# Patient Record
Sex: Female | Born: 1945 | Race: White | Hispanic: No | Marital: Married | Smoking: Former smoker
Health system: Southern US, Community
[De-identification: ages and names within clinical notes are randomized; demographics above are authoritative.]

## PROBLEM LIST (undated history)

## (undated) DIAGNOSIS — E11319 Type 2 diabetes mellitus with unspecified diabetic retinopathy without macular edema: Secondary | ICD-10-CM

## (undated) DIAGNOSIS — J45909 Unspecified asthma, uncomplicated: Secondary | ICD-10-CM

## (undated) DIAGNOSIS — H3500 Unspecified background retinopathy: Secondary | ICD-10-CM

## (undated) DIAGNOSIS — G459 Transient cerebral ischemic attack, unspecified: Secondary | ICD-10-CM

## (undated) DIAGNOSIS — R251 Tremor, unspecified: Secondary | ICD-10-CM

## (undated) DIAGNOSIS — R0683 Snoring: Secondary | ICD-10-CM

## (undated) DIAGNOSIS — G4751 Confusional arousals: Secondary | ICD-10-CM

## (undated) DIAGNOSIS — F32A Depression, unspecified: Secondary | ICD-10-CM

## (undated) DIAGNOSIS — F329 Major depressive disorder, single episode, unspecified: Secondary | ICD-10-CM

## (undated) DIAGNOSIS — F419 Anxiety disorder, unspecified: Secondary | ICD-10-CM

## (undated) HISTORY — DX: Transient cerebral ischemic attack, unspecified: G45.9

## (undated) HISTORY — DX: Confusional arousals: G47.51

## (undated) HISTORY — DX: Type 2 diabetes mellitus with unspecified diabetic retinopathy without macular edema: E11.319

## (undated) HISTORY — DX: Unspecified asthma, uncomplicated: J45.909

## (undated) HISTORY — DX: Tremor, unspecified: R25.1

## (undated) HISTORY — PX: OTHER SURGICAL HISTORY: SHX169

## (undated) HISTORY — PX: CATARACT EXTRACTION, BILATERAL: SHX1313

## (undated) HISTORY — PX: COLONOSCOPY: SHX174

## (undated) HISTORY — DX: Snoring: R06.83

---

## 1998-03-01 ENCOUNTER — Ambulatory Visit (HOSPITAL_COMMUNITY): Admission: RE | Admit: 1998-03-01 | Discharge: 1998-03-01 | Payer: Self-pay | Admitting: Endocrinology

## 1998-03-24 ENCOUNTER — Other Ambulatory Visit: Admission: RE | Admit: 1998-03-24 | Discharge: 1998-03-24 | Payer: Self-pay | Admitting: *Deleted

## 1999-02-27 ENCOUNTER — Other Ambulatory Visit: Admission: RE | Admit: 1999-02-27 | Discharge: 1999-02-27 | Payer: Self-pay | Admitting: *Deleted

## 1999-05-04 ENCOUNTER — Emergency Department (HOSPITAL_COMMUNITY): Admission: RE | Admit: 1999-05-04 | Discharge: 1999-05-04 | Payer: Self-pay | Admitting: Endocrinology

## 1999-05-04 ENCOUNTER — Encounter: Payer: Self-pay | Admitting: Endocrinology

## 2000-01-23 ENCOUNTER — Encounter: Payer: Self-pay | Admitting: Endocrinology

## 2000-01-23 ENCOUNTER — Ambulatory Visit (HOSPITAL_COMMUNITY): Admission: RE | Admit: 2000-01-23 | Discharge: 2000-01-23 | Payer: Self-pay | Admitting: Endocrinology

## 2000-05-29 ENCOUNTER — Other Ambulatory Visit: Admission: RE | Admit: 2000-05-29 | Discharge: 2000-05-29 | Payer: Self-pay | Admitting: *Deleted

## 2001-03-24 ENCOUNTER — Ambulatory Visit (HOSPITAL_COMMUNITY): Admission: RE | Admit: 2001-03-24 | Discharge: 2001-03-24 | Payer: Self-pay | Admitting: Endocrinology

## 2001-03-24 ENCOUNTER — Encounter: Payer: Self-pay | Admitting: Endocrinology

## 2001-07-24 ENCOUNTER — Encounter (HOSPITAL_BASED_OUTPATIENT_CLINIC_OR_DEPARTMENT_OTHER): Admission: RE | Admit: 2001-07-24 | Discharge: 2001-10-22 | Payer: Self-pay | Admitting: Internal Medicine

## 2002-08-10 ENCOUNTER — Encounter (HOSPITAL_BASED_OUTPATIENT_CLINIC_OR_DEPARTMENT_OTHER): Admission: RE | Admit: 2002-08-10 | Discharge: 2002-10-27 | Payer: Self-pay | Admitting: Internal Medicine

## 2002-08-10 ENCOUNTER — Encounter: Admission: RE | Admit: 2002-08-10 | Discharge: 2002-08-10 | Payer: Self-pay | Admitting: Endocrinology

## 2002-08-10 ENCOUNTER — Encounter: Payer: Self-pay | Admitting: Endocrinology

## 2002-09-29 ENCOUNTER — Observation Stay (HOSPITAL_COMMUNITY): Admission: AD | Admit: 2002-09-29 | Discharge: 2002-09-30 | Payer: Self-pay | Admitting: Endocrinology

## 2002-10-28 ENCOUNTER — Encounter (HOSPITAL_BASED_OUTPATIENT_CLINIC_OR_DEPARTMENT_OTHER): Admission: RE | Admit: 2002-10-28 | Discharge: 2003-01-26 | Payer: Self-pay | Admitting: Internal Medicine

## 2003-07-18 ENCOUNTER — Emergency Department (HOSPITAL_COMMUNITY): Admission: EM | Admit: 2003-07-18 | Discharge: 2003-07-18 | Payer: Self-pay | Admitting: Emergency Medicine

## 2004-01-05 ENCOUNTER — Encounter (HOSPITAL_BASED_OUTPATIENT_CLINIC_OR_DEPARTMENT_OTHER): Admission: RE | Admit: 2004-01-05 | Discharge: 2004-01-17 | Payer: Self-pay | Admitting: Internal Medicine

## 2004-02-25 ENCOUNTER — Other Ambulatory Visit: Admission: RE | Admit: 2004-02-25 | Discharge: 2004-02-25 | Payer: Self-pay | Admitting: Obstetrics and Gynecology

## 2004-08-07 ENCOUNTER — Ambulatory Visit (HOSPITAL_COMMUNITY): Admission: RE | Admit: 2004-08-07 | Discharge: 2004-08-07 | Payer: Self-pay | Admitting: Gastroenterology

## 2004-08-12 ENCOUNTER — Emergency Department (HOSPITAL_COMMUNITY): Admission: EM | Admit: 2004-08-12 | Discharge: 2004-08-12 | Payer: Self-pay | Admitting: Emergency Medicine

## 2004-12-18 ENCOUNTER — Ambulatory Visit (HOSPITAL_COMMUNITY): Admission: RE | Admit: 2004-12-18 | Discharge: 2004-12-18 | Payer: Self-pay | Admitting: Endocrinology

## 2005-01-22 ENCOUNTER — Encounter (HOSPITAL_BASED_OUTPATIENT_CLINIC_OR_DEPARTMENT_OTHER): Admission: RE | Admit: 2005-01-22 | Discharge: 2005-02-02 | Payer: Self-pay | Admitting: Surgery

## 2005-01-23 ENCOUNTER — Ambulatory Visit (HOSPITAL_COMMUNITY): Admission: RE | Admit: 2005-01-23 | Discharge: 2005-01-23 | Payer: Self-pay | Admitting: Internal Medicine

## 2006-07-02 ENCOUNTER — Encounter: Admission: RE | Admit: 2006-07-02 | Discharge: 2006-07-02 | Payer: Self-pay | Admitting: Endocrinology

## 2010-03-04 ENCOUNTER — Encounter: Payer: Self-pay | Admitting: Endocrinology

## 2010-06-30 NOTE — H&P (Signed)
Leslie Mccann, Leslie Mccann                           ACCOUNT NO.:  [id number]   MEDICAL RECORD NO.:  [id number]                   PATIENT TYPE:  INP   LOCATION:  5705                                 FACILITY:  [facility]   PHYSICIAN:  Gwen Pounds, M.D.                 DATE OF BIRTH:  [birth year]   DATE OF ADMISSION:  09/29/2002  DATE OF DISCHARGE:                                HISTORY & PHYSICAL   CHIEF COMPLAINT:  Intractable nausea, vomiting, dehydration, dizziness, and  orthostasis.   HISTORY OF PRESENT ILLNESS:  This is a 65 year old type 1 diabetic female  and other medical issues who presents with 36 hours of dizziness with any  position change and intractable nausea with vomiting, three or four episodes  today.  She has not been able to keep anything down today.  She vomited 30  minutes after a half of sandwich today.  The vomiting was not bilious or  bloody.  Her blood sugars have remained between 100 and 300.  She does not  have fruity breath.  She had pork on Sunday night, no one else got ill off  of it.  She is worried because she was at the beach last week and was having  ear fullness since and dizziness since yesterday a.m.  Again, the dizziness  is especially with any motion changes.  She denies any diarrhea, chest pain,  or other symptoms.  She is brought to the exam room via wheelchair due to  weakness.   PAST MEDICAL HISTORY:  1. Type 1 diabetes.  2. Retinopathy.  3. Neuropathy.  4. Radiation treatments to the right neck to a birthmark.  5. Hypoglycemic seizures.  6. Osteopenia.  7. Adhesive capsulitis.  8. Abnormal LFTs in the past.  9. Depression.  10.      Hyperlipidemia.  11.      History of exploratory laparotomy.  12.      History of a GI bleed.  13.      Goiter.  14.      Anemia.   ALLERGIES:  SULFA.   SOCIAL HISTORY:  She is married.  Does not smoke, does not drink.   FAMILY HISTORY:  COPD, bladder cancer, thyroid cancer.   MEDICATIONS:  1. Lantus  16 h.s.  2. Aspirin 81 daily.  3. Evista 60 daily.  4. Calcium two daily.  5. Paxil CR 25 daily.  6. Tranxene.  7. Multivitamin daily.   REVIEW OF SYSTEMS:  Otherwise negative, except for weakness, dry mouth,  nausea, vomiting, no diarrhea, dizziness, or orthostasis.   PHYSICAL EXAMINATION:  VITAL SIGNS:  Temperature 97.3.  She is orthostatic  with blood pressure at 140/80 laying, with heart rate 80; while sitting,  112/66, with heart rate 76; while standing, blood pressure 110/66, with  heart rate 88.  Blood sugar is 156.  GENERAL:  Alert and oriented x3.  When laying down appears okay.  Dizzy and  holding onto things when sitting up.  HEENT:  Dry mouth.  PERRLA.  EOMI.  Throat and tympanic membranes are clear.  Nasal passages are clear.  NECK:  No JVD, no bruit, no lymphadenopathy or thyromegaly noted.  Scar to  the right neck.  CARDIAC:  Regular without murmurs.  PULMONARY:  Clear to auscultation bilaterally.  SKIN:  Without rash.  ABDOMEN:  Soft, nontender, nondistended, bowel sounds positive.  EXTREMITIES:  No cyanosis, clubbing, or edema.  NEUROLOGIC:  Intact.  No fruity breath noted.  No nystagmus noted.   ASSESSMENT AND PLAN:  This is a type 1 diabetic female with orthostatic  hypotension, nausea, vomiting, and dehydration.   PLAN:  1. Admit for 23 hour observation.  2. Check labs to rule out any underlying disorder and rule out pancreatitis.  3. Hydrate.  4. Antiemetics for settling her stomach.  5. Meclizine for dizziness.  6. Control diabetes.  7. Hopefully home in the morning.  8. Underlying issue may be vertigo versus gastroenteritis versus other.                                                Gwen Pounds, M.D.    JMR/MEDQ  D:  09/29/2002  T:  09/29/2002  Job:  [id number]   cc:   Jeannett Senior A. Evlyn Kanner, M.D.  [address]  [place]  [place] [address]  Fax: [phone]

## 2010-06-30 NOTE — Discharge Summary (Signed)
NAMECASSADY, Leslie Mccann                           ACCOUNT NO.:  [id number]   MEDICAL RECORD NO.:  [id number]                   PATIENT TYPE:  OBV   LOCATION:  5705                                 FACILITY:  [facility]   PHYSICIAN:  Tera Mater. Evlyn Kanner, M.D.              DATE OF BIRTH:  13-Jun-1945   DATE OF ADMISSION:  09/29/2002  DATE OF DISCHARGE:  09/30/2002                                 DISCHARGE SUMMARY   DISCHARGE DIAGNOSES:  1. Orthostatic hypotension due to dehydration, clinically improved.  2. Viral labyrinthitis, clinically much improved.  3. Type 1 diabetes, no evidence of ketoacidosis.  4. Neuropathy.  5. Toe ulcer without acute infection.  6. Retinopathy.  7. Osteopenia.  8. History of abnormal liver function tests now resolved.  9. Dyslipidemia.  10.      Goiter, clinically euthyroid.  11.      Anemia.  12.      History of severe depression.   HOSPITAL COURSE:  Ms. Trammel is a 65 year old white female with a long  history of type 1 diabetes, dating back to 71.  She had 36 hours of  nausea, vomiting, dizziness, and unstable gait.  She was found to be quite  orthostatic and volume depleted.  Her evaluation and treatment here has  included fluid resuscitation and treatment with meclozine and Phenergan.  She has not even needed the meclozine today.  She is feeling back close to  her baseline.  With minimal help, she got to the bathroom with no dizziness  and no fear of falling.  Her gait is much more steady.  Her electrolytes  were in relatively good control with no evidence of significant disarray.  Specifically, there was no evidence of any diabetic ketoacidosis  precipitating this.  This appears to have been a straight forward viral  labyrinthitis complicating a person who already has a history of  longstanding diabetes and its complications.  At the present time, her blood  pressure is still somewhat orthostatically dropping but not to a degree that  it would precipitate  or continue a hospitalization.  I believe, she has  reached maximal hospital benefit and can go home.  Blood pressure today was  114/47 with pulse 77, dropping to 105/55 with pulse of 83 sitting and 89/50  with pulse 89 standing.  Her examination is otherwise unremarkable.  There  are no neurologic deficits on today's examination.  The toe ulcer is small  without an acute infection.  She is tolerating her diet fairly well.  Blood  sugar started today at 111.  It was up a bit more at lunch.   LABORATORY DATA:  White count 8600, hemoglobin 13.0, MCV 98.2, platelets  229,000.  Sodium 133, potassium 3.5, chloride 99, CO2 22, BUN 20, creatinine  0.8, glucose 316.  Total bilirubin 1.0, alkaline phosphatase 97, SGOT 23,  SGPT 26, total protein 6.1, albumin 3.5, calcium  8.7, amylase 60, lipase 13.  No cardiology or radiology tests were done.   In summary, we have a 65 year old white female with known type 1 diabetes  presenting with viral labyrinthitis, orthostatic hypotension and  dehydration.  She is clinically much improved and is ready for discharge  home.  Blood sugars were up just a bit today due to her dose being adjusted  downward.   At home, she is to resume her Lantus 60 units at bedtime and aspirin 81 mg  daily, Evista 60 daily, calcium two pills a day, Paxil CR 25 daily,  Tranxene, multivitamin, sliding scale insulin.  The two medications are  meclozine 25 mg t.i.d. p.r.n. and Phenergan suppository 25 mg q.8h. p.r.n.                                                Tera Mater. Evlyn Kanner, M.D.    SAS/MEDQ  D:  09/30/2002  T:  10/01/2002  Job:  [id number]

## 2010-06-30 NOTE — Op Note (Signed)
Leslie Mccann, Leslie Mccann                 ACCOUNT NO.:  [id number]   MEDICAL RECORD NO.:  [id number]          PATIENT TYPE:  AMB   LOCATION:  ENDO                         [facility]ACILITY:  [facility]   PHYSICIAN:  James L. Malon Kindle., M.D.DATE OF BIRTH:  11-28-1945   DATE OF PROCEDURE:  08/07/2004  DATE OF DISCHARGE:                                 OPERATIVE REPORT   PROCEDURE:  Colonoscopy.   MEDICATIONS:  1.  Fentanyl 100 mcg.  2.  Versed 10 mg IV.   SCOPE:  Pediatric adjustable colonoscope.   INDICATION:  Colon cancer screening.   DESCRIPTION OF PROCEDURE:  The procedure explained to the patient and  consent obtained.  In the left lateral decubitus position, digital exam  performed, pediatric adjustable scope inserted and advanced, prep excellent,  cecum reached, ileocecal valve and appendiceal orifice seen.  Scope  withdrawn, the patient carefully examined.  No polyps throughout.  No  diverticulosis.  Rectum, __________ seen well on retroflexed view.  Small  internal hemorrhoids.  Scope withdrawn.  The patient tolerated the procedure  well.   ASSESSMENT:  Normal screening colonoscopy.  V76.51.   PLAN:  We will recommend yearly Hemoccults, resume her insulin today.  Possible repeat colonoscopy in 10 years.       JLE/MEDQ  D:  08/07/2004  T:  08/07/2004  Job:  [id number]   cc:   Dineen Kid. Rana Snare, M.D.  [address]  [place]  [place] [address]  Fax: [phone]   Tera Mater. Evlyn Kanner, M.D.  [address]  [place]  [place] [address]  Fax: [phone]

## 2011-05-09 ENCOUNTER — Emergency Department (HOSPITAL_COMMUNITY): Payer: Medicare Other

## 2011-05-09 ENCOUNTER — Encounter (HOSPITAL_COMMUNITY): Payer: Self-pay | Admitting: Emergency Medicine

## 2011-05-09 ENCOUNTER — Emergency Department (HOSPITAL_COMMUNITY)
Admission: EM | Admit: 2011-05-09 | Discharge: 2011-05-09 | Disposition: A | Discharge status: Home / Self Care | Payer: Medicare Other | Attending: Emergency Medicine | Admitting: Emergency Medicine

## 2011-05-09 DIAGNOSIS — R51 Headache: Secondary | ICD-10-CM | POA: Insufficient documentation

## 2011-05-09 DIAGNOSIS — R220 Localized swelling, mass and lump, head: Secondary | ICD-10-CM | POA: Insufficient documentation

## 2011-05-09 DIAGNOSIS — R221 Localized swelling, mass and lump, neck: Secondary | ICD-10-CM | POA: Insufficient documentation

## 2011-05-09 DIAGNOSIS — F29 Unspecified psychosis not due to a substance or known physiological condition: Secondary | ICD-10-CM | POA: Insufficient documentation

## 2011-05-09 DIAGNOSIS — Z7982 Long term (current) use of aspirin: Secondary | ICD-10-CM | POA: Insufficient documentation

## 2011-05-09 DIAGNOSIS — E119 Type 2 diabetes mellitus without complications: Secondary | ICD-10-CM | POA: Insufficient documentation

## 2011-05-09 DIAGNOSIS — S0990XA Unspecified injury of head, initial encounter: Secondary | ICD-10-CM | POA: Insufficient documentation

## 2011-05-09 DIAGNOSIS — Z79899 Other long term (current) drug therapy: Secondary | ICD-10-CM | POA: Insufficient documentation

## 2011-05-09 DIAGNOSIS — F341 Dysthymic disorder: Secondary | ICD-10-CM | POA: Insufficient documentation

## 2011-05-09 DIAGNOSIS — W2209XA Striking against other stationary object, initial encounter: Secondary | ICD-10-CM | POA: Insufficient documentation

## 2011-05-09 DIAGNOSIS — Z794 Long term (current) use of insulin: Secondary | ICD-10-CM | POA: Insufficient documentation

## 2011-05-09 HISTORY — DX: Unspecified background retinopathy: H35.00

## 2011-05-09 HISTORY — DX: Anxiety disorder, unspecified: F41.9

## 2011-05-09 HISTORY — DX: Depression, unspecified: F32.A

## 2011-05-09 HISTORY — DX: Major depressive disorder, single episode, unspecified: F32.9

## 2011-05-09 LAB — GLUCOSE, CAPILLARY: Glucose-Capillary: 174 mg/dL — ABNORMAL HIGH (ref 70–99)

## 2011-05-09 NOTE — Discharge Instructions (Signed)
Leslie Mccann, you had physical examination and CT x-ray of the brain to check on you after you bumped your head on a glass door this afternoon.  Fortunately, your examination and CT scan were good.  It is safe to go home.  You could take Tylenol or Advil if needed for pain.

## 2011-05-09 NOTE — ED Notes (Signed)
Pt states she was walking very fast and ran into a glass door  Pt states she hit the right side of her face and head  Family member states when she got to the car she was crying and states her jaw hurts  Pt was leaving [facility]  Pt states since the incident she has felt groggy and sleepy  Denies LOC

## 2011-05-09 NOTE — ED Notes (Signed)
Patient given discharge instructions, information, prescriptions, and diet order. Patient states that they adequately understand discharge information given and to return to ED if symptoms return or worsen.     

## 2011-05-09 NOTE — ED Notes (Signed)
Pt sts she is woozy and cranky. Sts she has not been to sleep either. No bumps or bruising noted upon inspection of patient. Patient sts there is pain upon palpation of the right side of her head, above her ear. Sts her jaw is hurting, jaw is aligned and there is no pain when pt opens and closes mouth.

## 2011-05-10 NOTE — ED Provider Notes (Signed)
History     CSN: [id number]  Arrival date & time 05/09/11  7829   First MD Initiated Contact with Patient 05/09/11 2123      Chief Complaint  Patient presents with  . Head Injury    (Consider location/radiation/quality/duration/timing/severity/associated sxs/prior treatment) Patient is a 66 y.o. female presenting with head injury. The history is provided by the patient and the spouse. No language interpreter was used.  Head Injury  The incident occurred 3 to 5 hours ago. She came to the ER via walk-in. The injury mechanism was a direct blow (Pt accidentally walked into a glass door striking the right orbital and right temporal region.  She has  felt groggy and "out of it" since the injury several hours ago.). There was no loss of consciousness. There was no blood loss. The quality of the pain is described as throbbing. The pain is at a severity of 3/10. The pain is mild. The pain has been constant since the injury. Associated symptoms include disorientation. Pertinent negatives include no vomiting. She has tried nothing for the symptoms.    Past Medical History  Diagnosis Date  . Diabetes mellitus   . Depression   . Anxiety   . Retinopathy     Past Surgical History  Procedure Date  . Eye surgeries    . Cataract extraction, bilateral   . Colonoscopy     Family History  Problem Relation Age of Onset  . Cancer Mother   . COPD Father   . Diabetes Other   . Hypertension Other     History  Substance Use Topics  . Smoking status: Never Smoker   . Smokeless tobacco: Not on file  . Alcohol Use: Yes     occassional     OB History    Grav Para Term Preterm Abortions TAB SAB Ect Mult Living                  Review of Systems  Constitutional: Negative.  Negative for fever and chills.  HENT: Positive for facial swelling. Negative for nosebleeds.   Eyes: Negative for visual disturbance.  Respiratory: Negative.   Cardiovascular: Negative.   Gastrointestinal: Negative.   Negative for vomiting.  Genitourinary: Negative.   Musculoskeletal: Negative.   Neurological: Positive for headaches.  Psychiatric/Behavioral: Negative.     Allergies  Codeine; Sulfa antibiotics; and Adhesive  Home Medications   Current Outpatient Rx  Name Route Sig Dispense Refill  . ARIPIPRAZOLE 2 MG PO TABS Oral Take 2 mg by mouth daily.     . ASPIRIN 81 MG PO TABS Oral Take 81 mg by mouth daily.    Marland Kitchen FLUOXETINE HCL 40 MG PO CAPS Oral Take 40 mg by mouth daily.    . INSULIN ASPART 100 UNIT/ML [place] SOLN Subcutaneous Inject 4-8 Units into the skin See admin instructions. 4 to 8 units per sliding scale three times daily with meals.    . INSULIN GLARGINE 100 UNIT/ML [place] SOLN Subcutaneous Inject 12 Units into the skin at bedtime.    Marland Kitchen LEVOTHYROXINE SODIUM 100 MCG PO TABS Oral Take 100 mcg by mouth daily.    Marland Kitchen LORAZEPAM 1 MG PO TABS Oral Take 1 mg by mouth 2 (two) times daily after a meal.    . FISH OIL 1000 MG PO CAPS Oral Take 1 capsule by mouth daily.    Marland Kitchen RALOXIFENE HCL 60 MG PO TABS Oral Take 60 mg by mouth daily.      BP 125/57  Pulse 84  Temp(Src) 98.3 F (36.8 C) (Oral)  Resp 16  SpO2 97%  Physical Exam  Constitutional: She is oriented to person, place, and time. She appears well-developed and well-nourished. No distress (There is not visible or palpable deformity of the skull or facial bones.  There is no ecchymosis or swelling. ).  HENT:  Head: Normocephalic and atraumatic.  Eyes: Conjunctivae and EOM are normal. Pupils are equal, round, and reactive to light.  Neck: Normal range of motion. Neck supple.  Cardiovascular: Normal rate, regular rhythm and normal heart sounds.   Pulmonary/Chest: Effort normal and breath sounds normal.  Abdominal: Soft. Bowel sounds are normal.  Musculoskeletal: Normal range of motion.  Neurological: She is alert and oriented to person, place, and time. She has normal reflexes.       No sensory or motor deficits.  Skin: Skin is warm and dry.   Psychiatric: She has a normal mood and affect. Her behavior is normal.    ED Course  Procedures (including critical care time)  Labs Reviewed  GLUCOSE, CAPILLARY - Abnormal; Notable for the following:    Glucose-Capillary 174 (*)    All other components within normal limits  LAB REPORT - SCANNED   Ct Head Wo Contrast  05/09/2011  *RADIOLOGY REPORT*  Clinical Data: 66 year old female with headache and dizziness following head injury.  CT HEAD WITHOUT CONTRAST  Technique:  Contiguous axial images were obtained from the base of the skull through the vertex without contrast.  Comparison: None  Findings: No acute intracranial abnormalities are identified, including mass lesion or mass effect, hydrocephalus, extra-axial fluid collection, midline shift, hemorrhage, or acute infarction.  The visualized bony calvarium is unremarkable.  IMPRESSION: No evidence of acute intracranial abnormality.  Original Report Authenticated By: Rosendo Gros, M.D.     1. Head injury without concussion or intracranial hemorrhage    Exam was normal and head CT was negative.  Reassured and released.      Carleene Cooper III, MD 05/10/11 1159

## 2012-08-02 ENCOUNTER — Encounter (HOSPITAL_COMMUNITY): Payer: Self-pay | Admitting: *Deleted

## 2012-08-02 ENCOUNTER — Emergency Department (HOSPITAL_COMMUNITY): Payer: Medicare Other

## 2012-08-02 ENCOUNTER — Emergency Department (HOSPITAL_COMMUNITY)
Admission: EM | Admit: 2012-08-02 | Discharge: 2012-08-02 | Disposition: A | Discharge status: Home / Self Care | Payer: Medicare Other | Attending: Emergency Medicine | Admitting: Emergency Medicine

## 2012-08-02 DIAGNOSIS — Z79899 Other long term (current) drug therapy: Secondary | ICD-10-CM | POA: Insufficient documentation

## 2012-08-02 DIAGNOSIS — S0993XA Unspecified injury of face, initial encounter: Secondary | ICD-10-CM | POA: Insufficient documentation

## 2012-08-02 DIAGNOSIS — S0990XA Unspecified injury of head, initial encounter: Secondary | ICD-10-CM | POA: Insufficient documentation

## 2012-08-02 DIAGNOSIS — Z7982 Long term (current) use of aspirin: Secondary | ICD-10-CM | POA: Insufficient documentation

## 2012-08-02 DIAGNOSIS — E119 Type 2 diabetes mellitus without complications: Secondary | ICD-10-CM | POA: Insufficient documentation

## 2012-08-02 DIAGNOSIS — Y929 Unspecified place or not applicable: Secondary | ICD-10-CM | POA: Insufficient documentation

## 2012-08-02 DIAGNOSIS — F3289 Other specified depressive episodes: Secondary | ICD-10-CM | POA: Insufficient documentation

## 2012-08-02 DIAGNOSIS — W19XXXA Unspecified fall, initial encounter: Secondary | ICD-10-CM

## 2012-08-02 DIAGNOSIS — Z794 Long term (current) use of insulin: Secondary | ICD-10-CM | POA: Insufficient documentation

## 2012-08-02 DIAGNOSIS — F329 Major depressive disorder, single episode, unspecified: Secondary | ICD-10-CM | POA: Insufficient documentation

## 2012-08-02 DIAGNOSIS — M542 Cervicalgia: Secondary | ICD-10-CM | POA: Insufficient documentation

## 2012-08-02 DIAGNOSIS — Y9389 Activity, other specified: Secondary | ICD-10-CM | POA: Insufficient documentation

## 2012-08-02 DIAGNOSIS — R5381 Other malaise: Secondary | ICD-10-CM | POA: Insufficient documentation

## 2012-08-02 DIAGNOSIS — W1809XA Striking against other object with subsequent fall, initial encounter: Secondary | ICD-10-CM | POA: Insufficient documentation

## 2012-08-02 DIAGNOSIS — F411 Generalized anxiety disorder: Secondary | ICD-10-CM | POA: Insufficient documentation

## 2012-08-02 DIAGNOSIS — Z8669 Personal history of other diseases of the nervous system and sense organs: Secondary | ICD-10-CM | POA: Insufficient documentation

## 2012-08-02 DIAGNOSIS — R42 Dizziness and giddiness: Secondary | ICD-10-CM | POA: Insufficient documentation

## 2012-08-02 LAB — CBC WITH DIFFERENTIAL/PLATELET
Basophils Absolute: 0 10*3/uL (ref 0.0–0.1)
Eosinophils Relative: 2 % (ref 0–5)
HCT: 36.5 % (ref 36.0–46.0)
Hemoglobin: 12.5 g/dL (ref 12.0–15.0)
Lymphocytes Relative: 23 % (ref 12–46)
Lymphs Abs: 1.1 10*3/uL (ref 0.7–4.0)
MCV: 92.9 fL (ref 78.0–100.0)
Monocytes Absolute: 0.5 10*3/uL (ref 0.1–1.0)
Neutro Abs: 3 10*3/uL (ref 1.7–7.7)
RBC: 3.93 MIL/uL (ref 3.87–5.11)
RDW: 12.8 % (ref 11.5–15.5)
WBC: 4.8 10*3/uL (ref 4.0–10.5)

## 2012-08-02 LAB — GLUCOSE, CAPILLARY: Glucose-Capillary: 160 mg/dL — ABNORMAL HIGH (ref 70–99)

## 2012-08-02 LAB — URINALYSIS, ROUTINE W REFLEX MICROSCOPIC
Bilirubin Urine: NEGATIVE
Glucose, UA: 100 mg/dL — AB
Ketones, ur: 40 mg/dL — AB
Leukocytes, UA: NEGATIVE
Nitrite: NEGATIVE
Specific Gravity, Urine: 1.021 (ref 1.005–1.030)
pH: 6.5 (ref 5.0–8.0)

## 2012-08-02 LAB — BASIC METABOLIC PANEL
CO2: 27 mEq/L (ref 19–32)
Chloride: 96 mEq/L (ref 96–112)
GFR calc Af Amer: >90 mL/min (ref >90)
Potassium: 4.5 mEq/L (ref 3.5–5.1)
Sodium: 131 mEq/L — ABNORMAL LOW (ref 135–145)

## 2012-08-02 MED ORDER — SODIUM CHLORIDE 0.9 % IV SOLN: Freq: Once | INTRAVENOUS | Status: DC

## 2012-08-02 NOTE — ED Notes (Signed)
Pt reports she has been lightheaded and dizzy when she stands up. States this has been going on for the past 3 weeks

## 2012-08-02 NOTE — ED Notes (Signed)
Pt reports last night she was in her bed room and tried to sit on bed (in dark) pt fell on back and hit back of head. Husband witnessed fall. Denies LOC. Denies pain or n/v. Pt is a+ox4

## 2012-08-02 NOTE — ED Provider Notes (Signed)
History     CSN: [id number]  Arrival date & time 08/02/12  1421   First MD Initiated Contact with Patient 08/02/12 1456      Chief Complaint  Patient presents with  . Fall    (Consider location/radiation/quality/duration/timing/severity/associated sxs/prior treatment) HPI Comments: Patient is a 67 yo F PMHx significant for DM, Depression, Anxiety presenting to the ED for three weeks of worsening generalized fatigue and lightheadedness w/ a fall that occurred this morning around 3AM. Patient states she tried to get back into bed last evening when she missed the bed, fell back, and hit her head on the floor w/o LOC. The patient's husband witnessed the fall and endorses no LOC. Patient continues to have mild non-radiating posterior head and neck pain today. Patient denies any chest pain or palpitations prior to fall. Husband states he has noticed patient has been more unsteady on her feet particularly in the AM over the last three weeks. Patient was evaluated by her endocrinologist yesterday regarding three weeks of lightheadedness w/ no definitive diagnosis at this time. Patient does endorse that she has been trying to diet to lose weight, but also endorses that she has adjusted her insulin dosing. Denies CP, SOB, nausea, vomiting, syncope, neurofocal deficits.   Patient is a 67 y.o. female presenting with fall.  Fall Associated symptoms include fatigue, headaches and neck pain. Pertinent negatives include no abdominal pain, chest pain, coughing, nausea or vomiting.    Past Medical History  Diagnosis Date  . Diabetes mellitus   . Depression   . Anxiety   . Retinopathy     Past Surgical History  Procedure Laterality Date  . Eye surgeries     . Cataract extraction, bilateral    . Colonoscopy      Family History  Problem Relation Age of Onset  . Cancer Mother   . COPD Father   . Diabetes Other   . Hypertension Other     History  Substance Use Topics  . Smoking status: Never  Smoker   . Smokeless tobacco: Not on file  . Alcohol Use: Yes     Comment: occassional     OB History   Grav Para Term Preterm Abortions TAB SAB Ect Mult Living                  Review of Systems  Constitutional: Positive for fatigue.       Generalized weakness  HENT: Positive for neck pain.   Eyes: Negative for visual disturbance.  Respiratory: Negative for cough and shortness of breath.   Cardiovascular: Negative for chest pain and palpitations.  Gastrointestinal: Negative for nausea, vomiting and abdominal pain.  Neurological: Positive for light-headedness and headaches. Negative for syncope and speech difficulty.    Allergies  Codeine; Sulfa antibiotics; and Adhesive  Home Medications   Current Outpatient Rx  Name  Route  Sig  Dispense  Refill  . ARIPiprazole (ABILIFY) 2 MG tablet   Oral   Take 2 mg by mouth daily.          Marland Kitchen aspirin 81 MG tablet   Oral   Take 81 mg by mouth daily.         . B Complex-C (B-COMPLEX WITH VITAMIN C) tablet   Oral   Take 1 tablet by mouth daily.         Marland Kitchen buPROPion (WELLBUTRIN XL) 300 MG 24 hr tablet   Oral   Take 300 mg by mouth daily.         Marland Kitchen  FLUoxetine (PROZAC) 40 MG capsule   Oral   Take 40 mg by mouth daily.         . insulin glargine (LANTUS) 100 UNIT/ML injection   Subcutaneous   Inject 12 Units into the skin at bedtime.         . insulin lispro (HUMALOG) 100 UNIT/ML injection   Subcutaneous   Inject 4-8 Units into the skin 3 (three) times daily before meals.         Marland Kitchen levothyroxine (SYNTHROID, LEVOTHROID) 100 MCG tablet   Oral   Take 100 mcg by mouth daily.         Marland Kitchen LORazepam (ATIVAN) 1 MG tablet   Oral   Take 0.5-1 mg by mouth every 8 (eight) hours as needed. Takes .5mg  in the morning, .5mg  in the afternoon and 1mg  at bedtime         . raloxifene (EVISTA) 60 MG tablet   Oral   Take 60 mg by mouth daily.         . Vitamin D, Ergocalciferol, (DRISDOL) 50000 UNITS CAPS   Oral   Take  50,000 Units by mouth every 7 (seven) days. Every tuesday           BP 151/72  Pulse 77  Temp(Src) 98.5 F (36.9 C) (Oral)  Resp 20  SpO2 98%  Physical Exam  Constitutional: She is oriented to person, place, and time. She appears well-developed and well-nourished. No distress.  HENT:  Head: Normocephalic and atraumatic.  Mouth/Throat: Oropharynx is clear and moist.  Eyes: Conjunctivae and EOM are normal. Pupils are equal, round, and reactive to light.  Neck: Normal range of motion. Neck supple.  Cardiovascular: Normal rate, regular rhythm, normal heart sounds and intact distal pulses.   Pulmonary/Chest: Effort normal and breath sounds normal. No respiratory distress.  Abdominal: Soft. Bowel sounds are normal.  Neurological: She is alert and oriented to person, place, and time. She has normal strength. No cranial nerve deficit or sensory deficit. She displays a negative Romberg sign.  No Pronator drift. Patient very unsteady while ambulating  Skin: Skin is warm and dry. No rash noted. She is not diaphoretic.  Psychiatric: She has a normal mood and affect.    ED Course  Procedures (including critical care time)   Date: 08/02/2012  Rate: 78  Rhythm: normal sinus rhythm  QRS Axis: normal  Intervals: borderline QT prolongation  ST/T Wave abnormalities: normal  Conduction Disutrbances:none  Narrative Interpretation:   Old EKG Reviewed: none available    Labs Reviewed  BASIC METABOLIC PANEL - Abnormal; Notable for the following:    Sodium 131 (*)    Glucose, Bld 188 (*)    All other components within normal limits  URINALYSIS, ROUTINE W REFLEX MICROSCOPIC - Abnormal; Notable for the following:    Glucose, UA 100 (*)    Ketones, ur 40 (*)    All other components within normal limits  GLUCOSE, CAPILLARY - Abnormal; Notable for the following:    Glucose-Capillary 160 (*)    All other components within normal limits  CBC WITH DIFFERENTIAL   Ct Head Wo  Contrast  08/02/2012   *RADIOLOGY REPORT*  Clinical Data:  Larey Seat yesterday and hit back of head.  Head pain. Neck pain.  CT HEAD WITHOUT CONTRAST CT CERVICAL SPINE WITHOUT CONTRAST  Technique:  Multidetector CT imaging of the head and cervical spine was performed following the standard protocol without intravenous contrast.  Multiplanar CT image reconstructions of the cervical spine  were also generated.  Comparison:  05/09/2011.  CT HEAD  Findings: Mild atrophy.  Mild chronic microvascular ischemic change.  No acute stroke or hemorrhage.  No mass lesion or hydrocephalus.  No extra-axial fluid.  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  Small right occipital scalp hematoma.  No sinus or mastoid fluid. Compared with priors, a similar intracranial appearance is noted.  IMPRESSION: Mild atrophy and chronic microvascular ischemic change.  No acute intracranial findings.  No skull fracture.  CT CERVICAL SPINE  Findings: There is severe disc space narrowing at C4-5, C5-C6, and moderate disc space narrowing at C6-7.  There is facet mediated slip of 2-3 mm at C3-C4.  There is no cervical spine fracture or traumatic subluxation.  No intraspinal hematoma or mass.    No prevertebral soft tissue swelling.  Carotid calcification.  No lung apex lesion.  IMPRESSION: Degenerative changes as described.  No acute cervical spine injury is evident.   Original Report Authenticated By: Davonna Belling, M.D.   Ct Cervical Spine Wo Contrast  08/02/2012   *RADIOLOGY REPORT*  Clinical Data:  Larey Seat yesterday and hit back of head.  Head pain. Neck pain.  CT HEAD WITHOUT CONTRAST CT CERVICAL SPINE WITHOUT CONTRAST  Technique:  Multidetector CT imaging of the head and cervical spine was performed following the standard protocol without intravenous contrast.  Multiplanar CT image reconstructions of the cervical spine were also generated.  Comparison:  05/09/2011.  CT HEAD  Findings: Mild atrophy.  Mild chronic microvascular ischemic change.  No  acute stroke or hemorrhage.  No mass lesion or hydrocephalus.  No extra-axial fluid.  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  Small right occipital scalp hematoma.  No sinus or mastoid fluid. Compared with priors, a similar intracranial appearance is noted.  IMPRESSION: Mild atrophy and chronic microvascular ischemic change.  No acute intracranial findings.  No skull fracture.  CT CERVICAL SPINE  Findings: There is severe disc space narrowing at C4-5, C5-C6, and moderate disc space narrowing at C6-7.  There is facet mediated slip of 2-3 mm at C3-C4.  There is no cervical spine fracture or traumatic subluxation.  No intraspinal hematoma or mass.    No prevertebral soft tissue swelling.  Carotid calcification.  No lung apex lesion.  IMPRESSION: Degenerative changes as described.  No acute cervical spine injury is evident.   Original Report Authenticated By: Davonna Belling, M.D.   Tolerated PO fluids.   1. Fall, initial encounter   2. Lightheadedness       MDM  Pt presenting with fall without syncopal episode, CP. No neurovascular deficits on examination. Labs, imaging, EKG reviewed. Pt had mild QTc prolongation (492) without previous EKG to determine if new onset or not. Unlikely cause of patient's symptoms as no history of syncopal event. QTc prolongation most likely cause of female gender and multiple psychiatric medications w/ known side effect. Patient's three weeks of lightheadedness and fatigue most likely related to recent medication changes, dieting, and history of depression. Patient was advised to follow up with PCP regarding today's visit and EKG findings. Patient is agreeable to plan. Patient d/w with Dr. Juleen China, agrees with plan. Patient is stable at time of discharge           Jeannetta Ellis, PA-C 08/02/12 2112

## 2012-08-09 NOTE — ED Provider Notes (Signed)
Medical screening examination/treatment/procedure(s) were performed by non-physician practitioner and as supervising physician I was immediately available for consultation/collaboration.  Kaila Devries, MD 08/09/12 1656 

## 2012-09-20 ENCOUNTER — Encounter (HOSPITAL_COMMUNITY): Payer: Self-pay | Admitting: Emergency Medicine

## 2012-09-20 ENCOUNTER — Emergency Department (HOSPITAL_COMMUNITY)
Admission: EM | Admit: 2012-09-20 | Discharge: 2012-09-21 | Disposition: A | Discharge status: Home / Self Care | Payer: Medicare Other | Attending: Emergency Medicine | Admitting: Emergency Medicine

## 2012-09-20 DIAGNOSIS — Z794 Long term (current) use of insulin: Secondary | ICD-10-CM | POA: Insufficient documentation

## 2012-09-20 DIAGNOSIS — W19XXXA Unspecified fall, initial encounter: Secondary | ICD-10-CM | POA: Insufficient documentation

## 2012-09-20 DIAGNOSIS — F411 Generalized anxiety disorder: Secondary | ICD-10-CM | POA: Insufficient documentation

## 2012-09-20 DIAGNOSIS — Y9389 Activity, other specified: Secondary | ICD-10-CM | POA: Insufficient documentation

## 2012-09-20 DIAGNOSIS — R413 Other amnesia: Secondary | ICD-10-CM | POA: Insufficient documentation

## 2012-09-20 DIAGNOSIS — S0990XA Unspecified injury of head, initial encounter: Secondary | ICD-10-CM | POA: Insufficient documentation

## 2012-09-20 DIAGNOSIS — Z8669 Personal history of other diseases of the nervous system and sense organs: Secondary | ICD-10-CM | POA: Insufficient documentation

## 2012-09-20 DIAGNOSIS — Z7982 Long term (current) use of aspirin: Secondary | ICD-10-CM | POA: Insufficient documentation

## 2012-09-20 DIAGNOSIS — F3289 Other specified depressive episodes: Secondary | ICD-10-CM | POA: Insufficient documentation

## 2012-09-20 DIAGNOSIS — Y929 Unspecified place or not applicable: Secondary | ICD-10-CM | POA: Insufficient documentation

## 2012-09-20 DIAGNOSIS — R296 Repeated falls: Secondary | ICD-10-CM

## 2012-09-20 DIAGNOSIS — F329 Major depressive disorder, single episode, unspecified: Secondary | ICD-10-CM | POA: Insufficient documentation

## 2012-09-20 DIAGNOSIS — Z79899 Other long term (current) drug therapy: Secondary | ICD-10-CM | POA: Insufficient documentation

## 2012-09-20 DIAGNOSIS — E119 Type 2 diabetes mellitus without complications: Secondary | ICD-10-CM | POA: Insufficient documentation

## 2012-09-20 DIAGNOSIS — R4182 Altered mental status, unspecified: Secondary | ICD-10-CM | POA: Insufficient documentation

## 2012-09-20 DIAGNOSIS — F29 Unspecified psychosis not due to a substance or known physiological condition: Secondary | ICD-10-CM | POA: Insufficient documentation

## 2012-09-20 NOTE — ED Notes (Signed)
Pt states she feels her balance has been off, per family she was confused today attempting to take incorrect medication, unsure of time of day. Pt states she has experienced some memory loss. Family feels this may be medication related. S/s x 2 days. Pt is A & O at this time.

## 2012-09-21 ENCOUNTER — Emergency Department (HOSPITAL_COMMUNITY): Payer: Medicare Other

## 2012-09-21 LAB — POCT I-STAT, CHEM 8
Calcium, Ion: 1.25 mmol/L (ref 1.13–1.30)
Chloride: 105 mEq/L (ref 96–112)
HCT: 37 % (ref 36.0–46.0)
Potassium: 3.9 mEq/L (ref 3.5–5.1)
Sodium: 141 mEq/L (ref 135–145)

## 2012-09-21 LAB — URINALYSIS, ROUTINE W REFLEX MICROSCOPIC
Bilirubin Urine: NEGATIVE
Hgb urine dipstick: NEGATIVE
Specific Gravity, Urine: 1.03 (ref 1.005–1.030)
pH: 6 (ref 5.0–8.0)

## 2012-09-21 LAB — CBC WITH DIFFERENTIAL/PLATELET
Basophils Relative: 0 % (ref 0–1)
Eosinophils Absolute: 0.2 10*3/uL (ref 0.0–0.7)
Lymphs Abs: 1.3 10*3/uL (ref 0.7–4.0)
MCH: 33 pg (ref 26.0–34.0)
MCHC: 34.3 g/dL (ref 30.0–36.0)
Neutro Abs: 3 10*3/uL (ref 1.7–7.7)
Neutrophils Relative %: 60 % (ref 43–77)
Platelets: 255 10*3/uL (ref 150–400)
RBC: 3.61 MIL/uL — ABNORMAL LOW (ref 3.87–5.11)

## 2012-09-21 LAB — RAPID URINE DRUG SCREEN, HOSP PERFORMED
Benzodiazepines: NOT DETECTED
Cocaine: NOT DETECTED
Opiates: NOT DETECTED

## 2012-09-21 LAB — TRICYCLICS SCREEN, URINE: TCA Scrn: NOT DETECTED

## 2012-09-21 LAB — ACETAMINOPHEN LEVEL: Acetaminophen (Tylenol), Serum: <15 ug/mL (ref 10–30)

## 2012-09-21 LAB — URINE MICROSCOPIC-ADD ON

## 2012-09-21 MED ORDER — SODIUM CHLORIDE 0.9 % IV SOLN
Freq: Once | INTRAVENOUS | Status: AC
  Administered 2012-09-21: 02:00:00 via INTRAVENOUS

## 2012-09-21 NOTE — ED Provider Notes (Signed)
CSN: [id number]     Arrival date & time 09/20/12  2338 History     First MD Initiated Contact with Patient 09/21/12 0007     Chief Complaint  Patient presents with  . Altered Mental Status   (Consider location/radiation/quality/duration/timing/severity/associated sxs/prior Treatment) HPI Comments: Per family patient has had change in mental acuity over the past 4-5 days   She also has had a number of changes to her Psychiatric mediations.  States her blood sugar has been steady, denies any recent illness, headache, N/V/D, dysuria, none prescribed use.    Had another fall last weekend falling to her side was seen by PCP and had chest xray but was not told results thinks she may have hit her head but is unsure.   Patient is a 67 y.o. female presenting with altered mental status. The history is provided by the patient and the spouse.  Altered Mental Status Presenting symptoms: confusion and memory loss   Severity:  Moderate Most recent episode:  More than 2 days ago Episode history:  Multiple Timing:  Intermittent Progression:  Worsening Chronicity:  New Context: head injury and recent change in medication   Context: not dementia and not drug use   Recent head injury:  Over 24 hours ago Associated symptoms: no abdominal pain, no agitation, no decreased appetite, no difficulty breathing, no eye deviation, no fever, no hallucinations, no headaches, no light-headedness, no nausea, no palpitations, no rash, no vomiting and no weakness     Past Medical History  Diagnosis Date  . Diabetes mellitus   . Depression   . Anxiety   . Retinopathy    Past Surgical History  Procedure Laterality Date  . Eye surgeries     . Cataract extraction, bilateral    . Colonoscopy     Family History  Problem Relation Age of Onset  . Cancer Mother   . COPD Father   . Diabetes Other   . Hypertension Other    History  Substance Use Topics  . Smoking status: Never Smoker   . Smokeless tobacco: Not  on file  . Alcohol Use: Yes     Comment: occassional    OB History   Grav Para Term Preterm Abortions TAB SAB Ect Mult Living                 Review of Systems  Constitutional: Positive for activity change. Negative for fever, chills, appetite change and decreased appetite.  HENT: Negative for congestion, neck pain and neck stiffness.   Eyes: Negative for photophobia and visual disturbance.  Respiratory: Negative for cough and shortness of breath.   Cardiovascular: Negative for chest pain and palpitations.  Gastrointestinal: Negative for nausea, vomiting, abdominal pain, diarrhea and constipation.  Genitourinary: Negative for dysuria.  Musculoskeletal: Negative for myalgias and back pain.  Skin: Negative for rash.  Neurological: Negative for dizziness, weakness, light-headedness, numbness and headaches.  Psychiatric/Behavioral: Positive for memory loss, confusion and altered mental status. Negative for hallucinations and agitation.    Allergies  Codeine; Sulfa antibiotics; and Adhesive  Home Medications   Current Outpatient Rx  Name  Route  Sig  Dispense  Refill  . ARIPiprazole (ABILIFY) 2 MG tablet   Oral   Take 2 mg by mouth every evening.          Marland Kitchen aspirin 81 MG tablet   Oral   Take 81 mg by mouth every morning.          . B Complex-C (B-COMPLEX WITH  VITAMIN C) tablet   Oral   Take 1 tablet by mouth every morning.          . benztropine (COGENTIN) 0.5 MG tablet   Oral   Take 0.5 mg by mouth at bedtime.         Marland Kitchen buPROPion (WELLBUTRIN) 75 MG tablet   Oral   Take 75 mg by mouth every morning.         Marland Kitchen ibuprofen (ADVIL,MOTRIN) 200 MG tablet   Oral   Take 400 mg by mouth every 6 (six) hours as needed for pain.         Marland Kitchen insulin glargine (LANTUS) 100 UNIT/ML injection   Subcutaneous   Inject 12 Units into the skin at bedtime.         . insulin lispro (HUMALOG) 100 UNIT/ML injection   Subcutaneous   Inject 4-8 Units into the skin 3 (three)  times daily before meals.         Marland Kitchen levothyroxine (SYNTHROID, LEVOTHROID) 100 MCG tablet   Oral   Take 100 mcg by mouth every morning.          Marland Kitchen LORazepam (ATIVAN) 1 MG tablet   Oral   Take 0.5 mg by mouth 2 (two) times daily.          . raloxifene (EVISTA) 60 MG tablet   Oral   Take 60 mg by mouth every morning.          . Vitamin D, Ergocalciferol, (DRISDOL) 50000 UNITS CAPS   Oral   Take 50,000 Units by mouth every 7 (seven) days. Every Tuesday         . Vortioxetine HBr (BRINTELLIX) 10 MG TABS   Oral   Take 1 tablet by mouth every morning.          BP 94/56  Pulse 96  Temp(Src) 98.5 F (36.9 C) (Oral)  Resp 18  Ht 5\' 6"  (1.676 m)  Wt 134 lb (60.782 kg)  BMI 21.64 kg/m2  SpO2 95% Physical Exam  Nursing note and vitals reviewed. Constitutional: She is oriented to person, place, and time. She appears well-developed and well-nourished.  HENT:  Head: Normocephalic.  Right Ear: External ear normal.  Left Ear: External ear normal.  Mouth/Throat: Oropharynx is clear and moist.  Eyes: Pupils are equal, round, and reactive to light.  Neck: Normal range of motion. Neck supple.  Cardiovascular: Normal rate and regular rhythm.   Pulmonary/Chest: Effort normal and breath sounds normal.  Abdominal: Soft. Bowel sounds are normal. She exhibits no distension. There is no tenderness.  Musculoskeletal: She exhibits no edema and no tenderness.  Lymphadenopathy:    She has no cervical adenopathy.  Neurological: She is alert and oriented to person, place, and time. She has normal strength. She displays a negative Romberg sign. GCS eye subscore is 4. GCS verbal subscore is 5. GCS motor subscore is 6. She displays no Babinski's sign on the right side. She displays no Babinski's sign on the left side.  Skin: Skin is warm and dry. No rash noted. No erythema. No pallor.  Psychiatric: She has a normal mood and affect. Her behavior is normal.    ED Course   Procedures  (including critical care time)  Labs Reviewed  CBC WITH DIFFERENTIAL - Abnormal; Notable for the following:    RBC 3.61 (*)    Hemoglobin 11.9 (*)    HCT 34.7 (*)    All other components within normal limits  URINALYSIS, ROUTINE W  REFLEX MICROSCOPIC - Abnormal; Notable for the following:    Glucose, UA >1000 (*)    All other components within normal limits  SALICYLATE LEVEL - Abnormal; Notable for the following:    Salicylate Lvl <2.0 (*)    All other components within normal limits  URINE RAPID DRUG SCREEN (HOSP PERFORMED)  ACETAMINOPHEN LEVEL  TRICYCLICS SCREEN, URINE  URINE MICROSCOPIC-ADD ON  POCT I-STAT, CHEM 8   Dg Ribs Unilateral W/chest Left  09/21/2012   *RADIOLOGY REPORT*  Clinical Data: Fall from chair.  Left anterior, lateral rib pain, below the breast.  LEFT RIBS AND CHEST - 3+ VIEW  Comparison:  None.  Findings: No definitive fracture.  No effusion or pneumothorax. Normal heart size.  Well-aerated lungs. Calcified peripheral left mid lung nodule, compatible with previous granulomatous infection. Aortic arch atherosclerosis.  IMPRESSION:  1. Negative for acute fracture.  2.  No evidence of acute cardiopulmonary disease.   Original Report Authenticated By: Tiburcio Pea   Ct Head Wo Contrast  09/21/2012   *RADIOLOGY REPORT*  Clinical Data: Confusion; difficulty with balance.  Possible memory loss.  Status post fall.  CT HEAD WITHOUT CONTRAST  Technique:  Contiguous axial images were obtained from the base of the skull through the vertex without contrast.  Comparison: CT of the head performed 08/02/2012  Findings: There is no evidence of acute infarction, mass lesion, or intra- or extra-axial hemorrhage on CT.  Prominence of the sulci suggests mild cortical volume loss.  Mild periventricular white matter change likely reflects small vessel ischemic microangiopathy.  The brainstem and fourth ventricle are within normal limits.  The basal ganglia are unremarkable in appearance.  The  cerebral hemispheres demonstrate grossly normal gray-white differentiation. No mass effect or midline shift is seen.  There is no evidence of fracture; visualized osseous structures are unremarkable in appearance.  The visualized portions of the orbits are within normal limits.  The paranasal sinuses and mastoid air cells are well-aerated.  No significant soft tissue abnormalities are seen.  IMPRESSION:  1.  No acute intracranial pathology seen on CT. 2.  Mild cortical volume loss and scattered small vessel ischemic microangiopathy.   Original Report Authenticated By: Tonia Ghent, M.D.   1. Frequent falls   2. Altered mental status     MDM   Patient's labs, CT scan, urine tox screens all have been reviewed all within normal limits.  This was discussed with the patient and her husband.  They will followup with her psychiatrist for continued adjustment of her medications and I have referred him to neurology for evaluation of her frequent falls.  Patient and her husband are grateful for this referral  Arman Filter, NP 09/21/12 0304  Arman Filter, NP 09/21/12 1478  Arman Filter, NP 09/21/12 [phone]

## 2012-09-21 NOTE — ED Provider Notes (Signed)
Medical screening examination/treatment/procedure(s) were performed by non-physician practitioner and as supervising physician I was immediately available for consultation/collaboration.  Arihanna Estabrook, MD 09/21/12 0843 

## 2012-09-26 ENCOUNTER — Encounter: Payer: Self-pay | Admitting: Neurology

## 2012-09-26 ENCOUNTER — Ambulatory Visit (INDEPENDENT_AMBULATORY_CARE_PROVIDER_SITE_OTHER): Payer: Medicare Other | Admitting: Neurology

## 2012-09-26 VITALS — BP 124/69 | HR 86 | Temp 97.1°F | Ht 65.5 in | Wt 136.0 lb

## 2012-09-26 DIAGNOSIS — R413 Other amnesia: Secondary | ICD-10-CM

## 2012-09-26 DIAGNOSIS — F329 Major depressive disorder, single episode, unspecified: Secondary | ICD-10-CM

## 2012-09-26 DIAGNOSIS — R2689 Other abnormalities of gait and mobility: Secondary | ICD-10-CM | POA: Insufficient documentation

## 2012-09-26 DIAGNOSIS — Z9181 History of falling: Secondary | ICD-10-CM

## 2012-09-26 DIAGNOSIS — R269 Unspecified abnormalities of gait and mobility: Secondary | ICD-10-CM

## 2012-09-26 DIAGNOSIS — R296 Repeated falls: Secondary | ICD-10-CM

## 2012-09-26 NOTE — Progress Notes (Signed)
Subjective:    Patient ID: Leslie Mccann is a 67 y.o. female.  HPI  Huston Foley, MD, PhD [facility] [address], [address] [address] [place], [place] [address]  Dear Dr. Dierdre Highman,  I saw Leslie Mccann, upon your kind request in my neurologic clinic today for initial consultation of her gait disorder and recurrent falls. The patient is accompanied by her husband today. As you know, Leslie Mccann is a 67 year old right-handed woman with an underlying medical history of depression, anxiety, diabetes and retinopathy, who was recently at the emergency room after a fall. She was noted to have altered mental status. She was seen on 09/21/2012 and had workup including a chest x-ray which was negative for any fracture or cardiopulmonary disease. She had a head CT without contrast which showed no acute intracranial pathology and mild cortical volume loss and scattered small vessel ischemic microangiopathy. She had a UDS which was negative. She has recently had some changes in her psychiatric medications and may have not taken some of her medication correctly.  She also had fallen on 08/02/2012 and went to the emergency room after hitting her head falling backwards. She had a head CT and cervical spine CT and I reviewed the reports:  Mild atrophy and chronic microvascular ischemic change. No acute intracranial findings. No skull fracture.  CT CERVICAL SPINE: There is severe disc space narrowing at C4-5, C5-C6, and moderate disc space narrowing at C6-7. There is facet mediated slip of 2-3 mm at C3-C4. There is no cervical spine fracture or traumatic subluxation. No intraspinal hematoma or mass. No prevertebral soft tissue swelling. Carotid calcification. No lung apex lesion.  Of note, she is on Abilify, baby aspirin, vitamin B complex, Cymbalta, ibuprofen, Lantus insulin, Humalog insulin, Synthroid, Ativan, Evista, vitamin D.  She has had memory loss for the past  She has had a hand tremor  of about a year. She was taken off of Prozac and was changed her to Brintellix about a month ago, but this was stopped 3 weeks ago. She was taken off Cogentin a few days ago and quit Wellbutrin a couple of days ago. She has been drinking a glas of wine each night, but denies heavy drinking in the past and quit smoking in 2000. She has had some high CBS values. A few days ago she has had a blood sugar of 600 per husband.    Her Past Medical History Is Significant For: Past Medical History  Diagnosis Date  . Diabetes mellitus   . Depression   . Anxiety   . Retinopathy     Her Past Surgical History Is Significant For: Past Surgical History  Procedure Laterality Date  . Eye surgeries     . Cataract extraction, bilateral    . Colonoscopy      Her Family History Is Significant For: Family History  Problem Relation Age of Onset  . Cancer Mother   . COPD Father   . Diabetes Other   . Hypertension Other     Her Social History Is Significant For: History   Social History  . Marital Status: Married    Spouse Name: N/A    Number of Children: N/A  . Years of Education: N/A   Social History Main Topics  . Smoking status: Never Smoker   . Smokeless tobacco: None  . Alcohol Use: Yes     Comment: occassional   . Drug Use: No  . Sexual Activity:    Other  Topics Concern  . None   Social History Narrative  . None    Her Allergies Are:  Allergies  Allergen Reactions  . Codeine Nausea And Vomiting  . Sulfa Antibiotics Nausea And Vomiting  . Adhesive [Tape] Rash  :   Her Current Medications Are:  Outpatient Encounter Prescriptions as of 09/26/2012  Medication Sig Dispense Refill  . ARIPiprazole (ABILIFY) 2 MG tablet Take 4 mg by mouth every evening.       Marland Kitchen aspirin 81 MG tablet Take 81 mg by mouth every morning.       . B Complex-C (B-COMPLEX WITH VITAMIN C) tablet Take 1 tablet by mouth every morning.       . DULoxetine (CYMBALTA) 30 MG capsule Take 1 capsule by mouth  daily.      Marland Kitchen ibuprofen (ADVIL,MOTRIN) 200 MG tablet Take 400 mg by mouth every 6 (six) hours as needed for pain.      Marland Kitchen insulin glargine (LANTUS) 100 UNIT/ML injection Inject 12 Units into the skin at bedtime.      . insulin lispro (HUMALOG) 100 UNIT/ML injection Inject 4-8 Units into the skin 3 (three) times daily before meals.      Marland Kitchen levothyroxine (SYNTHROID, LEVOTHROID) 100 MCG tablet Take 100 mcg by mouth every morning.       Marland Kitchen LORazepam (ATIVAN) 1 MG tablet Take 0.5 mg by mouth 2 (two) times daily.       . raloxifene (EVISTA) 60 MG tablet Take 60 mg by mouth every morning.       . Vitamin D, Ergocalciferol, (DRISDOL) 50000 UNITS CAPS Take 50,000 Units by mouth every 7 (seven) days. Every Tuesday      . benztropine (COGENTIN) 0.5 MG tablet Take 0.5 mg by mouth at bedtime.      Marland Kitchen buPROPion (WELLBUTRIN) 75 MG tablet Take 75 mg by mouth every morning.      . Vortioxetine HBr (BRINTELLIX) 10 MG TABS Take 1 tablet by mouth every morning.       No facility-administered encounter medications on file as of 09/26/2012.   Review of Systems  Constitutional: Positive for activity change, appetite change, fatigue and unexpected weight change.  HENT: Positive for trouble swallowing.   Respiratory: Positive for shortness of breath.   Cardiovascular: Positive for chest pain.  Gastrointestinal: Positive for constipation.  Musculoskeletal: Positive for arthralgias.  Neurological: Positive for dizziness and tremors.       Memory loss  Hematological: Bruises/bleeds easily.  Psychiatric/Behavioral: Positive for confusion and dysphoric mood. The patient is nervous/anxious.        Racing thoughts    Objective:  Neurologic Exam  Physical Exam Physical Examination:   Filed Vitals:   09/26/12 0855  BP: 124/69  Pulse: 86  Temp: 97.1 F (36.2 C)    General Examination: The patient is a very pleasant 67 y.o. female in no acute distress. She appears well-developed and well-nourished and well groomed.  She appears restless, akathisia-like.  HEENT: Normocephalic, atraumatic, pupils are equal, round and reactive to light and accommodation. Funduscopic exam is normal with sharp disc margins noted. Extraocular tracking is good without limitation to gaze excursion or nystagmus noted. Normal smooth pursuit is noted. Hearing is grossly intact. Tympanic membranes are clear bilaterally. Face is symmetric with normal facial animation and normal facial sensation. Speech is clear with no dysarthria noted. There is no hypophonia. There is no lip, neck/head, jaw or voice tremor. Neck is supple with full range of passive and active motion. There  are no carotid bruits on auscultation. Oropharynx exam reveals: moderate mouth dryness, adequate dental hygiene and no significant airway crowding. Mallampati is class II. Tongue protrudes centrally and palate elevates symmetrically.  Chest: Clear to auscultation without wheezing, rhonchi or crackles noted.  Heart: S1+S2+0, regular and normal without murmurs, rubs or gallops noted.   Abdomen: Soft, non-tender and non-distended with normal bowel sounds appreciated on auscultation.  Extremities: There is no pitting edema in the distal lower extremities bilaterally. Pedal pulses are intact.  Skin: Warm and dry without trophic changes noted. There are no varicose veins.  Musculoskeletal: exam reveals no obvious joint deformities, tenderness or joint swelling or erythema.   Neurologically:  Mental status: The patient is awake, alert and oriented in all 4 spheres. Her memory, attention, language and knowledge are mildly impaired, her MMSE is 26/30, her Clock drawing is 4/4, her AFT is 17. There is no aphasia, agnosia, apraxia or anomia. Speech is clear with normal prosody and enunciation. Thought process is linear. Affect is constricted. She displays mild  Cranial nerves are as described above under HEENT exam. In addition, shoulder shrug is normal with equal shoulder height  noted. Motor exam: Normal bulk, strength and tone is noted. There is no drift, tremor or rebound. Romberg is negative. Reflexes are 2+ throughout. Toes are downgoing bilaterally. Fine motor skills are intact with normal finger taps, normal hand movements, normal rapid alternating patting, normal foot taps and normal foot agility.  Cerebellar testing shows no dysmetria or intention tremor on finger to nose testing. Heel to shin is unremarkable bilaterally. There is no truncal or gait ataxia.  Sensory exam is intact to light touch, pinprick, vibration, temperature sense and proprioception in the upper and lower extremities.  Gait, station and balance are unremarkable. No veering to one side is noted. No leaning to one side is noted. Posture is age-appropriate and stance is narrow based. No problems turning are noted. She turns en in 3 steps. Tandem walk is unremarkable. Intact toe and heel stance is noted.               Assessment and Plan:   In summary, Leslie Mccann is a very pleasant 67 y.o.-year old female with a history of depression and anxiety, hypothyroidism, and diabetes, who has had a gait disorder, with recurrent falls. I think there are several things at play here: she has evidence of peripheral neuropathy, and confounding factors are multiple psychotropic medications which can impaired balance. While she does not have any parkinsonism one has to be mindful of a gait disorder and parkinsonian symptoms. Unfortunately, her mood disorder is suboptimally controlled but on the positive side she has had some improvements per husband since the most recent medication changes have been undertaken. She has mild memory loss mostly in the realm of mild cognitive impairment. Her CT scan showed mild atrophy. I do not see any focal neurologic deficit with the exception of evidence of neuropathy, most likely diabetic in nature. She has had some fluctuations in her blood glucose levels per husband. She states her  last hemoglobin A1c was around 8. She had some blood work with her primary care physician and we will request that. At this juncture I will not add any more blood test until I see test results from her primary care physician so we don't repeat anything. Down the road I may ask her to have formal memory testing done under a neuropsychologist. At this juncture I think it is more important that  she is stabilized on a medication regimen for her depression first. She and her husband were in agreement. She has been advised to start using a cane at all times and change positions slowly. She is also advised to drink more water and abstain from drinking alcohol altogether. She has microvascular changes on her head CT and has a history of smoking which probably contribute to microvascular disease. Thankfully she has quit smoking. I asked her to followup with me routinely in 4 months from now, sooner if the need arises. And we will pick up our discussion about additional blood work and formal neurocognitive testing at the time.   Thank you very much for allowing me to participate in the care of this nice patient. If I can be of any further assistance to you please do not hesitate to call me at [phone].  Sincerely,   Huston Foley, MD, PhD

## 2012-09-26 NOTE — Patient Instructions (Addendum)
Remember to drink plenty of fluid, eat healthy meals and do not skip any meals. Try to eat protein with a every meal and eat a healthy snack such as fruit or nuts in between meals. Try to keep a regular sleep-wake schedule and try to exercise daily, particularly in the form of walking, 20-30 minutes a day, if you can.   Engage in social activities in your community and with your family and try to keep up with current events by reading the newspaper or watching the news.   As far as your medications are concerned, I would like to suggest no changes.     We will request blood test results from Dr. Evlyn Kanner and I may add further blood work next time.   As far as diagnostic testing: we will consider formal memory testing down the road.   Please start using a cane.   I would like to see you back in 4 months, sooner if we need to. Please call us with any interim questions, concerns, problems, updates or refill requests.  Brett Canales is my clinical assistant and will answer any of your questions and relay your messages to me and also relay most of my messages to you.  Our phone number is [phone]. We also have an after hours call service for urgent matters and there is a physician on-call for urgent questions. For any emergencies you know to call 911 or go to the nearest emergency room.

## 2013-02-16 ENCOUNTER — Ambulatory Visit: Payer: Medicare Other | Admitting: Neurology

## 2013-02-24 ENCOUNTER — Ambulatory Visit: Payer: Medicare Other | Admitting: Neurology

## 2013-09-29 ENCOUNTER — Other Ambulatory Visit: Payer: Self-pay | Admitting: Endocrinology

## 2013-09-29 DIAGNOSIS — G459 Transient cerebral ischemic attack, unspecified: Secondary | ICD-10-CM

## 2013-09-30 ENCOUNTER — Other Ambulatory Visit (HOSPITAL_COMMUNITY): Payer: Self-pay | Admitting: Endocrinology

## 2013-09-30 ENCOUNTER — Ambulatory Visit (HOSPITAL_COMMUNITY)
Admission: RE | Admit: 2013-09-30 | Discharge: 2013-09-30 | Disposition: A | Discharge status: Home / Self Care | Payer: Medicare Other | Source: Ambulatory Visit | Attending: Vascular Surgery | Admitting: Vascular Surgery

## 2013-09-30 DIAGNOSIS — I6529 Occlusion and stenosis of unspecified carotid artery: Secondary | ICD-10-CM | POA: Diagnosis not present

## 2013-09-30 DIAGNOSIS — G459 Transient cerebral ischemic attack, unspecified: Secondary | ICD-10-CM

## 2013-10-05 ENCOUNTER — Ambulatory Visit
Admission: RE | Admit: 2013-10-05 | Discharge: 2013-10-05 | Disposition: A | Discharge status: Home / Self Care | Payer: Medicare Other | Source: Ambulatory Visit | Attending: Endocrinology | Admitting: Endocrinology

## 2013-10-05 DIAGNOSIS — G459 Transient cerebral ischemic attack, unspecified: Secondary | ICD-10-CM

## 2013-10-05 MED ORDER — GADOBENATE DIMEGLUMINE 529 MG/ML IV SOLN
12.0000 mL | Freq: Once | INTRAVENOUS | Status: AC | PRN
  Administered 2013-10-05: 12 mL via INTRAVENOUS

## 2013-10-16 ENCOUNTER — Encounter: Payer: Self-pay | Admitting: Neurology

## 2013-10-16 ENCOUNTER — Ambulatory Visit (INDEPENDENT_AMBULATORY_CARE_PROVIDER_SITE_OTHER): Payer: Medicare Other | Admitting: Neurology

## 2013-10-16 VITALS — BP 97/64 | HR 98 | Resp 16 | Ht 66.0 in | Wt 132.0 lb

## 2013-10-16 DIAGNOSIS — E08319 Diabetes mellitus due to underlying condition with unspecified diabetic retinopathy without macular edema: Secondary | ICD-10-CM

## 2013-10-16 DIAGNOSIS — E1339 Other specified diabetes mellitus with other diabetic ophthalmic complication: Secondary | ICD-10-CM

## 2013-10-16 DIAGNOSIS — E11311 Type 2 diabetes mellitus with unspecified diabetic retinopathy with macular edema: Secondary | ICD-10-CM

## 2013-10-16 DIAGNOSIS — R0683 Snoring: Secondary | ICD-10-CM

## 2013-10-16 DIAGNOSIS — R0609 Other forms of dyspnea: Secondary | ICD-10-CM

## 2013-10-16 DIAGNOSIS — G4751 Confusional arousals: Secondary | ICD-10-CM | POA: Insufficient documentation

## 2013-10-16 DIAGNOSIS — G459 Transient cerebral ischemic attack, unspecified: Secondary | ICD-10-CM

## 2013-10-16 DIAGNOSIS — R404 Transient alteration of awareness: Secondary | ICD-10-CM

## 2013-10-16 DIAGNOSIS — G454 Transient global amnesia: Secondary | ICD-10-CM

## 2013-10-16 DIAGNOSIS — E08311 Diabetes mellitus due to underlying condition with unspecified diabetic retinopathy with macular edema: Secondary | ICD-10-CM

## 2013-10-16 DIAGNOSIS — R0989 Other specified symptoms and signs involving the circulatory and respiratory systems: Secondary | ICD-10-CM

## 2013-10-16 DIAGNOSIS — E11319 Type 2 diabetes mellitus with unspecified diabetic retinopathy without macular edema: Secondary | ICD-10-CM

## 2013-10-16 HISTORY — DX: Snoring: R06.83

## 2013-10-16 HISTORY — DX: Confusional arousals: G47.51

## 2013-10-16 HISTORY — DX: Transient cerebral ischemic attack, unspecified: G45.9

## 2013-10-16 HISTORY — DX: Type 2 diabetes mellitus with unspecified diabetic retinopathy without macular edema: E11.319

## 2013-10-16 NOTE — Progress Notes (Deleted)
Subjective:    Patient ID: Leslie Mccann is a 68 y.o. female.  HPI  Star Age, MD, PhD [facility] [address], [address] [place], [place] [address]  Dear Dr. Marnette Burgess,  I saw Leslie Mccann, upon your kind request in my neurologic clinic today for initial consultation of her gait disorder and recurrent falls. The patient is accompanied by her husband today. As you know, Leslie Mccann is a 68 year old right-handed woman with an underlying medical history of depression, anxiety, diabetes and retinopathy, who was recently at the emergency room after a fall. She was noted to have altered mental status. She was seen on 09/21/2012 and had workup including a chest x-ray which was negative for any fracture or cardiopulmonary disease. She had a head CT without contrast which showed no acute intracranial pathology and mild cortical volume loss and scattered small vessel ischemic microangiopathy. She had a UDS which was negative. She has recently had some changes in her psychiatric medications and may have not taken some of her medication correctly.  She also had fallen on 08/02/2012 and went to the emergency room after hitting her head falling backwards. She had a head CT and cervical spine CT and I reviewed the reports:  Mild atrophy and chronic microvascular ischemic change. No acute intracranial findings. No skull fracture.  CT CERVICAL SPINE: There is severe disc space narrowing at C4-5, C5-C6, and moderate disc space narrowing at C6-7. There is facet mediated slip of 2-3 mm at C3-C4. There is no cervical spine fracture or traumatic subluxation. No intraspinal hematoma or mass. No prevertebral soft tissue swelling. Carotid calcification. No lung apex lesion.  Of note, she is on Abilify, baby aspirin, vitamin B complex, Cymbalta, ibuprofen, Lantus insulin, Humalog insulin, Synthroid, Ativan, Evista, vitamin D.  She has had memory loss for the past  She has had a hand tremor  of about a year. She was taken off of Prozac and was changed her to Brintellix about a month ago, but this was stopped 3 weeks ago. She was taken off Cogentin a few days ago and quit Wellbutrin a couple of days ago. She has been drinking a glas of wine each night, but denies heavy drinking in the past and quit smoking in 2000. She has had some high CBS values. A few days ago she has had a blood sugar of 600 per husband.    Her Past Medical History Is Significant For: Past Medical History  Diagnosis Date  . Diabetes mellitus   . Depression   . Anxiety   . Retinopathy     Her Past Surgical History Is Significant For: Past Surgical History  Procedure Laterality Date  . Eye surgeries     . Cataract extraction, bilateral    . Colonoscopy      Her Family History Is Significant For: Family History  Problem Relation Age of Onset  . Cancer Mother   . COPD Father   . Diabetes Other   . Hypertension Other   . Stroke Maternal Grandmother   . Stroke Maternal Grandfather   . Cancer Maternal Grandfather     Her Social History Is Significant For: History   Social History  . Marital Status: Married    Spouse Name: Leslie Mccann    Number of Children: 1  . Years of Education: [employer]   Social History Main Topics  . Smoking status: Former Research scientist (life sciences)  . Smokeless tobacco: Never Used  . Alcohol Use: Yes  Comment: occassional   . Drug Use: No  . Sexual Activity: None   Other Topics Concern  . None   Social History Narrative   Patient is married Leslie Mccann) and lives at home with her husband.   Patient has one adult child.   Patient has a college education.   Patient is right-handed.   Patient drinks 1-2 cups of coffee daily.    Her Allergies Are:  Allergies  Allergen Reactions  . Codeine Nausea And Vomiting  . Sulfa Antibiotics Nausea And Vomiting  . Adhesive [Tape] Rash  :   Her Current Medications Are:  Outpatient Encounter Prescriptions as of 10/16/2013  Medication  Sig  . ARIPiprazole (ABILIFY) 2 MG tablet Take by mouth every evening. 1 1/2 tABLET  . aspirin 81 MG tablet Take 81 mg by mouth every morning.   . B Complex-C (B-COMPLEX WITH VITAMIN C) tablet Take 1 tablet by mouth every morning.   . DULoxetine (CYMBALTA) 30 MG capsule Take 1 capsule by mouth daily.  . insulin glargine (LANTUS) 100 UNIT/ML injection Inject 12 Units into the skin at bedtime.  . insulin lispro (HUMALOG) 100 UNIT/ML injection Inject 4-8 Units into the skin 3 (three) times daily before meals.  Marland Kitchen levothyroxine (SYNTHROID, LEVOTHROID) 100 MCG tablet Take 100 mcg by mouth every morning.   Marland Kitchen LORazepam (ATIVAN) 1 MG tablet Take 1 mg by mouth 2 (two) times daily.   . raloxifene (EVISTA) 60 MG tablet Take 60 mg by mouth every evening.   . traMADol (ULTRAM) 50 MG tablet As needed  . Vortioxetine HBr (BRINTELLIX) 10 MG TABS Take 1 tablet by mouth every morning.  . [DISCONTINUED] benztropine (COGENTIN) 0.5 MG tablet Take 0.5 mg by mouth at bedtime.  . [DISCONTINUED] buPROPion (WELLBUTRIN) 75 MG tablet Take 75 mg by mouth every morning.  . [DISCONTINUED] ibuprofen (ADVIL,MOTRIN) 200 MG tablet Take 400 mg by mouth every 6 (six) hours as needed for pain.  . [DISCONTINUED] Vitamin D, Ergocalciferol, (DRISDOL) 50000 UNITS CAPS Take 50,000 Units by mouth every 7 (seven) days. Every Tuesday   Review of Systems  Constitutional: Positive for activity change, appetite change, fatigue and unexpected weight change.  HENT: Positive for trouble swallowing.   Respiratory: Positive for shortness of breath.   Cardiovascular: Positive for chest pain.  Gastrointestinal: Positive for constipation.  Musculoskeletal: Positive for arthralgias.  Neurological: Positive for dizziness and tremors.       Memory loss  Hematological: Bruises/bleeds easily.  Psychiatric/Behavioral: Positive for confusion and dysphoric mood. The patient is nervous/anxious.        Racing thoughts    Objective:  Neurologic  Exam  Physical Exam Physical Examination:   Filed Vitals:   10/16/13 1440  BP: 97/64  Pulse: 98  Resp: 16    General Examination: The patient is a very pleasant 68 y.o. female in no acute distress. She appears well-developed and well-nourished and well groomed. She appears restless, akathisia-like.  HEENT: Normocephalic, atraumatic, pupils are equal, round and reactive to light and accommodation. Funduscopic exam is normal with sharp disc margins noted. Extraocular tracking is good without limitation to gaze excursion or nystagmus noted. Normal smooth pursuit is noted. Hearing is grossly intact. Tympanic membranes are clear bilaterally. Face is symmetric with normal facial animation and normal facial sensation. Speech is clear with no dysarthria noted. There is no hypophonia. There is no lip, neck/head, jaw or voice tremor. Neck is supple with full range of passive and active motion. There are no carotid bruits  on auscultation. Oropharynx exam reveals: moderate mouth dryness, adequate dental hygiene and no significant airway crowding. Mallampati is class II. Tongue protrudes centrally and palate elevates symmetrically.  Chest: Clear to auscultation without wheezing, rhonchi or crackles noted.  Heart: S1+S2+0, regular and normal without murmurs, rubs or gallops noted.   Abdomen: Soft, non-tender and non-distended with normal bowel sounds appreciated on auscultation.  Extremities: There is no pitting edema in the distal lower extremities bilaterally. Pedal pulses are intact.  Skin: Warm and dry without trophic changes noted. There are no varicose veins.  Musculoskeletal: exam reveals no obvious joint deformities, tenderness or joint swelling or erythema.   Neurologically:  Mental status: The patient is awake, alert and oriented in all 4 spheres. Her memory, attention, language and knowledge are mildly impaired, her MMSE is 26/30, her Clock drawing is 4/4, her AFT is 17. There is no  aphasia, agnosia, apraxia or anomia. Speech is clear with normal prosody and enunciation. Thought process is linear. Affect is constricted. She displays mild  Cranial nerves are as described above under HEENT exam. In addition, shoulder shrug is normal with equal shoulder height noted. Motor exam: Normal bulk, strength and tone is noted. There is no drift, tremor or rebound. Romberg is negative. Reflexes are 2+ throughout. Toes are downgoing bilaterally. Fine motor skills are intact with normal finger taps, normal hand movements, normal rapid alternating patting, normal foot taps and normal foot agility.  Cerebellar testing shows no dysmetria or intention tremor on finger to nose testing. Heel to shin is unremarkable bilaterally. There is no truncal or gait ataxia.  Sensory exam is intact to light touch, pinprick, vibration, temperature sense and proprioception in the upper and lower extremities.  Gait, station and balance are unremarkable. No veering to one side is noted. No leaning to one side is noted. Posture is age-appropriate and stance is narrow based. No problems turning are noted. She turns en in 3 steps. Tandem walk is unremarkable. Intact toe and heel stance is noted.               Assessment and Plan:   In summary, Leslie Mccann is a very pleasant 68 y.o.-year old female with a history of depression and anxiety, hypothyroidism, and diabetes, who has had a gait disorder, with recurrent falls. I think there are several things at play here: she has evidence of peripheral neuropathy, and confounding factors are multiple psychotropic medications which can impaired balance. While she does not have any parkinsonism one has to be mindful of a gait disorder and parkinsonian symptoms. Unfortunately, her mood disorder is suboptimally controlled but on the positive side she has had some improvements per husband since the most recent medication changes have been undertaken. She has mild memory loss mostly in  the realm of mild cognitive impairment. Her CT scan showed mild atrophy. I do not see any focal neurologic deficit with the exception of evidence of neuropathy, most likely diabetic in nature. She has had some fluctuations in her blood glucose levels per husband. She states her last hemoglobin A1c was around 8. She had some blood work with her primary care physician and we will request that. At this juncture I will not add any more blood test until I see test results from her primary care physician so we don't repeat anything. Down the road I may ask her to have formal memory testing done under a neuropsychologist. At this juncture I think it is more important that she is stabilized on  a medication regimen for her depression first. She and her husband were in agreement. She has been advised to start using a cane at all times and change positions slowly. She is also advised to drink more water and abstain from drinking alcohol altogether. She has microvascular changes on her head CT and has a history of smoking which probably contribute to microvascular disease. Thankfully she has quit smoking. I asked her to followup with me routinely in 4 months from now, sooner if the need arises. And we will pick up our discussion about additional blood work and formal neurocognitive testing at the time.   Thank you very much for allowing me to participate in the care of this nice patient. If I can be of any further assistance to you please do not hesitate to call me at [phone].  Sincerely,   Star Age, MD, PhD

## 2013-10-16 NOTE — Patient Instructions (Signed)
Polysomnography (Sleep Studies) Polysomnography (PSG) is a series of tests used for detecting (diagnosing) obstructive sleep apnea and other sleep disorders. The tests measure how some parts of your body are working while you are sleeping. The tests are extensive and expensive. They are done in a sleep lab or hospital, and vary from center to center. Your caregiver may perform other more simple sleep studies and questionnaires before doing more complete and involved testing. Testing may not be covered by insurance. Some of these tests are:  An EEG (Electroencephalogram). This tests your brain waves and stages of sleep.  An EOG (Electrooculogram). This measures the movements of your eyes. It detects periods of REM (rapid eye movement) sleep, which is your dream sleep.  An EKG (Electrocardiogram). This measures your heart rhythm.  EMG (Electromyography). This is a measurement of how the muscles are working in your upper airway and your legs while sleeping.  An oximetry measurement. It measures how much oxygen (air) you are getting while sleeping.  Breathing efforts may be measured. The same test can be interpreted (understood) differently by different caregivers and centers that study sleep.  Studies may be given an apnea/hypopnea index (AHI). This is a number which is found by counting the times of no breathing or under breathing during the night, and relating those numbers to the amount of time spent in bed. When the AHI is greater than 15, the patient is likely to complain of daytime sleepiness. When the AHI is greater than 30, the patient is at increased risk for heart problems and must be followed more closely. Following the AHI also allows you to know how treatment is working. Simple oximetry (tracking the amount of oxygen that is taken in) can be used for screening patients who:  Do not have symptoms (problems) of OSA.  Have a normal Epworth Sleepiness Scale Score.  Have a low pre-test  probability of having OSA.  Have none of the upper airway problems likely to cause apnea.  Oximetry is also used to determine if treatment is effective in patients who showed significant desaturations (not getting enough oxygen) on their home sleep study. One extra measure of safety is to perform additional studies for the person who only snores. This is because no one can predict with absolute certainty who will have OSA. Those who show significant desaturations (not getting enough oxygen) are recommended to have a more detailed sleep study. Document Released: 08/05/2002 Document Revised: 04/23/2011 Document Reviewed: 04/06/2013 ExitCare Patient Information 2015 [employer]. This information is not intended to replace advice given to you by your health care provider. Make sure you discuss any questions you have with your health care provider.  

## 2013-10-16 NOTE — Progress Notes (Signed)
SLEEP MEDICINE CLINIC   Provider:  Larey Seat, M D  Referring Provider: Sheela Stack, MD Primary Care Physician:  Sheela Stack, MD  Chief Complaint  Patient presents with  . New Evaluation    Room 11  . Sleep consult    HPI:  Leslie Mccann is a 68 y.o. female  , who is  seen here as a referral from her endocrinologist Leslie Mccann for a sleep evaluation after a TIA/ CVA episode.    Leslie Mccann reports that she suffered a stroke like event about 10 days ago. The patient reports that she was napping on her couch while the golf channel was running on TV in the background. She will cut and wanted to take her blood sugar level and could not remember how to utilize the test strip and device. This scared her because she has been a diabetic for decades and has never had this kind of apraxia before. She is taking 2 kinds of injectable insulin and she has retinopathy , vasculopathy,  neuropathy and chronic disease anemia related to her diabetes type 1 with onset in the year 36. She takes pride in taking care of herself.  The patient reports falling sometimes asleep at any time of day, and when waking up feels disorientated and confused. She suffered several hypoglycemic episodes, at times needed EMS services. Her husband is usually able to detect "when something goes wrong ".  The patient has a past medical sleep history of chronic insomnia, and was considered a night owl. 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 She has better sleep habits , but still need a long time  to initiate sleep and watches TV. She has not tried melatonin.   Her sleep habits are as follows: goes to bed at 9.30 Pm but watched Tennis for an hour before going to sleep. She wakes up frequently for nocturia , 4 times nightly, dreading having to go to bed again.  No diaphoresis, or palpitation. Low blood sugar seems to promote bizarrre, vivid dreams- sometimes she feels threatend by the  content. 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 Total overnight sleep time is 7-8 hours. According to her husband she snores on her back and sometimes stops to breath, he is concerned about shallow breathing. This may have provoked the TIA 10 days ago?   No caffeine intake , 2-3 glasses of wine per week. Non smoker.  Quit 15 years ago.     Review of Systems: Out of a complete 14 system review, the patient complains of only the following symptoms, and all other reviewed systems are negative.   Epworth score 4 , Fatigue severity score 43  , depression score 6 Retinopathic vision, cataract, no sleep choking. No sleep walking, but talking, confusion with hypoglycemia .    History   Social History  . Marital Status: Married    Spouse Name: Leslie Mccann    Number of Children: 1  . Years of Education: College   Occupational History  . Not on file.   Social History Main Topics  . Smoking status: Former Research scientist (life sciences)  . Smokeless tobacco: Never Used  . Alcohol Use: Yes     Comment: occassional   . Drug Use: No  . Sexual Activity: Not on file   Other Topics Concern  . Not on file   Social History Narrative   Patient  is married Leslie Mccann) and lives at home with her husband.   Patient has one adult child.   Patient has a college education.   Patient is right-handed.   Patient drinks 1-2 cups of coffee daily.    Family History  Problem Relation Age of Onset  . Cancer Mother   . COPD Father   . Diabetes Other   . Hypertension Other   . Stroke Maternal Grandmother   . Stroke Maternal Grandfather   . Cancer Maternal Grandfather     Past Medical History  Diagnosis Date  . Diabetes mellitus   . Depression   . Anxiety   . Retinopathy     Past Surgical History  Procedure Laterality Date  . Eye surgeries     . Cataract extraction, bilateral    . Colonoscopy      Current Outpatient  Prescriptions  Medication Sig Dispense Refill  . ARIPiprazole (ABILIFY) 2 MG tablet Take by mouth every evening. 1 1/2 tABLET      . aspirin 81 MG tablet Take 81 mg by mouth every morning.       . B Complex-C (B-COMPLEX WITH VITAMIN C) tablet Take 1 tablet by mouth every morning.       . DULoxetine (CYMBALTA) 30 MG capsule Take 1 capsule by mouth daily.      . insulin glargine (LANTUS) 100 UNIT/ML injection Inject 12 Units into the skin at bedtime.      . insulin lispro (HUMALOG) 100 UNIT/ML injection Inject 4-8 Units into the skin 3 (three) times daily before meals.      Marland Kitchen levothyroxine (SYNTHROID, LEVOTHROID) 100 MCG tablet Take 100 mcg by mouth every morning.       Marland Kitchen LORazepam (ATIVAN) 1 MG tablet Take 1 mg by mouth 2 (two) times daily.       . raloxifene (EVISTA) 60 MG tablet Take 60 mg by mouth every evening.       . traMADol (ULTRAM) 50 MG tablet As needed      . Vortioxetine HBr (BRINTELLIX) 10 MG TABS Take 1 tablet by mouth every morning.       No current facility-administered medications for this visit.    Allergies as of 10/16/2013 - Review Complete 10/16/2013  Allergen Reaction Noted  . Codeine Nausea And Vomiting 05/09/2011  . Sulfa antibiotics Nausea And Vomiting 05/09/2011  . Adhesive [tape] Rash 05/09/2011    Vitals: BP 97/64  Pulse 98  Resp 16  Ht 5\' 6"  (1.676 m)  Wt 132 lb (59.875 kg)  BMI 21.32 kg/m2 Last Weight:  Wt Readings from Last 1 Encounters:  10/16/13 132 lb (59.875 kg)       Last Height:   Ht Readings from Last 1 Encounters:  10/16/13 5\' 6"  (1.676 m)    Physical exam:  General: The patient is awake, alert and appears not in acute distress. The patient is well groomed. Head: Normocephalic, atraumatic. Neck is supple. Mallampati 2,  neck circumference:13.75. Nasal airflow unrestricted , TMJ is evident. Retrognathia is not seen.  Cardiovascular:  Regular rate and rhythm , without  murmurs or carotid bruit, and without distended neck  veins. Respiratory: Lungs are clear to auscultation. Skin:  Without evidence of edema, or rash, patient presents . Trunk:normal posture.  Neurologic exam : The patient is awake and alert, oriented to place and time.   Memory subjective described as intact. There is a normal attention span & concentration ability.  Speech is fluent without dysarthria, dysphonia or aphasia.  She speaks measured and slow.  Mood and affect are appropriate.  Cranial nerves: Pupils are equal and poorly reactive to light.  Funduscopic exam with cotton  wool changes, laser scar , with  evidence of pallor / edema.  Extraocular movements  in vertical and horizontal planes intact and without nystagmus.  Tic under the right eyelid," flutter " Hearing to finger rub intact.   Facial sensation intact to fine touch.  Facial motor strength is symmetric and tongue and uvula move midline.  Motor exam:  Normal tone ,muscle bulk and symmetric ,strength in all extremities. No rigor or cog wheeling.  Sensory:  Fine touch, pinprick and vibration were tested in all extremities. Proprioception is tested in the upper extremities - normal.  Coordination: Rapid alternating movements in the fingers/hands is normal, she has no resting tremor.  Finger-to-nose maneuver normal without evidence of ataxia, dysmetria but  tremor.  Gait and station: Patient walks without assistive device . She braces herself to rise form th seated position. Strength within normal limits. Stance: she has truncal tremor, coarse tremor.   Tandem gait was impaired, romberg is positive for swaying  Deep tendon reflexes: in the  upper and lower extremities are symmetric and intact! . Babinski maneuver response is  downgoing.   Assessment:  After physical and neurologic examination, review of laboratory studies, imaging, neurophysiology testing and pre-existing records, assessment is   Patient with brittle diabetes, during our exam time I witnessed a facial tic  and foot trembling, her  Wide based stance and gait ataxia due to sensory neuropathy- Diabetic induced complications. \ 1) sleep is impaired by nocturia and hypoglycemia, with some confusional arousals attributed to both. 2) fatigue from diabetes alone is possible.   4) TIA was a spell of apraxia - MRI was negative per patient's report. Cervical doppler negative.    The patient was advised of the nature of the diagnosed sleep disorder , the treatment options and risks for general a health and wellness arising from not treating the condition. Visit duration was 45 minutes.   Plan:  Treatment plan and additional workup : SPLIT night study,  Patient will need to be looked after for hypo-glycemia, if diaphoretic or confused, please give juice . She will measure her own glucose.  Please place on a parasomnia montage.  Wants to bring her husband as attended  EEG, if possible double tine 45-60 minutes duration.  CC dr Forde Dandy, RV with me after tests.    Asencion Partridge Jet Armbrust MD  10/16/2013

## 2013-11-11 ENCOUNTER — Ambulatory Visit (INDEPENDENT_AMBULATORY_CARE_PROVIDER_SITE_OTHER): Payer: Medicare Other

## 2013-11-11 ENCOUNTER — Encounter (INDEPENDENT_AMBULATORY_CARE_PROVIDER_SITE_OTHER): Payer: Self-pay

## 2013-11-11 DIAGNOSIS — G454 Transient global amnesia: Secondary | ICD-10-CM

## 2013-11-11 DIAGNOSIS — E08311 Diabetes mellitus due to underlying condition with unspecified diabetic retinopathy with macular edema: Secondary | ICD-10-CM

## 2013-11-11 DIAGNOSIS — R404 Transient alteration of awareness: Secondary | ICD-10-CM

## 2013-11-13 NOTE — Procedures (Signed)
[facility]  EEG (ELECTROENCEPHALOGRAM) REPORT   STUDY DATE: 9 -- 30-15 PATIENT NAME: Leslie Mccann  MRN: [id number]  Recording time was 48 minutes   ORDERING CLINICIAN: Larey Seat, MD   TECHNOLOGIST: Laretta Alstrom  TECHNIQUE: Electroencephalogram was recorded utilizing standard 10-20 system of lead placement and reformatted into average and bipolar montages.      ACTIVATION:  hyperventilation    CLINICAL INFORMATION: This patient presents with a long-standing history of diabetic disease, followed by Dr. Ulyses Jarred. She has markedly edema retinopathy vasculopathy and chronic disease anemia. The patient's report that he falls sometimes asleep in the middle of the day and then come stool feeling disoriented and confused. Is a question of she could suffer from Vicryl sleep attacks or if these are diabetic hypoglycemic spells or seizures.   TIA. She had word finding difficulties and was disoriented. Marland Kitchen 10-31-13 the patient had a possible TIA.     FINDINGS:  The EEG  Background rhythm of 8 Hz  was seen in the posterior hemisphere is symmetric amplitude and frequency.. The   EKG heart rates varied from  78-81 beats per minute in NSR.   Photic stimulation  was not possible , due to technical difficulties with the machinery.  epileptiform discharges, periodic changes were not noted after hyperventilation. Would was not episodes the patient had become drowsy and appears asleep for much of the recording. No epileptiform activity is noted in all sleep stages either , which are presented as symmetric sleep spindles.  Conclusion ;normal EEG. Patient recorded in the awake/ drowsy/ sleep state.   IMPRESSION:  EEG is normal.   Blong Busk, MD        Larey Seat , MD

## 2013-11-16 ENCOUNTER — Telehealth: Payer: Self-pay | Admitting: *Deleted

## 2013-11-16 NOTE — Telephone Encounter (Signed)
Normal EEG for patient with possible micro-sleep attacks in daytime. CD. Patient aware of results.

## 2013-11-18 ENCOUNTER — Telehealth: Payer: Self-pay | Admitting: *Deleted

## 2013-11-18 NOTE — Telephone Encounter (Signed)
Patient returning Leslie Mccann's call, informed patient of normal EEG, patient verbalized understanding.

## 2013-11-25 ENCOUNTER — Ambulatory Visit (INDEPENDENT_AMBULATORY_CARE_PROVIDER_SITE_OTHER): Payer: Medicare Other

## 2013-11-25 DIAGNOSIS — G4751 Confusional arousals: Secondary | ICD-10-CM

## 2013-11-25 DIAGNOSIS — R404 Transient alteration of awareness: Secondary | ICD-10-CM

## 2013-11-25 DIAGNOSIS — E08311 Diabetes mellitus due to underlying condition with unspecified diabetic retinopathy with macular edema: Secondary | ICD-10-CM

## 2013-11-25 DIAGNOSIS — G4761 Periodic limb movement disorder: Secondary | ICD-10-CM

## 2013-11-25 DIAGNOSIS — R0683 Snoring: Secondary | ICD-10-CM

## 2013-11-25 DIAGNOSIS — G4734 Idiopathic sleep related nonobstructive alveolar hypoventilation: Secondary | ICD-10-CM

## 2013-12-08 ENCOUNTER — Telehealth: Payer: Self-pay | Admitting: *Deleted

## 2013-12-08 ENCOUNTER — Encounter: Payer: Self-pay | Admitting: *Deleted

## 2013-12-08 NOTE — Telephone Encounter (Signed)
Called and spoke with patient regarding her sleep study results.  Patient was informed that there was no significant sleep apnea found but due to the degree of hypoxemia Dr. Brett Fairy wanted her to consult with Dr. Baltazar Apo for pulmonology follow up.  Patient was mailed a copy of the results with a letter providing Dr. Collene Gobble contact information.  Dr. Reynold Bowen was also faxed a copy of the report.

## 2013-12-10 NOTE — Progress Notes (Signed)
Quick Note:  Results of EEG normal given to pt on 11-18-13 (as per note). ______

## 2013-12-11 ENCOUNTER — Telehealth: Payer: Self-pay | Admitting: Emergency Medicine

## 2013-12-11 NOTE — Telephone Encounter (Signed)
LMTCB for Kim

## 2013-12-11 NOTE — Telephone Encounter (Signed)
Spoke with Maudie Mercury  She states that Dr. Tera Helper wants RB to see the pt sooner for eval of hypoxia  Pt currently scheduled with RB for 01/12/14  I offered appt with another provider sooner and she states that it specifically needs to be with RB  Please advise if you are okay with overbooking your schedule to see this pt  Thanks!

## 2013-12-11 NOTE — Telephone Encounter (Signed)
Called made pt aware RB has no sooner appts. Nothing further needed

## 2013-12-14 NOTE — Telephone Encounter (Signed)
Yes this is OK with me 

## 2013-12-15 NOTE — Telephone Encounter (Signed)
Kim calling again a/b getting this pt in for an eariler appt, she can be reached @ [phone].Hillery Hunter

## 2013-12-15 NOTE — Telephone Encounter (Signed)
Called kim and appt scheduled to see RB 11/5 at 4:15. Aware pt needs to arrive at 4. Nothing further needed

## 2013-12-15 NOTE — Telephone Encounter (Signed)
Per Dr. Lamonte Sakai, ok to doublebook.Marland KitchenMarland KitchenMarland Kitchen

## 2013-12-17 ENCOUNTER — Ambulatory Visit (INDEPENDENT_AMBULATORY_CARE_PROVIDER_SITE_OTHER): Payer: Medicare Other | Admitting: Emergency Medicine

## 2013-12-17 ENCOUNTER — Encounter: Payer: Self-pay | Admitting: Emergency Medicine

## 2013-12-17 ENCOUNTER — Encounter (INDEPENDENT_AMBULATORY_CARE_PROVIDER_SITE_OTHER): Payer: Self-pay

## 2013-12-17 ENCOUNTER — Ambulatory Visit (INDEPENDENT_AMBULATORY_CARE_PROVIDER_SITE_OTHER): Payer: Medicare Other | Admitting: *Deleted

## 2013-12-17 VITALS — BP 126/56 | HR 83 | Temp 98.6°F | Ht 66.0 in | Wt 134.4 lb

## 2013-12-17 DIAGNOSIS — R0902 Hypoxemia: Secondary | ICD-10-CM

## 2013-12-17 DIAGNOSIS — G4734 Idiopathic sleep related nonobstructive alveolar hypoventilation: Secondary | ICD-10-CM

## 2013-12-17 DIAGNOSIS — Z23 Encounter for immunization: Secondary | ICD-10-CM

## 2013-12-17 NOTE — Assessment & Plan Note (Signed)
Etiology unclear although we certainly need to screen for an rule out airways and parenchymal lung disease. Note that she is on several medications that might suppress her respiratory drive including Abilify, Xanax, Ativan, Cymbalta. She does not desaturate with exertion on a walking oximetry today.  - I will start oxygen at 2 L/m while sleeping - We will perform an ONO after approximately 1 week to ensure that 2 L/m is adequate - Full PFT - chest x-ray at her next visit - I have asked her to review her medications with her psychiatrist to see if any of these can be adjusted to help with her respiratory drive - Follow up in 1 month

## 2013-12-17 NOTE — Progress Notes (Signed)
Subjective:    Patient ID: Leslie Mccann, female    DOB: 11-01-1945, 68 y.o.   MRN: [id number]  HPI 68 yo woman, former smoker (10 pk-yrs), childhood asthma, diabetes. She was referred to Dr Brett Fairy after an episode of confusion and apraxia concerning for a TIA. She recovered to baseline after this transient episode. Her principle complaint is severe fatigue.  She underwent PSG on 10/14 that showed some evidence for periodic limb movements, no apneas, but extended period of hypoxemia with SpO2 mid-80's%. She is on several meds that can affect ventilatory drive (and fatigue) >  Abilify, xanax, ativan, cymbalta.    Review of Systems  Constitutional: Negative for fever and unexpected weight change.  HENT: Negative for congestion, dental problem, ear pain, nosebleeds, postnasal drip, rhinorrhea, sinus pressure, sneezing, sore throat and trouble swallowing.   Eyes: Negative for redness and itching.  Respiratory: Negative for cough, chest tightness, shortness of breath and wheezing.   Cardiovascular: Negative for palpitations and leg swelling.  Gastrointestinal: Negative for nausea and vomiting.  Genitourinary: Negative for dysuria.  Musculoskeletal: Negative for joint swelling.  Skin: Negative for rash.  Neurological: Negative for headaches.  Hematological: Does not bruise/bleed easily.  Psychiatric/Behavioral: Negative for dysphoric mood. The patient is not nervous/anxious.    Past Medical History  Diagnosis Date  . Diabetes mellitus   . Depression   . Anxiety   . Retinopathy   . TIA (transient ischemic attack) 10/16/2013  . Diabetic retinopathy 10/16/2013  . Snorings 10/16/2013  . 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 Asthma     childhood  . Tremors of nervous system      Family History  Problem Relation Age of Onset  . Cancer Mother   . COPD Father   . Diabetes Father   . Hypertension Maternal Grandmother   . Stroke Maternal Grandmother   . Stroke Maternal Grandfather   . Cancer  Maternal Grandfather   . Hypothyroidism Daughter      History   Social History  . Marital Status: Married    Spouse Name: [name]    Number of Children: 1  . Years of Education: College   Occupational History  . Not on file.   Social History Main Topics  . Smoking status: Former Smoker -- 1.00 packs/day for 10 years    Types: Cigarettes    Quit date: 02/12/1998  . Smokeless tobacco: Never Used  . Alcohol Use: 0.0 oz/week    0 Not specified per week     Comment: occassional   . Drug Use: No  . Sexual Activity: Not on file   Other Topics Concern  . Not on file   Social History Narrative   Patient is married [name]) and lives at home with her husband.   Patient has one adult child.   Patient has a college education.   Patient is right-handed.   Patient drinks 1-2 cups of coffee daily.     Allergies  Allergen Reactions  . Codeine Nausea And Vomiting  . Sulfa Antibiotics Nausea And Vomiting  . Adhesive [Tape] Rash     Outpatient Prescriptions Prior to Visit  Medication Sig Dispense Refill  . ARIPiprazole (ABILIFY) 2 MG tablet Take by mouth every evening. 1 1/2 tABLET    . aspirin 81 MG tablet Take 81 mg by mouth every morning.     . insulin glargine (LANTUS) 100 UNIT/ML injection Inject 12 Units into the skin at bedtime.    . insulin lispro (HUMALOG)  100 UNIT/ML injection Inject 4-8 Units into the skin 3 (three) times daily before meals.    Marland Kitchen levothyroxine (SYNTHROID, LEVOTHROID) 100 MCG tablet Take 100 mcg by mouth every morning.     Marland Kitchen LORazepam (ATIVAN) 1 MG tablet Take 1 mg by mouth 2 (two) times daily.     . raloxifene (EVISTA) 60 MG tablet Take 60 mg by mouth every evening.     . traMADol (ULTRAM) 50 MG tablet As needed    . Vortioxetine HBr (BRINTELLIX) 10 MG TABS Take 1 tablet by mouth every morning.    . B Complex-C (B-COMPLEX WITH VITAMIN C) tablet Take 1 tablet by mouth every morning.     . DULoxetine (CYMBALTA) 30 MG capsule Take 1 capsule  by mouth daily.     No facility-administered medications prior to visit.         Objective:   Physical Exam Filed Vitals:   12/17/13 1624  BP: 126/56  Pulse: 83  Temp: 98.6 F (37 C)  TempSrc: Oral  Height: 5\' 6"  (1.676 m)  Weight: 134 lb 6.4 oz (60.963 kg)  SpO2: 97%   Gen: Pleasant, thin, in no distress,  normal affect  ENT: No lesions,  mouth clear,  oropharynx clear, no postnasal drip  Neck: No JVD, no TMG, no carotid bruits  Lungs: No use of accessory muscles, clear without rales or rhonchi  Cardiovascular: RRR, heart sounds normal, no murmur or gallops, no peripheral edema  Musculoskeletal: No deformities, no cyanosis or clubbing  Neuro: alert, non focal, mild tremor  Skin: Warm, no lesions or rashes      Assessment & Plan:  Nocturnal hypoxemia Etiology unclear although we certainly need to screen for an rule out airways and parenchymal lung disease. Note that she is on several medications that might suppress her respiratory drive including Abilify, Xanax, Ativan, Cymbalta. She does not desaturate with exertion on a walking oximetry today.  - I will start oxygen at 2 L/m while sleeping - We will perform an ONO after approximately 1 week to ensure that 2 L/m is adequate - Full PFT - chest x-ray at her next visit - I have asked her to review her medications with her psychiatrist to see if any of these can be adjusted to help with her respiratory drive - Follow up in 1 month

## 2013-12-17 NOTE — Patient Instructions (Signed)
We will start oxygen at 2L/minutes while sleeping We will check your oxygen levels at night on the oxygen in about 1 week.  We will perform full pulmonary function testing at your next office visit You need to review your medications with your psychiatrist to assess the potential side effects and contributions to your decreased respiratory drive at night.  Continue to follow with Dr Brett Fairy Follow with Dr Lamonte Sakai next available with full PFT's

## 2013-12-24 ENCOUNTER — Telehealth: Payer: Self-pay | Admitting: Emergency Medicine

## 2013-12-24 DIAGNOSIS — G4734 Idiopathic sleep related nonobstructive alveolar hypoventilation: Secondary | ICD-10-CM

## 2013-12-24 NOTE — Telephone Encounter (Signed)
Called spoke with pt spouse. He reports pt is a night breather and does not really breathe through her nose. They are requesting a mask for pt O2. I advised him for O2 mask-it requires a higher liter flow of at least 6 liters.  I advised pt could try using a chin strap to help keep her mouth closed during sleep but he did not seem to want pt to try this. He wants to know what RB recommends. Please advise thanks

## 2013-12-28 NOTE — Telephone Encounter (Signed)
Order given to ahc Sally E Ottinger ° °

## 2013-12-28 NOTE — Telephone Encounter (Signed)
Called and spoke to pt. Informed pt of recs per RB. Pt stated she will try the chin strap and then call back if it doesn't work. Order placed. Pt verbalized understanding and denied any further questions or concerns at this time.   PCC's- pt has ONO tonight and would like the chinstrap before doing test. Please advise if DME can bring chinstrap before test.

## 2013-12-28 NOTE — Telephone Encounter (Signed)
I think the chin strap would be preferred - it is unusual for [place] to use a mask for this purpose. If she does not think she can use chin strap then OK with me to order O2 via a simple mask qhs. The flow will likely need to be 5-6L/min via her concentrator

## 2013-12-29 ENCOUNTER — Telehealth: Payer: Self-pay | Admitting: Emergency Medicine

## 2013-12-29 NOTE — Telephone Encounter (Signed)
Called and spoke to pt. Pt stated they did not deliver the chin strap the night she had her ONO. Pt stated they delivered the chin strap the next day. Pt also stated she spoke with her psychologist about how her O2 was falling during the night and her psychologist decreased the pt's ativan from 1mg  BID to 0.5mg  BID. Med list updated. Pt requesting this information be sent to Dr. Lamonte Sakai to make aware.   Will send to [place] as FYI. Nothing further needed.

## 2013-12-29 NOTE — Telephone Encounter (Signed)
Lmtcb for pt.   See phone note from 11/12 for more info.

## 2013-12-29 NOTE — Telephone Encounter (Signed)
Pt returning call.Leslie Mccann ° °

## 2013-12-30 NOTE — Telephone Encounter (Signed)
Thanks

## 2014-01-12 ENCOUNTER — Ambulatory Visit (INDEPENDENT_AMBULATORY_CARE_PROVIDER_SITE_OTHER): Payer: Medicare Other | Admitting: Emergency Medicine

## 2014-01-12 ENCOUNTER — Institutional Professional Consult (permissible substitution): Payer: Medicare Other | Admitting: Emergency Medicine

## 2014-01-12 DIAGNOSIS — R0902 Hypoxemia: Secondary | ICD-10-CM

## 2014-01-12 LAB — PULMONARY FUNCTION TEST
DL/VA % PRED: 94 %
DL/VA: 4.63 ml/min/mmHg/L
DLCO UNC % PRED: 88 %
DLCO unc: 22.7 ml/min/mmHg
FEF 25-75 PRE: 2.42 L/s
FEF 25-75 Post: 3.06 L/sec
FEF2575-%CHANGE-POST: 26 %
FEF2575-%PRED-PRE: 120 %
FEF2575-%Pred-Post: 152 %
FEV1-%Change-Post: 4 %
FEV1-%PRED-PRE: 112 %
FEV1-%Pred-Post: 117 %
FEV1-PRE: 2.69 L
FEV1-Post: 2.82 L
FEV1FVC-%Change-Post: 8 %
FEV1FVC-%PRED-PRE: 103 %
FEV6-%CHANGE-POST: -2 %
FEV6-%PRED-POST: 109 %
FEV6-%Pred-Pre: 111 %
FEV6-POST: 3.3 L
FEV6-PRE: 3.38 L
FEV6FVC-%CHANGE-POST: 0 %
FEV6FVC-%PRED-PRE: 103 %
FEV6FVC-%Pred-Post: 104 %
FVC-%Change-Post: -3 %
FVC-%PRED-POST: 104 %
FVC-%PRED-PRE: 108 %
FVC-POST: 3.3 L
FVC-Pre: 3.42 L
POST FEV1/FVC RATIO: 85 %
POST FEV6/FVC RATIO: 100 %
Pre FEV1/FVC ratio: 79 %
Pre FEV6/FVC Ratio: 100 %
RV % pred: 139 %
RV: 3.1 L
TLC % PRED: 123 %
TLC: 6.44 L

## 2014-01-12 NOTE — Progress Notes (Signed)
PFT done today. 

## 2014-01-13 ENCOUNTER — Encounter: Payer: Self-pay | Admitting: Emergency Medicine

## 2014-01-13 ENCOUNTER — Ambulatory Visit (INDEPENDENT_AMBULATORY_CARE_PROVIDER_SITE_OTHER): Payer: Medicare Other | Admitting: Emergency Medicine

## 2014-01-13 VITALS — BP 122/68 | HR 103 | Temp 97.6°F | Ht 66.0 in | Wt 131.2 lb

## 2014-01-13 DIAGNOSIS — G4734 Idiopathic sleep related nonobstructive alveolar hypoventilation: Secondary | ICD-10-CM

## 2014-01-13 NOTE — Progress Notes (Signed)
   Subjective:    Patient ID: Leslie Mccann, female    DOB: 09/13/1945, 68 y.o.   MRN: [id number]  HPI 68 yo woman, former smoker (10 pk-yrs), childhood asthma, diabetes. She was referred to Dr Brett Fairy after an episode of confusion and apraxia concerning for a TIA. She recovered to baseline after this transient episode. Her principle complaint is severe fatigue.  She underwent PSG on 10/14 that showed some evidence for periodic limb movements, no apneas, but extended period of hypoxemia with SpO2 mid-80's%. She is on several meds that can affect ventilatory drive (and fatigue) >  Abilify, xanax, ativan, cymbalta.   ROV 01/13/14 -- follow up visit for dyspnea and exertional hypoxemia.  PFT done 12/1 > no AFL, normal volumes and DLCO. She tried to come down on ativan - had to restart due to increased anxiety / depression. She is having trouble tolerating the [place] O2 due to discomfort with the device. No daytime sx.    Review of Systems  Constitutional: Negative for fever and unexpected weight change.  HENT: Negative for congestion, dental problem, ear pain, nosebleeds, postnasal drip, rhinorrhea, sinus pressure, sneezing, sore throat and trouble swallowing.   Eyes: Negative for redness and itching.  Respiratory: Negative for cough, chest tightness, shortness of breath and wheezing.   Cardiovascular: Negative for palpitations and leg swelling.  Gastrointestinal: Negative for nausea and vomiting.  Genitourinary: Negative for dysuria.  Musculoskeletal: Negative for joint swelling.  Skin: Negative for rash.  Neurological: Negative for headaches.  Hematological: Does not bruise/bleed easily.  Psychiatric/Behavioral: Negative for dysphoric mood. The patient is not nervous/anxious.        Objective:   Physical Exam Filed Vitals:   01/13/14 1356  BP: 122/68  Pulse: 103  Temp: 97.6 F (36.4 C)  TempSrc: Oral  Height: 5\' 6"  (1.676 m)  Weight: 131 lb 3.2 oz (59.512 kg)  SpO2: 98%   Gen:  Pleasant, thin, in no distress,  normal affect  ENT: No lesions,  mouth clear,  oropharynx clear, no postnasal drip  Neck: No JVD, no TMG, no carotid bruits  Lungs: No use of accessory muscles, clear without rales or rhonchi  Cardiovascular: RRR, heart sounds normal, no murmur or gallops, no peripheral edema  Musculoskeletal: No deformities, no cyanosis or clubbing  Neuro: alert, non focal, mild tremor  Skin: Warm, no lesions or rashes      Assessment & Plan:  Nocturnal hypoxemia These are normal. No evidence for intrinsic lung disease. I suspect that her nocturnal hypoxemia is indeed related to her medications that suppress her respiratory drive. PSG has already confirmed that there is no overt obstructive sleep apnea. For now I would continue 2 L/m night. I will order a repeat overnight oximetry on room air to see if she still needs oxygen at all.

## 2014-01-13 NOTE — Addendum Note (Signed)
Addended by: Mathis Dad on: 01/13/2014 02:38 PM   Modules accepted: Orders

## 2014-01-13 NOTE — Assessment & Plan Note (Signed)
These are normal. No evidence for intrinsic lung disease. I suspect that her nocturnal hypoxemia is indeed related to her medications that suppress her respiratory drive. PSG has already confirmed that there is no overt obstructive sleep apnea. For now I would continue 2 L/m night. I will order a repeat overnight oximetry on room air to see if she still needs oxygen at all.

## 2014-01-13 NOTE — Patient Instructions (Signed)
Please continue to wear your oxygen at 2L/min at night We will repeat your overnight oximetry on room air in a month to see if your oxygen level still drops.  Follow with Dr Lamonte Sakai in 6 months or sooner if you have any problems

## 2014-01-19 ENCOUNTER — Telehealth: Payer: Self-pay | Admitting: Emergency Medicine

## 2014-01-19 MED ORDER — FINGERTIP PULSE OXIMETER MISC: Status: AC

## 2014-01-19 NOTE — Telephone Encounter (Signed)
Spoke with the pt  She is asking about rx for pulse ox  I have sent rx for this to the pharmacy per her request  She states that she has not been contacted by [facility] for ONO on RA yet  Order was placed to [facility] for this on 01/13/14  Please advise on status, thanks!

## 2014-01-20 NOTE — Telephone Encounter (Signed)
Message sent to [email or web address]  to ck on this Leslie Mccann

## 2014-01-22 NOTE — Telephone Encounter (Signed)
lmtcb for Melissa.  

## 2014-01-22 NOTE — Telephone Encounter (Signed)
Leslie Mccann calling back they have an ono 11/16 but pt does not qualify need to talk to the nurse they will fax form to [phone]      [phone]

## 2014-01-22 NOTE — Telephone Encounter (Signed)
I spoke with Melissa and she states that the pt had an ONO on 12-29-13 and did not qualify for oxygen. Lenna Sciara is aware that in [place] note he mentions that hypoxemia may be due to the pt meds but Melissa states that Medicare will not pay for oxygen for this reason. THey have reviewed all records and the pt sleep study and there is nothing that will have medicare pay. The only option is for the pt to pay out of pocket for the oxygen if you still want her to have it. Please advise. [place] Bing, CMA

## 2014-01-26 NOTE — Telephone Encounter (Signed)
I need a copy of the 12/29/13 ONO on RA. It may be that she no longer requires.

## 2014-01-26 NOTE — Telephone Encounter (Signed)
Spoke with [facility] ONO RA to be faxed to triage fax per RB request.  Will hold in triage to await fax

## 2014-01-27 NOTE — Telephone Encounter (Signed)
This is in pcc box do we meed to do anything for this pt?

## 2014-01-28 NOTE — Telephone Encounter (Signed)
PCCs do not need to do anything at this time for pt We are waiting on the 11.17.15 ONO on RA as requested by RB but unsure if this has already been received [facility] and spoke with Otila Kluver to request ONO be faxed again to be sure.  However, per [place] that was done on 11.17.15 was actually done on 2lpm.  It appears that pt desaturated during her 10.14.15 sleep study.  Otila Kluver will fax the ONO.  The sleep study has been printed for RB to view.  Both are in his lookat.  Dr B please advise, thank you.

## 2014-01-28 NOTE — Telephone Encounter (Signed)
N OT UNLESS THERE ARE ANY ORDERS Kathleen Lime Ottinger

## 2014-01-28 NOTE — Telephone Encounter (Signed)
lmomtcb x1 for pt 

## 2014-01-28 NOTE — Telephone Encounter (Signed)
Please let pt know that she did not desaturate. She does not qualify for oxygen at night based on these results.Marland Kitchen

## 2014-01-29 ENCOUNTER — Telehealth: Payer: Self-pay | Admitting: Emergency Medicine

## 2014-01-29 MED ORDER — PULSE OXIMETER MISC: 1.0000 | Status: AC

## 2014-01-29 NOTE — Telephone Encounter (Signed)
Spoke with Leslie Mccann, let her know that pulse ox is being sent to [facility] on [address] per pt.  See previous telephone note.  Nothing further needed.

## 2014-01-29 NOTE — Telephone Encounter (Signed)
Yes

## 2014-01-29 NOTE — Telephone Encounter (Signed)
Spouse aware RX for this has been sent to [facility] and [facility] will call today to set up the ONO RA. Nothing further needed

## 2014-01-29 NOTE — Telephone Encounter (Signed)
Melissa called back and confirmed ONO on RA has not been done yet. She will be called today to get this the device delivered to her on Monday. The results RB received are from November and not what he ordered from 01/13/14 OV. Also spouse is requesting to have an RX for pulse OX [facility] to [facility]. Please advise RB thanks

## 2014-01-29 NOTE — Telephone Encounter (Signed)
I may have mistakenly looked at the original ONO. We will wait for the RA results and then sort the orders out

## 2014-01-29 NOTE — Telephone Encounter (Signed)
Called spoke with pt spouse. He reports they have not had the repeat ONO on RA done yet that RB had ordered from last OV 01/13/14. He did not understand how she did not qualify.  I called Melissa to see if pt has even had the ONO on RA done yet since the 1st ONO was done on 2 liters 02. She will call us back.

## 2014-01-29 NOTE — Telephone Encounter (Signed)
Rb in the meantime are you ok with sending the rx for pulse ox to [facility]?

## 2014-05-03 ENCOUNTER — Encounter: Payer: Self-pay | Admitting: Emergency Medicine

## 2014-09-21 ENCOUNTER — Ambulatory Visit (INDEPENDENT_AMBULATORY_CARE_PROVIDER_SITE_OTHER): Payer: Medicare Other | Admitting: Emergency Medicine

## 2014-09-21 ENCOUNTER — Encounter: Payer: Self-pay | Admitting: Emergency Medicine

## 2014-09-21 VITALS — BP 108/62 | HR 92 | Ht 66.0 in | Wt 118.0 lb

## 2014-09-21 DIAGNOSIS — G4734 Idiopathic sleep related nonobstructive alveolar hypoventilation: Secondary | ICD-10-CM | POA: Diagnosis not present

## 2014-09-21 NOTE — Progress Notes (Signed)
   Subjective:    Patient ID: SHANESHA Mccann, female    DOB: 09-02-1945, 69 y.o.   MRN: [id number]  HPI 69 yo woman, former smoker (10 pk-yrs), childhood asthma, diabetes. She was referred to Dr Brett Fairy after an episode of confusion and apraxia concerning for a TIA. She recovered to baseline after this transient episode. Her principle complaint is severe fatigue.  She underwent PSG on 10/14 that showed some evidence for periodic limb movements, no apneas, but extended period of hypoxemia with SpO2 mid-80's%. She is on several meds that can affect ventilatory drive (and fatigue) >  Abilify, xanax, ativan, cymbalta.   ROV 01/13/14 -- follow up visit for dyspnea and exertional hypoxemia.  PFT done 12/1 > no AFL, normal volumes and DLCO. She tried to come down on ativan - had to restart due to increased anxiety / depression. She is having trouble tolerating the [place] O2 due to discomfort with the device. No daytime sx.   ROV 09/21/14 -- follow up visit for nocturnal hypoxemia that we have confirmed on overnight oximetry.  She remains on the same medication regimen. She is tolerating the O2, but tells me that she has not been wearing it for the last month because her dog was ill, was scared of the machine. She plans to restart it.    Review of Systems  Constitutional: Negative for fever and unexpected weight change.  HENT: Negative for congestion, dental problem, ear pain, nosebleeds, postnasal drip, rhinorrhea, sinus pressure, sneezing, sore throat and trouble swallowing.   Eyes: Negative for redness and itching.  Respiratory: Negative for cough, chest tightness, shortness of breath and wheezing.   Cardiovascular: Negative for palpitations and leg swelling.  Gastrointestinal: Negative for nausea and vomiting.  Genitourinary: Negative for dysuria.  Musculoskeletal: Negative for joint swelling.  Skin: Negative for rash.  Neurological: Negative for headaches.  Hematological: Does not bruise/bleed easily.    Psychiatric/Behavioral: Negative for dysphoric mood. The patient is not nervous/anxious.        Objective:   Physical Exam Filed Vitals:   09/21/14 1524  BP: 108/62  Pulse: 92  Height: 5\' 6"  (1.676 m)  Weight: 53.524 kg (118 lb)  SpO2: 96%   Gen: Pleasant, thin, in no distress,  normal affect  ENT: No lesions,  mouth clear,  oropharynx clear, no postnasal drip  Neck: No JVD, no TMG, no carotid bruits  Lungs: No use of accessory muscles, clear without rales or rhonchi  Cardiovascular: RRR, heart sounds normal, no murmur or gallops, no peripheral edema  Musculoskeletal: No deformities, no cyanosis or clubbing  Neuro: alert, non focal, mild tremor  Skin: Warm, no lesions or rashes      Assessment & Plan:  Nocturnal hypoxemia Documented nocturnal hypoxemia, likely due to sedating meds and hypoventilation. She has not been using recently but plans to restart. This will be our plan. Continue 1L/min unless her medical regimen changes at which time we can reassess her needs.

## 2014-09-21 NOTE — Patient Instructions (Signed)
Please restart your oxygen at night, 1L/min.  Follow with Dr Lamonte Sakai in 12 months or sooner if you have any problems

## 2015-04-27 ENCOUNTER — Encounter (HOSPITAL_COMMUNITY): Payer: Self-pay | Admitting: Family Medicine

## 2015-04-27 ENCOUNTER — Observation Stay (HOSPITAL_COMMUNITY)
Admission: EM | Admit: 2015-04-27 | Discharge: 2015-04-29 | Disposition: A | Discharge status: Home / Self Care | Payer: Medicare Other | Attending: Family Medicine | Admitting: Family Medicine

## 2015-04-27 ENCOUNTER — Emergency Department (HOSPITAL_COMMUNITY): Payer: Medicare Other

## 2015-04-27 DIAGNOSIS — Z87891 Personal history of nicotine dependence: Secondary | ICD-10-CM | POA: Insufficient documentation

## 2015-04-27 DIAGNOSIS — E038 Other specified hypothyroidism: Secondary | ICD-10-CM

## 2015-04-27 DIAGNOSIS — R9401 Abnormal electroencephalogram [EEG]: Secondary | ICD-10-CM | POA: Diagnosis not present

## 2015-04-27 DIAGNOSIS — E119 Type 2 diabetes mellitus without complications: Secondary | ICD-10-CM

## 2015-04-27 DIAGNOSIS — Z885 Allergy status to narcotic agent status: Secondary | ICD-10-CM | POA: Insufficient documentation

## 2015-04-27 DIAGNOSIS — F419 Anxiety disorder, unspecified: Secondary | ICD-10-CM | POA: Diagnosis not present

## 2015-04-27 DIAGNOSIS — R0902 Hypoxemia: Secondary | ICD-10-CM | POA: Diagnosis not present

## 2015-04-27 DIAGNOSIS — E039 Hypothyroidism, unspecified: Secondary | ICD-10-CM | POA: Diagnosis present

## 2015-04-27 DIAGNOSIS — R2689 Other abnormalities of gait and mobility: Secondary | ICD-10-CM

## 2015-04-27 DIAGNOSIS — G3189 Other specified degenerative diseases of nervous system: Secondary | ICD-10-CM | POA: Diagnosis not present

## 2015-04-27 DIAGNOSIS — M50321 Other cervical disc degeneration at C4-C5 level: Secondary | ICD-10-CM | POA: Insufficient documentation

## 2015-04-27 DIAGNOSIS — E878 Other disorders of electrolyte and fluid balance, not elsewhere classified: Secondary | ICD-10-CM | POA: Diagnosis not present

## 2015-04-27 DIAGNOSIS — Z794 Long term (current) use of insulin: Secondary | ICD-10-CM | POA: Insufficient documentation

## 2015-04-27 DIAGNOSIS — E871 Hypo-osmolality and hyponatremia: Secondary | ICD-10-CM | POA: Diagnosis present

## 2015-04-27 DIAGNOSIS — R4789 Other speech disturbances: Secondary | ICD-10-CM | POA: Insufficient documentation

## 2015-04-27 DIAGNOSIS — R479 Unspecified speech disturbances: Secondary | ICD-10-CM | POA: Diagnosis present

## 2015-04-27 DIAGNOSIS — E11319 Type 2 diabetes mellitus with unspecified diabetic retinopathy without macular edema: Secondary | ICD-10-CM | POA: Diagnosis not present

## 2015-04-27 DIAGNOSIS — R569 Unspecified convulsions: Secondary | ICD-10-CM | POA: Insufficient documentation

## 2015-04-27 DIAGNOSIS — F329 Major depressive disorder, single episode, unspecified: Secondary | ICD-10-CM | POA: Insufficient documentation

## 2015-04-27 DIAGNOSIS — R4701 Aphasia: Secondary | ICD-10-CM | POA: Insufficient documentation

## 2015-04-27 DIAGNOSIS — Z888 Allergy status to other drugs, medicaments and biological substances status: Secondary | ICD-10-CM | POA: Diagnosis not present

## 2015-04-27 DIAGNOSIS — Z8673 Personal history of transient ischemic attack (TIA), and cerebral infarction without residual deficits: Secondary | ICD-10-CM | POA: Diagnosis not present

## 2015-04-27 DIAGNOSIS — Z882 Allergy status to sulfonamides status: Secondary | ICD-10-CM | POA: Diagnosis not present

## 2015-04-27 DIAGNOSIS — G9340 Encephalopathy, unspecified: Secondary | ICD-10-CM | POA: Diagnosis present

## 2015-04-27 DIAGNOSIS — G4734 Idiopathic sleep related nonobstructive alveolar hypoventilation: Secondary | ICD-10-CM | POA: Diagnosis present

## 2015-04-27 DIAGNOSIS — I080 Rheumatic disorders of both mitral and aortic valves: Secondary | ICD-10-CM | POA: Insufficient documentation

## 2015-04-27 DIAGNOSIS — I739 Peripheral vascular disease, unspecified: Secondary | ICD-10-CM | POA: Insufficient documentation

## 2015-04-27 DIAGNOSIS — R251 Tremor, unspecified: Secondary | ICD-10-CM | POA: Insufficient documentation

## 2015-04-27 DIAGNOSIS — IMO0001 Reserved for inherently not codable concepts without codable children: Secondary | ICD-10-CM

## 2015-04-27 DIAGNOSIS — Q211 Atrial septal defect: Secondary | ICD-10-CM | POA: Diagnosis not present

## 2015-04-27 LAB — CBG MONITORING, ED
Glucose-Capillary: 331 mg/dL — ABNORMAL HIGH (ref 65–99)
Glucose-Capillary: 116 mg/dL — ABNORMAL HIGH (ref 65–99)
Glucose-Capillary: 210 mg/dL — ABNORMAL HIGH (ref 65–99)

## 2015-04-27 LAB — I-STAT TROPONIN, ED: Troponin i, poc: 0 ng/mL (ref 0.00–0.08)

## 2015-04-27 LAB — CBC
HCT: 38.4 % (ref 36.0–46.0)
HEMOGLOBIN: 12.7 g/dL (ref 12.0–15.0)
MCH: 33.3 pg (ref 26.0–34.0)
MCHC: 33.1 g/dL (ref 30.0–36.0)
MCV: 100.8 fL — ABNORMAL HIGH (ref 78.0–100.0)
Platelets: 269 10*3/uL (ref 150–400)
RBC: 3.81 MIL/uL — AB (ref 3.87–5.11)
RDW: 13.8 % (ref 11.5–15.5)
WBC: 5.8 10*3/uL (ref 4.0–10.5)

## 2015-04-27 LAB — COMPREHENSIVE METABOLIC PANEL
ALBUMIN: 3.8 g/dL (ref 3.5–5.0)
ALK PHOS: 79 U/L (ref 38–126)
ALT: 28 U/L (ref 14–54)
AST: 28 U/L (ref 15–41)
Anion gap: 9 (ref 5–15)
BILIRUBIN TOTAL: 0.5 mg/dL (ref 0.3–1.2)
BUN: 15 mg/dL (ref 6–20)
CALCIUM: 8.8 mg/dL — AB (ref 8.9–10.3)
CO2: 29 mmol/L (ref 22–32)
Chloride: 96 mmol/L — ABNORMAL LOW (ref 101–111)
Creatinine, Ser: 0.71 mg/dL (ref 0.44–1.00)
GFR calc Af Amer: >60 mL/min (ref >60)
GLUCOSE: 378 mg/dL — AB (ref 65–99)
Potassium: 4.2 mmol/L (ref 3.5–5.1)
Sodium: 134 mmol/L — ABNORMAL LOW (ref 135–145)
TOTAL PROTEIN: 6.7 g/dL (ref 6.5–8.1)

## 2015-04-27 LAB — URINALYSIS, ROUTINE W REFLEX MICROSCOPIC
BILIRUBIN URINE: NEGATIVE
HGB URINE DIPSTICK: NEGATIVE
Ketones, ur: NEGATIVE mg/dL
Leukocytes, UA: NEGATIVE
Nitrite: NEGATIVE
PH: 6.5 (ref 5.0–8.0)
Protein, ur: NEGATIVE mg/dL
SPECIFIC GRAVITY, URINE: 1.014 (ref 1.005–1.030)

## 2015-04-27 LAB — I-STAT CHEM 8, ED
BUN: 15 mg/dL (ref 6–20)
CALCIUM ION: 1.13 mmol/L (ref 1.13–1.30)
CHLORIDE: 95 mmol/L — AB (ref 101–111)
Creatinine, Ser: 0.6 mg/dL (ref 0.44–1.00)
GLUCOSE: 367 mg/dL — AB (ref 65–99)
HCT: 43 % (ref 36.0–46.0)
Hemoglobin: 14.6 g/dL (ref 12.0–15.0)
Potassium: 4.2 mmol/L (ref 3.5–5.1)
Sodium: 134 mmol/L — ABNORMAL LOW (ref 135–145)
TCO2: 28 mmol/L (ref 0–100)

## 2015-04-27 LAB — URINE MICROSCOPIC-ADD ON

## 2015-04-27 LAB — APTT: APTT: 26 s (ref 24–37)

## 2015-04-27 LAB — DIFFERENTIAL
Basophils Absolute: 0 10*3/uL (ref 0.0–0.1)
Basophils Relative: 0 %
Eosinophils Absolute: 0.1 10*3/uL (ref 0.0–0.7)
Eosinophils Relative: 2 %
LYMPHS ABS: 1.4 10*3/uL (ref 0.7–4.0)
LYMPHS PCT: 24 %
MONOS PCT: 8 %
Monocytes Absolute: 0.4 10*3/uL (ref 0.1–1.0)
NEUTROS PCT: 66 %
Neutro Abs: 3.8 10*3/uL (ref 1.7–7.7)

## 2015-04-27 LAB — RAPID URINE DRUG SCREEN, HOSP PERFORMED
Amphetamines: NOT DETECTED
BARBITURATES: NOT DETECTED
Benzodiazepines: NOT DETECTED
Cocaine: NOT DETECTED
Opiates: NOT DETECTED
TETRAHYDROCANNABINOL: NOT DETECTED

## 2015-04-27 LAB — PROTIME-INR
INR: 0.93 (ref 0.00–1.49)
Prothrombin Time: 12.6 seconds (ref 11.6–15.2)

## 2015-04-27 LAB — ETHANOL

## 2015-04-27 MED ORDER — ENOXAPARIN SODIUM 40 MG/0.4ML ~~LOC~~ SOLN
40.0000 mg | SUBCUTANEOUS | Status: DC
  Administered 2015-04-27 – 2015-04-28 (×2): 40 mg via SUBCUTANEOUS
  Filled 2015-04-27 (×4): qty 0.4

## 2015-04-27 MED ORDER — DULOXETINE HCL 20 MG PO CPEP
40.0000 mg | ORAL_CAPSULE | Freq: Every day | ORAL | Status: DC
  Administered 2015-04-28 – 2015-04-29 (×2): 40 mg via ORAL
  Filled 2015-04-27 (×5): qty 2

## 2015-04-27 MED ORDER — SODIUM CHLORIDE 0.9 % IV SOLN
INTRAVENOUS | Status: AC
  Administered 2015-04-28: 75 mL/h via INTRAVENOUS

## 2015-04-27 MED ORDER — INSULIN ASPART 100 UNIT/ML ~~LOC~~ SOLN
6.0000 [IU] | Freq: Once | SUBCUTANEOUS | Status: AC
  Administered 2015-04-27: 6 [IU] via SUBCUTANEOUS
  Filled 2015-04-27: qty 1

## 2015-04-27 MED ORDER — LEVOTHYROXINE SODIUM 100 MCG PO TABS
100.0000 ug | ORAL_TABLET | Freq: Every day | ORAL | Status: DC
  Administered 2015-04-28 – 2015-04-29 (×2): 100 ug via ORAL
  Filled 2015-04-27 (×4): qty 1

## 2015-04-27 MED ORDER — INSULIN ASPART 100 UNIT/ML ~~LOC~~ SOLN
0.0000 [IU] | Freq: Three times a day (TID) | SUBCUTANEOUS | Status: DC
  Administered 2015-04-28: 0 [IU] via SUBCUTANEOUS
  Administered 2015-04-28: 5 [IU] via SUBCUTANEOUS
  Administered 2015-04-28: 8 [IU] via SUBCUTANEOUS
  Administered 2015-04-29 (×2): 3 [IU] via SUBCUTANEOUS

## 2015-04-27 MED ORDER — RALOXIFENE HCL 60 MG PO TABS
60.0000 mg | ORAL_TABLET | Freq: Every evening | ORAL | Status: DC
  Administered 2015-04-28 (×2): 60 mg via ORAL
  Filled 2015-04-27 (×4): qty 1

## 2015-04-27 MED ORDER — INSULIN GLARGINE 100 UNIT/ML ~~LOC~~ SOLN
8.0000 [IU] | Freq: Every day | SUBCUTANEOUS | Status: DC
  Administered 2015-04-28 (×2): 8 [IU] via SUBCUTANEOUS
  Filled 2015-04-27 (×3): qty 0.08

## 2015-04-27 MED ORDER — TRAMADOL HCL 50 MG PO TABS: 50.0000 mg | ORAL_TABLET | Freq: Four times a day (QID) | ORAL | Status: DC | PRN

## 2015-04-27 MED ORDER — VITAMIN D (ERGOCALCIFEROL) 1.25 MG (50000 UNIT) PO CAPS
50000.0000 [IU] | ORAL_CAPSULE | ORAL | Status: DC
  Administered 2015-04-27: 50000 [IU] via ORAL
  Filled 2015-04-27: qty 1

## 2015-04-27 MED ORDER — ARIPIPRAZOLE 2 MG PO TABS
2.0000 mg | ORAL_TABLET | Freq: Every evening | ORAL | Status: DC
  Administered 2015-04-28 (×2): 2 mg via ORAL
  Filled 2015-04-27 (×5): qty 1

## 2015-04-27 MED ORDER — LORAZEPAM 1 MG PO TABS
1.0000 mg | ORAL_TABLET | Freq: Two times a day (BID) | ORAL | Status: DC
  Administered 2015-04-28 – 2015-04-29 (×4): 1 mg via ORAL
  Filled 2015-04-27 (×4): qty 1

## 2015-04-27 MED ORDER — ASPIRIN 325 MG PO TABS
325.0000 mg | ORAL_TABLET | Freq: Every day | ORAL | Status: DC
  Administered 2015-04-28 – 2015-04-29 (×2): 325 mg via ORAL
  Filled 2015-04-27 (×2): qty 1

## 2015-04-27 MED ORDER — SENNOSIDES-DOCUSATE SODIUM 8.6-50 MG PO TABS: 1.0000 | ORAL_TABLET | Freq: Every evening | ORAL | Status: DC | PRN

## 2015-04-27 NOTE — ED Notes (Addendum)
EDP at bedside. NEGATIVE FOR STROKE AT THIS TIME

## 2015-04-27 NOTE — ED Notes (Signed)
Pt is unable to give this RN the Presidents name though she states she knows who he is and "hates" him.  She is also unable to tell me where she is though she states that she knows the word for it, just cannot find it.

## 2015-04-27 NOTE — ED Provider Notes (Signed)
CSN: [id number]     Arrival date & time 04/27/15  1638 History   First MD Initiated Contact with Patient 04/27/15 1717     Chief Complaint  Patient presents with  . Memory Loss  . Altered Mental Status     (Consider location/radiation/quality/duration/timing/severity/associated sxs/prior Treatment) HPI  70 year old female presents with trouble speaking and memory issues. Last known well was sometime between 10 and 11 AM. Patient has had difficulty remembering her birthdate while at the doctor's office and when she is speaking it seems like sometimes the correct word just doesn't come out. There is no slurred speech. There is no facial droop. No weakness or numbness. No blurred vision. Has not been having headaches or chest pain. Is has been essentially constant since it started. One time had similar symptoms about 2 years ago, does not know what the diagnosis was.  Past Medical History  Diagnosis Date  . Diabetes mellitus   . Depression   . Anxiety   . Retinopathy   . TIA (transient ischemic attack) 10/16/2013  . Diabetic retinopathy ([place]) 10/16/2013  . Snorings 10/16/2013  . 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 Asthma     childhood  . Tremors of nervous system    Past Surgical History  Procedure Laterality Date  . Eye surgeries     . Cataract extraction, bilateral    . Colonoscopy     Family History  Problem Relation Age of Onset  . Cancer Mother   . COPD Father   . Diabetes Father   . Hypertension Maternal Grandmother   . Stroke Maternal Grandmother   . Stroke Maternal Grandfather   . Cancer Maternal Grandfather   . Hypothyroidism Daughter    Social History  Substance Use Topics  . Smoking status: Former Smoker -- 1.00 packs/day for 10 years    Types: Cigarettes    Quit date: 02/12/1998  . Smokeless tobacco: Never Used  . Alcohol Use: 0.0 oz/week    0 Standard drinks or equivalent per week     Comment: occassional    OB History    No data available     Review of  Systems  Respiratory: Negative for shortness of breath.   Cardiovascular: Negative for chest pain.  Gastrointestinal: Negative for vomiting and abdominal pain.  Genitourinary: Negative for dysuria and hematuria.  Neurological: Positive for speech difficulty. Negative for dizziness, weakness, light-headedness, numbness and headaches.  All other systems reviewed and are negative.     Allergies  Codeine; Sulfa antibiotics; and Adhesive  Home Medications   Prior to Admission medications   Medication Sig Start Date End Date Taking? Authorizing Provider  ARIPiprazole (ABILIFY) 2 MG tablet Take 2 mg by mouth every evening.    Yes Historical Provider, MD  DULoxetine (CYMBALTA) 20 MG capsule Take 40 mg by mouth daily.    Yes Historical Provider, MD  insulin glargine (LANTUS) 100 UNIT/ML injection Inject 12 Units into the skin at bedtime.   Yes Historical Provider, MD  insulin lispro (HUMALOG) 100 UNIT/ML injection Inject 4-8 Units into the skin 3 (three) times daily before meals.   Yes Historical Provider, MD  levothyroxine (SYNTHROID, LEVOTHROID) 100 MCG tablet Take 100 mcg by mouth every morning.    Yes Historical Provider, MD  LORazepam (ATIVAN) 1 MG tablet Take 1 mg by mouth 2 (two) times daily.    Yes Historical Provider, MD  raloxifene (EVISTA) 60 MG tablet Take 60 mg by mouth every evening.    Yes Historical Provider, MD  Misc. Devices (FINGERTIP PULSE OXIMETER) MISC Use as directed 01/19/14   Collene Gobble, MD  Misc. Devices (PULSE OXIMETER) MISC 1 Device by Does not apply route as directed. 01/29/14   Collene Gobble, MD  ONE TOUCH ULTRA TEST test strip  11/25/13   Historical Provider, MD  traMADol (ULTRAM) 50 MG tablet Take 50 mg by mouth every 6 (six) hours as needed for moderate pain or severe pain. As needed 07/21/13   Historical Provider, MD  Vitamin D, Ergocalciferol, (DRISDOL) 50000 UNITS CAPS capsule Take 50,000 Units by mouth every 7 (seven) days.  12/16/13   Historical Provider, MD    BP 170/88 mmHg  Pulse 93  Temp(Src) 97.9 F (36.6 C) (Oral)  Resp 19  Wt 128 lb (58.06 kg)  SpO2 100% Physical Exam  Constitutional: She is oriented to person, place, and time. She appears well-developed and well-nourished.  HENT:  Head: Normocephalic and atraumatic.  Right Ear: External ear normal.  Left Ear: External ear normal.  Nose: Nose normal.  Eyes: EOM are normal. Pupils are equal, round, and reactive to light. Right eye exhibits no discharge. Left eye exhibits no discharge.  Neck: Neck supple.  Cardiovascular: Normal rate, regular rhythm and normal heart sounds.   Pulmonary/Chest: Effort normal and breath sounds normal.  Abdominal: Soft. She exhibits no distension. There is no tenderness.  Neurological: She is alert and oriented to person, place, and time.  CN 2-12 grossly intact. 5/5 strength in all 4 extremities. Normal sensation. Seems to have some trouble speaking, speaks normally but then catches intermittently and can't seem to get her word(s) out  Skin: Skin is warm and dry.  Nursing note and vitals reviewed.   ED Course  Procedures (including critical care time) Labs Review Labs Reviewed  CBC - Abnormal; Notable for the following:    RBC 3.81 (*)    MCV 100.8 (*)    All other components within normal limits  COMPREHENSIVE METABOLIC PANEL - Abnormal; Notable for the following:    Sodium 134 (*)    Chloride 96 (*)    Glucose, Bld 378 (*)    Calcium 8.8 (*)    All other components within normal limits  CBG MONITORING, ED - Abnormal; Notable for the following:    Glucose-Capillary 331 (*)    All other components within normal limits  I-STAT CHEM 8, ED - Abnormal; Notable for the following:    Sodium 134 (*)    Chloride 95 (*)    Glucose, Bld 367 (*)    All other components within normal limits  PROTIME-INR  APTT  DIFFERENTIAL  URINE RAPID DRUG SCREEN, HOSP PERFORMED  URINALYSIS, ROUTINE W REFLEX MICROSCOPIC (NOT AT [facility])  I-STAT TROPOININ, ED     Imaging Review Ct Head Wo Contrast  04/27/2015  CLINICAL DATA:  Confusion and difficulty speaking EXAM: CT HEAD WITHOUT CONTRAST TECHNIQUE: Contiguous axial images were obtained from the base of the skull through the vertex without intravenous contrast. COMPARISON:  10/05/2013 FINDINGS: Bony calvarium is intact. Mild atrophic changes are seen. No findings to suggest acute hemorrhage, acute infarction or space-occupying mass lesion are seen. A a small lacunar infarct is noted in the anterior aspect of the thalamus on the right. IMPRESSION: Chronic atrophic and ischemic changes without acute abnormality. Electronically Signed   By: Inez Catalina M.D.   On: 04/27/2015 17:16   I have personally reviewed and evaluated these images and lab results as part of my medical decision-making.   EKG  Interpretation   Date/Time:  Wednesday April 27 2015 17:31:43 EDT Ventricular Rate:  91 PR Interval:  144 QRS Duration: 80 QT Interval:  381 QTC Calculation: 469 R Axis:   67 Text Interpretation:  Sinus rhythm Nonspecific T abnormalities, lateral  leads Confirmed by Shizue Kaseman  MD, Ketrick Matney ([id number]) on 04/27/2015 5:54:04 PM      MDM   Final diagnoses:  Difficulty speaking    Patient is not a candidate for a code stroke due to unclear time of LSN, most proximal time that we are clear on is around 10 AM which puts patient out of TPA window. D/w Dr. Silverio Decamp of neuro who agrees. No focal deficits besides speech difficulties and reported memory problems. Discussed with hospitalist who will admit for CVA workup. To be transferred to [facility] for admission.    Sherwood Gambler, MD 04/28/15 0002

## 2015-04-27 NOTE — H&P (Signed)
[facility] History and Physical  Leslie Mccann [id number] DOB: November 22, 1945 DOA: 04/27/2015  Referring physician: ED physician PCP: Sheela Stack, MD  Specialists: Dr. Lamonte Sakai (pulmonology)   Chief Complaint:  Speech disturbance   HPI: Leslie Mccann is a 70 y.o. female with PMH of insulin-dependent diabetes mellitus, anxiety, depression, and hypothyroidism who presents to the ED from her PCPs office with speech difficulty. Patient had reportedly been in her usual state of health when she woke this morning, but her husband does note that her chronic hand tremor seemed to be worse at breakfast. She went about her usual activities throughout the morning and saw her PCP in the afternoon for a regularly scheduled diabetes follow-up appointment. While filling out forms and the PCP office waiting room, she was unable to recall her birth date or the last name of her daughter. She also described difficulty with word finding. She denies any preceding fevers, chills, chest pain, palpitations, or headaches. She denies any focal weakness or numbness. She denies vertigo or loss of coordination per se, but notes a vague sensation of instability while reclined in the hospital bed. She reports similar symptoms approximately 2 years ago for which she underwent outpatient neurology evaluation. Symptoms spontaneously resolved at that time and no etiology was ever determined. Given the acute cognitive difficulty, she was directed from the PCP office to the ED for further evaluation.  In ED, patient was found to be afebrile, saturating well on room air, and with vital signs stable. Head CT was negative for acute intracranial abnormality but does demonstrate chronic atrophy and ischemic change. EKG features a sinus rhythm with nonspecific T-wave abnormality in the lateral leads. CBC is essentially normal, troponin is undetectable, and CMP is notable for serum glucose of 378 with a mild hyponatremia and  hypochloremia. Patient was given 6 units of NovoLog in the ED and on-call neurologist was consulted by EDP. Dr. Silverio Decamp neurology kindly accepts the consultation and recommends admission to [facility] for possible EEG and CVA workup.  Where does patient live?   At home     Can patient participate in ADLs?  Yes        Review of Systems:  Unable to obtain ROS at this time secondary to patient's clinical condition with speech difficulty.    Allergy:  Allergies  Allergen Reactions  . Codeine Nausea And Vomiting  . Sulfa Antibiotics Nausea And Vomiting  . Adhesive [Tape] Rash    Past Medical History  Diagnosis Date  . Diabetes mellitus   . Depression   . Anxiety   . Retinopathy   . TIA (transient ischemic attack) 10/16/2013  . Diabetic retinopathy ([place]) 10/16/2013  . Snorings 10/16/2013  . 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 Asthma     childhood  . Tremors of nervous system     Past Surgical History  Procedure Laterality Date  . Eye surgeries     . Cataract extraction, bilateral    . Colonoscopy      Social History:  reports that she quit smoking about 17 years ago. Her smoking use included Cigarettes. She has a 10 pack-year smoking history. She has never used smokeless tobacco. She reports that she drinks alcohol. She reports that she does not use illicit drugs.  Family History:  Family History  Problem Relation Age of Onset  . Cancer Mother   . COPD Father   . Diabetes Father   . Hypertension Maternal Grandmother   . Stroke Maternal Grandmother   .  Stroke Maternal Grandfather   . Cancer Maternal Grandfather   . Hypothyroidism Daughter      Prior to Admission medications   Medication Sig Start Date End Date Taking? Authorizing Provider  ARIPiprazole (ABILIFY) 2 MG tablet Take 2 mg by mouth every evening.    Yes Historical Provider, MD  DULoxetine (CYMBALTA) 20 MG capsule Take 40 mg by mouth daily.    Yes Historical Provider, MD  insulin glargine (LANTUS) 100  UNIT/ML injection Inject 12 Units into the skin at bedtime.   Yes Historical Provider, MD  insulin lispro (HUMALOG) 100 UNIT/ML injection Inject 4-8 Units into the skin 3 (three) times daily before meals.   Yes Historical Provider, MD  levothyroxine (SYNTHROID, LEVOTHROID) 100 MCG tablet Take 100 mcg by mouth every morning.    Yes Historical Provider, MD  LORazepam (ATIVAN) 1 MG tablet Take 1 mg by mouth 2 (two) times daily.    Yes Historical Provider, MD  raloxifene (EVISTA) 60 MG tablet Take 60 mg by mouth every evening.    Yes Historical Provider, MD  Misc. Devices (FINGERTIP PULSE OXIMETER) MISC Use as directed 01/19/14   Collene Gobble, MD  Misc. Devices (PULSE OXIMETER) MISC 1 Device by Does not apply route as directed. 01/29/14   Collene Gobble, MD  ONE TOUCH ULTRA TEST test strip  11/25/13   Historical Provider, MD  traMADol (ULTRAM) 50 MG tablet Take 50 mg by mouth every 6 (six) hours as needed for moderate pain or severe pain. As needed 07/21/13   Historical Provider, MD  Vitamin D, Ergocalciferol, (DRISDOL) 50000 UNITS CAPS capsule Take 50,000 Units by mouth every 7 (seven) days.  12/16/13   Historical Provider, MD    Physical Exam: Filed Vitals:   04/27/15 1648 04/27/15 1830 04/27/15 1905  BP: 151/87 170/88   Pulse: 86 93   Temp: 98 F (36.7 C)  97.9 F (36.6 C)  TempSrc: Oral    Resp: 16 19   Weight: 58.06 kg (128 lb)    SpO2: 99% 100%    General: Not in acute distress HEENT:       Eyes: PERRL, EOMI, no scleral icterus or conjunctival pallor.       ENT: No discharge from the ears or nose, no pharyngeal ulcers, petechiae or exudate, no tonsillar enlargement.        Neck: No JVD, no bruit, no appreciable mass Heme: No cervical adenopathy, no pallor Cardiac: S1/S2, RRR, No murmurs, No gallops or rubs. Pulm: Good air movement bilaterally. No rales, wheezing, rhonchi or rubs. Abd: Soft, nondistended, nontender, no rebound pain or gaurding, no mass or organomegaly, BS  present. Ext: No LE edema bilaterally. 2+DP/PT pulse bilaterally. Musculoskeletal: No gross deformity, no red, hot, swollen joints  Skin: No rashes or wounds on exposed surfaces  Neuro: Alert, unable to find words to answer questioning, repeating that she is frustrated, following commands, cranial nerves II-XII grossly intact, muscle strength 5/5 in all extremities, sensation to light touch intact. Brachial reflex 2+ bilaterally. Knee reflex 2+ bilaterally. Negative Babinski's sign. Dysmetria with finger to nose test.  Tremulous UEs.  Psych: Patient is not overtly psychotic, appears anxious, frustrated by current condition.  Labs on Admission:  Basic Metabolic Panel:  Recent Labs Lab 04/27/15 1717 04/27/15 1724  NA 134* 134*  K 4.2 4.2  CL 96* 95*  CO2 29  --   GLUCOSE 378* 367*  BUN 15 15  CREATININE 0.71 0.60  CALCIUM 8.8*  --  Liver Function Tests:  Recent Labs Lab 04/27/15 1717  AST 28  ALT 28  ALKPHOS 79  BILITOT 0.5  PROT 6.7  ALBUMIN 3.8   No results for input(s): LIPASE, AMYLASE in the last 168 hours. No results for input(s): AMMONIA in the last 168 hours. CBC:  Recent Labs Lab 04/27/15 1717 04/27/15 1724  WBC 5.8  --   NEUTROABS 3.8  --   HGB 12.7 14.6  HCT 38.4 43.0  MCV 100.8*  --   PLT 269  --    Cardiac Enzymes: No results for input(s): CKTOTAL, CKMB, CKMBINDEX, TROPONINI in the last 168 hours.  BNP (last 3 results) No results for input(s): BNP in the last 8760 hours.  ProBNP (last 3 results) No results for input(s): PROBNP in the last 8760 hours.  CBG:  Recent Labs Lab 04/27/15 1701  GLUCAP 331*    Radiological Exams on Admission: Ct Head Wo Contrast  04/27/2015  CLINICAL DATA:  Confusion and difficulty speaking EXAM: CT HEAD WITHOUT CONTRAST TECHNIQUE: Contiguous axial images were obtained from the base of the skull through the vertex without intravenous contrast. COMPARISON:  10/05/2013 FINDINGS: Bony calvarium is intact. Mild  atrophic changes are seen. No findings to suggest acute hemorrhage, acute infarction or space-occupying mass lesion are seen. A a small lacunar infarct is noted in the anterior aspect of the thalamus on the right. IMPRESSION: Chronic atrophic and ischemic changes without acute abnormality. Electronically Signed   By: Inez Catalina M.D.   On: 04/27/2015 17:16    EKG: Independently reviewed.  Abnormal findings:  Sinus rhythm, non-specific T-wave abnormality in lateral leads   Assessment/Plan  1. Acute encephalopathy vs CVA - Anomic aphasia, or word-finding difficulty is the dominant feature - Chronic hand tremor has worsened since this am, possibly explained by situational anxiety, but also noted to have dysmetria with finger-to-nose testing  - There is no appreciable focal weakness or numbness   - Non-con head CT without acute abnormality  - Hyperglycemic to 378 on arrival, but labs o/w largely unremarkable  - Neurology consulted from the ED at [facility] and recommends admission to [facility] for stroke w/u and possible EEG  - Check MRI/MRA brain, carotid dopplers, TTE, A1c, lipid panel  - ASA for ppx  - Control sugars, correct lytes as below   2. Hyponatremia, hypochloremia  - Mild, anticipate resolution with gentle IVF hydration  - NS at 75 cc/hr overnight  - BMP can be repeated to confirm resolution    3. Insulin-dependent DM type II - Reports recent hypoglycemic episodes and had seen PCP today as they titrate her insulins  - Start with Lantus 8 units qHS with moderate-intensity sliding-scale Novolog correctional  - Check CBG with meals and qHS, adjust regimen prn  - No A1c on file, will send  - Carb-modified diet once swallow-screen passed   4. Hypothyroidism  - Appears to be stable  - Will check thyroid studies given possible encephalopathy   - Continue home-dose Synthroid for now   5. Anxiety, depression  - Chronic, acutely worse d/t current situation  - Continue home-dose Cymbalta and  Abilify  - Continue home-dose Ativan prn  - Monitor      DVT ppx:  SQ Lovenox      Code Status: Full code Family Communication:  Yes, patient's husband at bed side Disposition Plan: Admit to inpatient   Date of Service 04/27/2015    Vianne Bulls, MD [facility] Pager [phone]  If 7PM-7AM, please contact  night-coverage [email or web address] Password [facility] 04/27/2015, 7:54 PM

## 2015-04-27 NOTE — ED Notes (Signed)
Pt's vistior was concerned with high blood sugar but is now concerned about it being low.

## 2015-04-27 NOTE — ED Notes (Signed)
Bed: WA09 Expected date:  Expected time:  Means of arrival:  Comments: TRIAGE 1

## 2015-04-27 NOTE — ED Notes (Signed)
EDP at bedside  

## 2015-04-27 NOTE — ED Notes (Signed)
MD at bedside. ADMISSION MD PRESENT SPEAKING WITH PT AND HUSBAND

## 2015-04-27 NOTE — ED Notes (Signed)
EDP at bedside. Pt and family made aware of admission to [facility]

## 2015-04-27 NOTE — ED Notes (Signed)
Pt presents today from Dr. Reynold Bowen at [facility] office where she was unable to give her birthdate.  Pt has been having difficulty finding words today. Dr. Forde Dandy did blood work today.

## 2015-04-28 ENCOUNTER — Ambulatory Visit (HOSPITAL_COMMUNITY): Payer: Medicare Other

## 2015-04-28 ENCOUNTER — Observation Stay (HOSPITAL_COMMUNITY): Payer: Medicare Other

## 2015-04-28 ENCOUNTER — Observation Stay (HOSPITAL_BASED_OUTPATIENT_CLINIC_OR_DEPARTMENT_OTHER): Payer: Medicare Other

## 2015-04-28 DIAGNOSIS — R479 Unspecified speech disturbances: Secondary | ICD-10-CM | POA: Diagnosis not present

## 2015-04-28 DIAGNOSIS — R2689 Other abnormalities of gait and mobility: Secondary | ICD-10-CM

## 2015-04-28 DIAGNOSIS — E119 Type 2 diabetes mellitus without complications: Secondary | ICD-10-CM | POA: Diagnosis not present

## 2015-04-28 DIAGNOSIS — R569 Unspecified convulsions: Secondary | ICD-10-CM

## 2015-04-28 DIAGNOSIS — G4734 Idiopathic sleep related nonobstructive alveolar hypoventilation: Secondary | ICD-10-CM | POA: Diagnosis not present

## 2015-04-28 DIAGNOSIS — R490 Dysphonia: Secondary | ICD-10-CM

## 2015-04-28 DIAGNOSIS — R0902 Hypoxemia: Secondary | ICD-10-CM | POA: Diagnosis not present

## 2015-04-28 LAB — GLUCOSE, CAPILLARY
GLUCOSE-CAPILLARY: 258 mg/dL — AB (ref 65–99)
GLUCOSE-CAPILLARY: 162 mg/dL — AB (ref 65–99)
Glucose-Capillary: 214 mg/dL — ABNORMAL HIGH (ref 65–99)
Glucose-Capillary: 102 mg/dL — ABNORMAL HIGH (ref 65–99)
Glucose-Capillary: 288 mg/dL — ABNORMAL HIGH (ref 65–99)
Glucose-Capillary: 300 mg/dL — ABNORMAL HIGH (ref 65–99)

## 2015-04-28 LAB — TSH
TSH: 0.522 u[IU]/mL (ref 0.350–4.500)
TSH: 0.829 u[IU]/mL (ref 0.350–4.500)

## 2015-04-28 LAB — BASIC METABOLIC PANEL
ANION GAP: 9 (ref 5–15)
BUN: 8 mg/dL (ref 6–20)
CALCIUM: 8.8 mg/dL — AB (ref 8.9–10.3)
CO2: 28 mmol/L (ref 22–32)
Chloride: 101 mmol/L (ref 101–111)
Creatinine, Ser: 0.63 mg/dL (ref 0.44–1.00)
Glucose, Bld: 82 mg/dL (ref 65–99)
POTASSIUM: 3.4 mmol/L — AB (ref 3.5–5.1)
Sodium: 138 mmol/L (ref 135–145)

## 2015-04-28 LAB — MAGNESIUM: Magnesium: 1.8 mg/dL (ref 1.7–2.4)

## 2015-04-28 LAB — VITAMIN B12: VITAMIN B 12: 248 pg/mL (ref 180–914)

## 2015-04-28 LAB — CBG MONITORING, ED: Glucose-Capillary: 272 mg/dL — ABNORMAL HIGH (ref 65–99)

## 2015-04-28 LAB — AMMONIA
AMMONIA: 23 umol/L (ref 9–35)
AMMONIA: 35 umol/L (ref 9–35)

## 2015-04-28 LAB — HIV ANTIBODY (ROUTINE TESTING W REFLEX): HIV SCREEN 4TH GENERATION: NONREACTIVE

## 2015-04-28 LAB — SEDIMENTATION RATE: Sed Rate: 5 mm/hr (ref 0–22)

## 2015-04-28 LAB — RPR: RPR Ser Ql: NONREACTIVE

## 2015-04-28 LAB — T4, FREE: Free T4: 1.33 ng/dL — ABNORMAL HIGH (ref 0.61–1.12)

## 2015-04-28 MED ORDER — LORAZEPAM 2 MG/ML IJ SOLN: 1.0000 mg | INTRAMUSCULAR | Status: DC | PRN

## 2015-04-28 MED ORDER — DIVALPROEX SODIUM 500 MG PO DR TAB
500.0000 mg | DELAYED_RELEASE_TABLET | Freq: Two times a day (BID) | ORAL | Status: DC
  Administered 2015-04-29: 500 mg via ORAL
  Filled 2015-04-28: qty 1

## 2015-04-28 MED ORDER — ASPIRIN EC 325 MG PO TBEC
325.0000 mg | DELAYED_RELEASE_TABLET | Freq: Once | ORAL | Status: AC
  Administered 2015-04-28: 325 mg via ORAL
  Filled 2015-04-28: qty 1

## 2015-04-28 MED ORDER — DEXTROSE 5 % IV SOLN
1000.0000 mg | Freq: Once | INTRAVENOUS | Status: AC
  Administered 2015-04-28: 1000 mg via INTRAVENOUS
  Filled 2015-04-28: qty 10

## 2015-04-28 NOTE — Consult Note (Addendum)
Neurology Consultation Reason for Consult: confusion vs speech disturbance Referring Physician: OSH ED attending  CC: as above  History is obtained from: patient's significant other at bedside  HPI: Leslie Mccann is a 70 y.o. female with a psychiatric hx on abilify, as well as DMI.  This am patient was home and very anxious before going to her doctors appointment.  Apparently this is something that she expressed earlier to husband.  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  When she made it to the doctor she was very nervous at the office and seemed to not remember her date of birth and made some other similar errors which made the family and PMD concerned and the pt was then sent to the ED for evaluation.  Because of what was considered to be a speech disturbance due to possible stroke she was sent here for evaluation and management.  She has been somewhat hypertensive throughout the day today.  Lately she has been having very low sugars at night, and PMD has wanted to decrease the amount of evening lantus.  Other than this she has been at her USOH until this morning.     LKW: this morning tpa given?: no - unclear if she was evaluated as a possible stroke  ROS: A 14 point ROS was performed and is negative except as noted in the HPI  Past Medical History  Diagnosis Date  . Diabetes mellitus   . Depression   . Anxiety   . Retinopathy   . TIA (transient ischemic attack) 10/16/2013  . Diabetic retinopathy ([place]) 10/16/2013  . Snorings 10/16/2013  . 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 Asthma     childhood  . Tremors of nervous system     Family History  Problem Relation Age of Onset  . Cancer Mother   . COPD Father   . Diabetes Father   . Hypertension Maternal Grandmother   . Stroke Maternal Grandmother   . Stroke Maternal Grandfather   . Cancer Maternal Grandfather   . Hypothyroidism Daughter      Social History:  reports that she quit smoking about 17 years ago. Her smoking use included Cigarettes. She has a 10 pack-year smoking history. She has never used smokeless tobacco. She reports that she drinks alcohol. She reports that she does not use illicit drugs.  Exam: Current vital signs: BP 132/60 mmHg  Pulse 99  Temp(Src) 99.5 F (37.5 C) (Oral)  Resp 16  Ht 5' 5.5" (1.664 m)  Wt 58.06 kg (128 lb)  BMI 20.97 kg/m2  SpO2 99% Vital signs in last 24 hours: Temp:  [97.9 F (36.6 C)-99.5 F (37.5 C)] 99.5 F (37.5 C) (03/16 0201) Pulse Rate:  [86-103] 99 (03/16 0201) Resp:  [12-23] 16 (03/16 0201) BP: (132-180)/(60-89) 132/60 mmHg (03/16 0201) SpO2:  [96 %-100 %] 99 % (03/16 0201) Weight:  [58.06 kg (128 lb)] 58.06 kg (128 lb) (03/16 0201)   Physical Exam  Constitutional: Appears well-developed and well-nourished.  Psych: Affect appropriate to situation Eyes: No scleral injection HENT: No OP obstrucion Head: Normocephalic.  Cardiovascular: Normal rate and regular rhythm.  Respiratory: Effort normal and breath sounds normal to anterior ascultation GI: Soft.  No distension. There is no tenderness.  Skin: WDI  Neuro: Mental Status: Patient is awake, alert, oriented to person, place but is unable to come up with month or date.  She has a hard time identifying  how many fingers I am showing her. Patient is unable to give a clear and coherent history. Demonstrates some signs of possible expressive aphasia Cranial Nerves: II: Visual Fields are full. Pupils are equal, round, and reactive to light. III,IV, VI: EOMI without ptosis or diploplia.  V: Facial sensation is symmetric to temperature VII: Facial movement is symmetric.  VIII: hearing is intact to voice X: Uvula elevates symmetrically XI: Shoulder shrug is symmetric. XII: tongue is midline without atrophy or fasciculations.  Motor: Tone is normal. Bulk is normal. 5/5 strength was present in all four  extremities. Sensory: Sensation is symmetric to light touch and temperature in the arms and legs. Deep Tendon Reflexes: Unable to obtain Plantars: Toes are downgoing bilaterally. Cerebellar: FNF and HKS are intact bilaterally    I have reviewed labs in epic and the results pertinent to this consultation are: elevated glucose BMP and CBC normal  I have reviewed the images obtained:CT negative  Impression: Expressive aphasia vs encephalopathy but favor stroke as a cause of symptoms and agree with stroke admission.  Needs MRI/MRA lipid panel, echo and carotid doppler.  Stroke team to round on patient in the morning.  Will need lovenox for dvt prophylaxis.  Diabetic control.  Permissive HTN for now.  Speech therapy.  S/s eval--> feed.  Patient did not receive ASA today.  Will give first dose now.  If MRI negative for stroke consider EEG  Recommendations: 1) as above

## 2015-04-28 NOTE — Procedures (Signed)
History: Leslie Mccann is an 70 y.o. female patient with aphasia and altered mental status with negative brain MRI for acute pathology. Routine inpatient EEG was performed for further evaluation.   Patient Active Problem List   Diagnosis Date Noted  . Difficulty speaking 04/27/2015  . Insulin dependent diabetes mellitus ([place]) 04/27/2015  . Hyponatremia 04/27/2015  . Hypochloremia 04/27/2015  . Hypothyroidism 04/27/2015  . Anxiety 04/27/2015  . Acute encephalopathy 04/27/2015  . Speech abnormality   . Nocturnal hypoxemia 12/17/2013  . TIA (transient ischemic attack) 10/16/2013  . Diabetic retinopathy ([place]) 10/16/2013  . Transient alteration of awareness 10/16/2013  . DM retinopathy ([place]) 10/16/2013  . Snorings 10/16/2013  . 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  ARIPiprazole (ABILIFY) tablet 2 mg, 2 mg, Oral, QPM, Ilene Qua Opyd, MD, 2 mg at 04/28/15 0002 .  aspirin tablet 325 mg, 325 mg, Oral, Daily, Vianne Bulls, MD, 325 mg at 04/28/15 [id number] .  DULoxetine (CYMBALTA) DR capsule 40 mg, 40 mg, Oral, Daily, Ilene Qua Opyd, MD, 40 mg at 04/28/15 1437 .  enoxaparin (LOVENOX) injection 40 mg, 40 mg, Subcutaneous, Q24H, Ilene Qua Opyd, MD, 40 mg at 04/27/15 2145 .  insulin aspart (novoLOG) injection 0-15 Units, 0-15 Units, Subcutaneous, TID WC, Vianne Bulls, MD, 8 Units at 04/28/15 1642 .  insulin glargine (LANTUS) injection 8 Units, 8 Units, Subcutaneous, QHS, Vianne Bulls, MD, 8 Units at 04/28/15 0057 .  levothyroxine (SYNTHROID, LEVOTHROID) tablet 100 mcg, 100 mcg, Oral, QAC breakfast, Vianne Bulls, MD, 100 mcg at 04/28/15 0819 .  LORazepam (ATIVAN) tablet 1 mg, 1 mg, Oral, BID, Ilene Qua Opyd, MD, 1 mg at 04/28/15 0829 .  raloxifene (EVISTA) tablet 60 mg, 60 mg, Oral, QPM, Ilene Qua Opyd, MD, 60 mg at 04/28/15 1833 .  senna-docusate (Senokot-S) tablet 1 tablet, 1 tablet, Oral, QHS PRN, Ilene Qua Opyd, MD .  traMADol (ULTRAM) tablet 50 mg, 50 mg, Oral, Q6H PRN, Vianne Bulls, MD .  Vitamin D (Ergocalciferol) (DRISDOL) capsule 50,000 Units, 50,000 Units, Oral, Q7 days, Vianne Bulls, MD, 50,000 Units at 04/27/15 2358   Introduction:  This is a 19 channel routine scalp EEG performed at the bedside with bipolar and monopolar montages arranged in accordance to the international 10/20 system of electrode placement. One channel was dedicated to EKG recording.   Findings:  The background rhythm was normal 9-10 Hz alpha . Intermittent left frontal abnormal epileptiform discharges in the Form of Sharps with Phase Reversals Were Seen.  No definite evidence of  electrographic seizures were noted during this recording.   Impression:  Abnormal routine inpatient EEG suggestive of underlying neuronal dysfunction with epileptogenic potential in the left frontal region as described. No electrographic seizures were seen during this recording Clinical correlation is recommended .

## 2015-04-28 NOTE — Progress Notes (Addendum)
PROGRESS NOTE  Leslie Mccann [id number] DOB: [birth year] DOA: 04/27/2015 PCP: Sheela Stack, MD  HPI/Recap of past 24 hours:  Reported feeling better, still have some word finding troubles, but speech is a lot clear, husband in room  Assessment/Plan: Principal Problem:   Difficulty speaking Active Problems:   Nocturnal hypoxemia   Insulin dependent diabetes mellitus ([place])   Hyponatremia   Hypochloremia   Hypothyroidism   Anxiety   Acute encephalopathy  1. Acute encephalopathy vs CVA - Anomic aphasia, or word-finding difficulty is the dominant feature - Chronic hand tremor has worsened since this am, possibly explained by situational anxiety, but also noted to have dysmetria with finger-to-nose testing  - There is no appreciable focal weakness or numbness  - Non-con head CT without acute abnormality  - Hyperglycemic to 378 on arrival, but labs o/w largely unremarkable  - Neurology consulted from the ED at [facility] and recommends admission to [facility] for stroke w/u and possible EEG  - MRI no acute stroke, carotid dopplers unremarkable, TTE, A1c, lipid panel  - ASA for ppx  - Control sugars, correct lytes as below   2. Hyponatremia, hypochloremia  - Mild, anticipate resolution with gentle IVF hydration  - na normalized with hydration.     3. Insulin-dependent DM type II - Reports recent night time hypoglycemic episodes and had seen PCP today as they titrate her insulins  - Start with Lantus 8 units qHS with moderate-intensity sliding-scale Novolog correctional  - a1c pending, continue adjust insulin dose - Carb-modified diet  4. Hypothyroidism  - tsh 0.5  - Continue home-dose Synthroid   5. Anxiety, depression  - Chronic, acutely worse d/t current situation  - Continue home-dose Cymbalta and Abilify  - Continue home-dose Ativan prn  - Monitor   6. Nocturnal hypoxia, on home o2 at night.  DVT ppx: SQ Lovenox   Code Status: full  Family  Communication: patient  And husband in room,  3:45pm addendum: talked to family member 31 and Jenny Reichmann over the phone explained all the lab test values and available imaging results, explained to the family that mri no acute stroke, patient 's symptom could be multifactorial including fluctuating blood glucose and possible TIA. currently EEG and echocardiogram result is pending, likely able to discharge home on 3/17 if eeg and echo unremarkable, patient will benefit from outpatient PT.  Disposition Plan: likely home on 3/17   Consultants:  neurology  Procedures:  EEG  Antibiotics:  none   Objective: BP 119/54 mmHg  Pulse 83  Temp(Src) 97.9 F (36.6 C) (Oral)  Resp 16  Ht 5' 5.5" (1.664 m)  Wt 58.06 kg (128 lb)  BMI 20.97 kg/m2  SpO2 99% No intake or output data in the 24 hours ending 04/28/15 0933 Filed Weights   04/27/15 1648 04/28/15 0201  Weight: 58.06 kg (128 lb) 58.06 kg (128 lb)    Exam:   General:  NAD, thin (weight 58kg)  Cardiovascular: RRR  Respiratory: CTABL  Abdomen: Soft/ND/NT, positive BS  Musculoskeletal: No Edema  Neuro: aaox3, speech slow, but clear, no focal deficit appreciated, no resting tremor, + finger to nose ataxia  Data Reviewed: Basic Metabolic Panel:  Recent Labs Lab 04/27/15 1717 04/27/15 1724  NA 134* 134*  K 4.2 4.2  CL 96* 95*  CO2 29  --   GLUCOSE 378* 367*  BUN 15 15  CREATININE 0.71 0.60  CALCIUM 8.8*  --    Liver Function Tests:  Recent Labs Lab 04/27/15 1717  AST 28  ALT 28  ALKPHOS 79  BILITOT 0.5  PROT 6.7  ALBUMIN 3.8   No results for input(s): LIPASE, AMYLASE in the last 168 hours.  Recent Labs Lab 04/27/15 2321  AMMONIA 23   CBC:  Recent Labs Lab 04/27/15 1717 04/27/15 1724  WBC 5.8  --   NEUTROABS 3.8  --   HGB 12.7 14.6  HCT 38.4 43.0  MCV 100.8*  --   PLT 269  --    Cardiac Enzymes:   No results for input(s): CKTOTAL, CKMB, CKMBINDEX, TROPONINI in the last 168 hours. BNP  (last 3 results) No results for input(s): BNP in the last 8760 hours.  ProBNP (last 3 results) No results for input(s): PROBNP in the last 8760 hours.  CBG:  Recent Labs Lab 04/27/15 1701 04/27/15 2121 04/27/15 2320 04/28/15 0054 04/28/15 0701  GLUCAP 331* 116* 210* 272* 214*    No results found for this or any previous visit (from the past 240 hour(s)).   Studies: Ct Head Wo Contrast  04/27/2015  CLINICAL DATA:  Confusion and difficulty speaking EXAM: CT HEAD WITHOUT CONTRAST TECHNIQUE: Contiguous axial images were obtained from the base of the skull through the vertex without intravenous contrast. COMPARISON:  10/05/2013 FINDINGS: Bony calvarium is intact. Mild atrophic changes are seen. No findings to suggest acute hemorrhage, acute infarction or space-occupying mass lesion are seen. A a small lacunar infarct is noted in the anterior aspect of the thalamus on the right. IMPRESSION: Chronic atrophic and ischemic changes without acute abnormality. Electronically Signed   By: Inez Catalina M.D.   On: 04/27/2015 17:16    Scheduled Meds: . ARIPiprazole  2 mg Oral QPM  . aspirin  325 mg Oral Daily  . DULoxetine  40 mg Oral Daily  . enoxaparin (LOVENOX) injection  40 mg Subcutaneous Q24H  . insulin aspart  0-15 Units Subcutaneous TID WC  . insulin glargine  8 Units Subcutaneous QHS  . levothyroxine  100 mcg Oral QAC breakfast  . LORazepam  1 mg Oral BID  . raloxifene  60 mg Oral QPM  . Vitamin D (Ergocalciferol)  50,000 Units Oral Q7 days    Continuous Infusions:    Time spent: 32mins  Anmarie Fukushima MD, PhD  [facility] Pager [phone]. If 7PM-7AM, please contact night-coverage at [email or web address], password [facility] 04/28/2015, 9:33 AM

## 2015-04-28 NOTE — Evaluation (Signed)
Physical Therapy Evaluation Patient Details Name: Leslie Mccann MRN: [id number] DOB: 1945-12-24 Today's Date: 04/28/2015   History of Present Illness  Pt admitted with confusion, difficulty speaking from MD office, MRI negative. PMhx: DM, psych history, anxiety, tremors  Clinical Impression  Pt very pleasant, moving well, continues to have difficulty with problem solving but oriented today. Pt with balance deficits noted which she states have been progressively worse at baseline. Pt with decreased cognition and balance who will benefit from acute therapy to maximize mobility and independence to decrease fall risk and burden of care.     Follow Up Recommendations Outpatient PT    Equipment Recommendations  None recommended by PT    Recommendations for Other Services       Precautions / Restrictions Precautions Precautions: Fall      Mobility  Bed Mobility Overal bed mobility: Modified Independent                Transfers Overall transfer level: Modified independent                  Ambulation/Gait Ambulation/Gait assistance: Supervision Ambulation Distance (Feet): 350 Feet Assistive device: None Gait Pattern/deviations: WFL(Within Functional Limits)   Gait velocity interpretation: at or above normal speed for age/gender General Gait Details: supervision for cognition and directional cues  Stairs Stairs: Yes Stairs assistance: Modified independent (Device/Increase time) Stair Management: One rail Right;Alternating pattern;Forwards Number of Stairs: 3    Wheelchair Mobility    Modified Rankin (Stroke Patients Only)       Balance Overall balance assessment: Needs assistance   Sitting balance-Leahy Scale: Normal       Standing balance-Leahy Scale: Good Standing balance comment: pt with LOB with all narrowed BOS, eyes closed or challenges. pt states she has noted some decreasing balance prior to admission Single Leg Stance - Right Leg:  1 Single Leg Stance - Left Leg: 1 Tandem Stance - Right Leg: 0 Tandem Stance - Left Leg: 0   Rhomberg - Eyes Closed: 3                 Pertinent Vitals/Pain Pain Assessment: No/denies pain    Home Living Family/patient expects to be discharged to:: Private residence Living Arrangements: Spouse/significant other Available Help at Discharge: Family Type of Home: House Home Access: Stairs to enter   Technical brewer of Steps: 3 Home Layout: One level Home Equipment: None      Prior Function Level of Independence: Independent               Hand Dominance        Extremity/Trunk Assessment   Upper Extremity Assessment: Overall WFL for tasks assessed           Lower Extremity Assessment: Overall WFL for tasks assessed      Cervical / Trunk Assessment: Normal  Communication   Communication: No difficulties  Cognition Arousal/Alertness: Awake/alert Behavior During Therapy: WFL for tasks assessed/performed Overall Cognitive Status: Impaired/Different from baseline Area of Impairment: Problem solving               General Comments: pt with difficulty finding room number, oriented to time, place and situation    General Comments      Exercises        Assessment/Plan    PT Assessment Patient needs continued PT services  PT Diagnosis Altered mental status   PT Problem List Decreased balance;Decreased activity tolerance;Decreased cognition  PT Treatment Interventions Gait training;Balance training;Therapeutic activities;Functional mobility training;Patient/family education  PT Goals (Current goals can be found in the Care Plan section) Acute Rehab PT Goals Patient Stated Goal: return home PT Goal Formulation: With patient/family Time For Goal Achievement: 05/05/15 Potential to Achieve Goals: Good    Frequency Min 3X/week   Barriers to discharge        Co-evaluation               End of Session   Activity Tolerance:  Patient tolerated treatment well Patient left: in chair;with call bell/phone within reach;with family/visitor present Nurse Communication: Mobility status         Time: [id number] PT Time Calculation (min) (ACUTE ONLY): 17 min   Charges:   PT Evaluation $PT Eval Moderate Complexity: 1 Procedure     PT G CodesMelford Aase 04/28/2015, 1:31 PM  Elwyn Reach, [place]

## 2015-04-28 NOTE — Progress Notes (Signed)
Inpatient Diabetes Program Recommendations  AACE/ADA: New Consensus Statement on Inpatient Glycemic Control (2015)  Target Ranges:  Prepandial:   less than 140 mg/dL      Peak postprandial:   less than 180 mg/dL (1-2 hours)      Critically ill patients:  140 - 180 mg/dL   Results for Leslie Mccann, Leslie Mccann (MRN [id number]) as of 04/28/2015 07:48  Ref. Range 04/27/2015 17:01 04/27/2015 21:21 04/27/2015 23:20 04/28/2015 00:54 04/28/2015 07:01  Glucose-Capillary Latest Ref Range: 65-99 mg/dL 331 (H) 116 (H) 210 (H) 272 (H) 214 (H)   Review of Glycemic Control  Diabetes history: DM 2 Outpatient Diabetes medications: Lantus 12 units, Humalog 4-8 units TID with meals Current orders for Inpatient glycemic control: Lantus 8 units QHS, Novolog Moderate TID  Inpatient Diabetes Program Recommendations: Insulin - Basal: Fasting glucose this am was 214 mg/dl. Patient takes Lantus 12 units at home. Please consider increasing basal insulin to home dose Lantus 12 units Q24hrs.   Thanks,  Tama Headings RN, MSN, [facility] Inpatient Diabetes Coordinator Team Pager [phone] (8a-5p)

## 2015-04-28 NOTE — Progress Notes (Signed)
Occupational Therapy Evaluation/Discharge Patient Details Name: Leslie Mccann MRN: [id number] DOB: Jun 09, 1945 Today's Date: 04/28/2015    History of Present Illness Pt admitted with confusion, difficulty speaking from MD office, MRI negative. PMhx: DM, psych history, anxiety, tremors   Clinical Impression   Patient has been evaluated by Occupational Therapy with no acute OT needs identified. Pt completed all ADLs and transfers at mod I level. Pt demonstrated some mild bilateral UE weakness and reported pain in both hands and shoulders with activity. Provided pt with list of functional activities to complete to alleviate pain and strength hands. All education has been completed and pt has no further questions. OT signing off. Thank you for this referral.    Follow Up Recommendations  No OT follow up    Equipment Recommendations  None recommended by OT    Recommendations for Other Services       Precautions / Restrictions Precautions Precautions: Fall Restrictions Weight Bearing Restrictions: No      Mobility Bed Mobility Overal bed mobility: Modified Independent                Transfers Overall transfer level: Modified independent                    Balance Overall balance assessment: Needs assistance   Sitting balance-Leahy Scale: Normal       Standing balance-Leahy Scale: Good Standing balance comment: pt with LOB with all narrowed BOS, eyes closed or challenges. pt states she has noted some decreasing balance prior to admission Single Leg Stance - Right Leg: 1 Single Leg Stance - Left Leg: 1 Tandem Stance - Right Leg: 0 Tandem Stance - Left Leg: 0   Rhomberg - Eyes Closed: 3                ADL Overall ADL's : Modified independent                                       General ADL Comments: Able to complete ADLs in room and ambulate without AD independently. Minor balance impairments that pt and pt's hsuband report are  her baseline for mobility. Provided pt with functional daily activites that she can perform at home to alleviate pain and strengthen hands.     Vision Vision Assessment?: Yes;No apparent visual deficits Eye Alignment: Within Functional Limits Ocular Range of Motion: Within Functional Limits Alignment/Gaze Preference: Within Defined Limits Tracking/Visual Pursuits: Able to track stimulus in all quads without difficulty Saccades: Within functional limits Convergence: Within functional limits Visual Fields: No apparent deficits   Perception     Praxis      Pertinent Vitals/Pain Pain Assessment: No/denies pain     Hand Dominance Right   Extremity/Trunk Assessment Upper Extremity Assessment Upper Extremity Assessment: Overall WFL for tasks assessed (grossly 4/5, reports pain in bilateral hands and shoulders)   Lower Extremity Assessment Lower Extremity Assessment: Overall WFL for tasks assessed   Cervical / Trunk Assessment Cervical / Trunk Assessment: Normal   Communication Communication Communication: No difficulties   Cognition Arousal/Alertness: Awake/alert Behavior During Therapy: WFL for tasks assessed/performed Overall Cognitive Status: Within Functional Limits for tasks assessed                     General Comments       Exercises       Shoulder Instructions  Home Living Family/patient expects to be discharged to:: Private residence Living Arrangements: Spouse/significant other Available Help at Discharge: Family;Available 24 hours/day Type of Home: House Home Access: Stairs to enter [employer] of Steps: 3   Home Layout: One level         Bathroom Toilet: Standard     Home Equipment: None          Prior Functioning/Environment Level of Independence: Independent             OT Diagnosis: Acute pain   OT Problem List: Impaired balance (sitting and/or standing);Decreased safety awareness   OT  Treatment/Interventions:      OT Goals(Current goals can be found in the care plan section) Acute Rehab OT Goals Patient Stated Goal: return home OT Goal Formulation: With patient Time For Goal Achievement: 05/12/15 Potential to Achieve Goals: Good  OT Frequency:     Barriers to D/C:            Co-evaluation              End of Session Nurse Communication: Mobility status  Activity Tolerance: Patient tolerated treatment well Patient left: in bed;with call bell/phone within reach;with family/visitor present   Time: [id number] OT Time Calculation (min): 20 min Charges:  OT General Charges $OT Visit: 1 Procedure OT Evaluation $OT Eval Low Complexity: 1 Procedure G-Codes: OT G-codes **NOT FOR INPATIENT CLASS** Functional Assessment Tool Used: clinical judgement Functional Limitation: Self care Self Care Current Status [id number]): 0 percent impaired, limited or restricted Self Care Goal Status [id number]): 0 percent impaired, limited or restricted Self Care Discharge Status [id number]): 0 percent impaired, limited or restricted  Redmond Baseman, OTR/L Pager: [phone] 04/28/2015, 5:26 PM

## 2015-04-28 NOTE — Progress Notes (Signed)
Preliminary results by tech - Carotid Duplex Completed. No evidence of a significant stenosis in the right or left carotid arteries. Vertebral arteries demonstrate antegrade flow. Oda Cogan, BS, RDMS, RVT

## 2015-04-28 NOTE — Progress Notes (Signed)
Pt arrived on unit approx 0145 hrs, A&O, noted expressive aphasia but no other complaints at that time, notified on call MD of Pt arrival , implemented admission orders, and oriented pt to room and equipment.

## 2015-04-28 NOTE — Care Management Obs Status (Signed)
[facility] NOTIFICATION   Patient Details  Name: Leslie Mccann MRN: [id number] Date of Birth: 1946/02/06   Medicare Observation Status Notification Given:  Yes (MRI negative)    Rolm Baptise, RN 04/28/2015, 4:22 PM

## 2015-04-28 NOTE — Consult Note (Signed)
Interval History:                                                                                                                      Leslie Mccann is an 70 y.o. female patient with  psychiatric hx on abilify, as well as DMI. Yesterday am patient was home and very anxious before going to her doctors appointment. Apparently this is something that she expressed earlier to husband. 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 When she made it to the doctor she was very nervous at the office and seemed to not remember her date of birth and made some other similar errors which made the family and PMD concerned and the pt was then sent to the ED for evaluation. Today she is able to repeat, name, express and understand all what is said to her. She is confused and husband states she has baseline and worsening confusion over time.    Past Medical History: Past Medical History  Diagnosis Date  . Diabetes mellitus   . Depression   . Anxiety   . Retinopathy   . TIA (transient ischemic attack) 10/16/2013  . Diabetic retinopathy ([place]) 10/16/2013  . Snorings 10/16/2013  . 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 Asthma     childhood  . Tremors of nervous system     Past Surgical History  Procedure Laterality Date  . Eye surgeries     . Cataract extraction, bilateral    . Colonoscopy      Family History: Family History  Problem Relation Age of Onset  . Cancer Mother   . COPD Father   . Diabetes Father   . Hypertension Maternal Grandmother   . Stroke Maternal Grandmother   . Stroke Maternal Grandfather   . Cancer Maternal Grandfather   . Hypothyroidism Daughter     Social History:   reports that she quit smoking about 17 years ago. Her smoking use included Cigarettes. She has a 10 pack-year smoking history. She has never used smokeless tobacco. She reports that she drinks alcohol. She reports that she does not use  illicit drugs.  Allergies:  Allergies  Allergen Reactions  . Codeine Nausea And Vomiting  . Sulfa Antibiotics Nausea And Vomiting  . Adhesive [Tape] Rash     Medications:  Current facility-administered medications:  .  ARIPiprazole (ABILIFY) tablet 2 mg, 2 mg, Oral, QPM, Ilene Qua Opyd, MD, 2 mg at 04/28/15 0002 .  aspirin tablet 325 mg, 325 mg, Oral, Daily, Vianne Bulls, MD, 325 mg at 04/28/15 [id number] .  DULoxetine (CYMBALTA) DR capsule 40 mg, 40 mg, Oral, Daily, Timothy S Opyd, MD .  enoxaparin (LOVENOX) injection 40 mg, 40 mg, Subcutaneous, Q24H, Ilene Qua Opyd, MD, 40 mg at 04/27/15 2145 .  insulin aspart (novoLOG) injection 0-15 Units, 0-15 Units, Subcutaneous, TID WC, Vianne Bulls, MD, 5 Units at 04/28/15 0736 .  insulin glargine (LANTUS) injection 8 Units, 8 Units, Subcutaneous, QHS, Vianne Bulls, MD, 8 Units at 04/28/15 0057 .  levothyroxine (SYNTHROID, LEVOTHROID) tablet 100 mcg, 100 mcg, Oral, QAC breakfast, Vianne Bulls, MD, 100 mcg at 04/28/15 0819 .  LORazepam (ATIVAN) tablet 1 mg, 1 mg, Oral, BID, Ilene Qua Opyd, MD, 1 mg at 04/28/15 0829 .  raloxifene (EVISTA) tablet 60 mg, 60 mg, Oral, QPM, Ilene Qua Opyd, MD, 60 mg at 04/28/15 0003 .  senna-docusate (Senokot-S) tablet 1 tablet, 1 tablet, Oral, QHS PRN, Ilene Qua Opyd, MD .  traMADol (ULTRAM) tablet 50 mg, 50 mg, Oral, Q6H PRN, Vianne Bulls, MD .  Vitamin D (Ergocalciferol) (DRISDOL) capsule 50,000 Units, 50,000 Units, Oral, Q7 days, Vianne Bulls, MD, 50,000 Units at 04/27/15 2358   Neurologic Examination:                                                                                                     Today's Vitals   04/28/15 0201 04/28/15 0400 04/28/15 0600 04/28/15 0923  BP: 132/60 122/48 112/56 119/54  Pulse: 99 95 87 83  Temp: 99.5 F (37.5 C) 99.2 F (37.3 C) 98.3 F (36.8 C)  97.9 F (36.6 C)  TempSrc: Oral Oral Oral Oral  Resp: 16 16 16 16   Height: 5' 5.5" (1.664 m)     Weight: 58.06 kg (128 lb)     SpO2: 99% 100% 99% 99%    Evaluation of higher integrative functions including: Level of alertness: Alert,  Oriented to time, place and person but not year Speech: fluent, no evidence of dysarthria or aphasia noted.  Test the following cranial nerves: 2-12 grossly intact Motor examination: Normal tone, bulk, full 5/5 motor strength in all 4 extremities Examination of sensation : Normal and symmetric sensation to pinprick in all 4 extremities and on face Examination of deep tendon reflexes: no DTR   Lab Results: Basic Metabolic Panel:  Recent Labs Lab 04/27/15 1717 04/27/15 1724 04/28/15 0945  NA 134* 134* 138  K 4.2 4.2 3.4*  CL 96* 95* 101  CO2 29  --  28  GLUCOSE 378* 367* 82  BUN 15 15 8   CREATININE 0.71 0.60 0.63  CALCIUM 8.8*  --  8.8*  MG  --   --  1.8    Liver Function Tests:  Recent Labs Lab 04/27/15 1717  AST 28  ALT 28  ALKPHOS 79  BILITOT 0.5  PROT 6.7  ALBUMIN 3.8  No results for input(s): LIPASE, AMYLASE in the last 168 hours.  Recent Labs Lab 04/27/15 2321  AMMONIA 23    CBC:  Recent Labs Lab 04/27/15 1717 04/27/15 1724  WBC 5.8  --   NEUTROABS 3.8  --   HGB 12.7 14.6  HCT 38.4 43.0  MCV 100.8*  --   PLT 269  --     Cardiac Enzymes: No results for input(s): CKTOTAL, CKMB, CKMBINDEX, TROPONINI in the last 168 hours.  Lipid Panel: No results for input(s): CHOL, TRIG, HDL, CHOLHDL, VLDL, LDLCALC in the last 168 hours.  CBG:  Recent Labs Lab 04/27/15 2121 04/27/15 2320 04/28/15 0054 04/28/15 0133 04/28/15 0701  GLUCAP 116* 210* 272* 258* 214*    Microbiology: No results found for this or any previous visit.  Imaging: Ct Head Wo Contrast  04/27/2015  CLINICAL DATA:  Confusion and difficulty speaking EXAM: CT HEAD WITHOUT CONTRAST TECHNIQUE: Contiguous axial images were obtained from the  base of the skull through the vertex without intravenous contrast. COMPARISON:  10/05/2013 FINDINGS: Bony calvarium is intact. Mild atrophic changes are seen. No findings to suggest acute hemorrhage, acute infarction or space-occupying mass lesion are seen. A a small lacunar infarct is noted in the anterior aspect of the thalamus on the right. IMPRESSION: Chronic atrophic and ischemic changes without acute abnormality. Electronically Signed   By: Inez Catalina M.D.   On: 04/27/2015 17:16   Mr Brain Wo Contrast  04/28/2015  CLINICAL DATA:  70 year old female with abnormal speech and increased chronic tremor. Confusion. Initial encounter. EXAM: MRI HEAD WITHOUT CONTRAST MRA HEAD WITHOUT CONTRAST TECHNIQUE: Multiplanar, multiecho pulse sequences of the brain and surrounding structures were obtained without intravenous contrast. Angiographic images of the head were obtained using MRA technique without contrast. COMPARISON:  Head CT without contrast 04/27/2015. Brain MRI 10/05/2013. FINDINGS: MRI HEAD FINDINGS Major intracranial vascular flow voids are stable. Cerebral volume is not significantly changed since 2015. Mildly noisy diffusion-weighted imaging, but no convincing restricted diffusion to suggest acute infarct. No midline shift, mass effect, evidence of mass lesion, ventriculomegaly, extra-axial collection or acute intracranial hemorrhage. Cervicomedullary junction and pituitary are within normal limits. 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 Otherwise gray and white matter signal is stable and normal for age. No cortical encephalomalacia. No chronic cerebral blood products. Brainstem and cerebellum are within normal limits. Visible internal auditory structures appear normal. Trace bilateral mastoid fluid is stable. Negative nasopharynx. Stable trace paranasal sinus mucosal thickening. Stable orbits soft tissues. Negative scalp soft tissues. Mild chronic disc and  endplate degeneration in the visible cervical spine at C4-C5. Normal bone marrow signal. MRA HEAD FINDINGS Antegrade flow in the posterior circulation with codominant distal vertebral Arteries. Normal PICA origins. No distal vertebral artery or basilar stenosis. Normal SCA and left PCA origin. Fetal type right PCA origin, the right P1 segment appears irregular and stenotic (series 808, image 14). Bilateral PCA branches are within normal limits. Left posterior communicating artery is present. Antegrade flow in both ICA siphons. No siphon stenosis. Normal ophthalmic and posterior communicating artery origins. Patent carotid termini. Normal MCA and ACA origins anterior communicating artery and visualized bilateral ACA branches are within normal limits. MCA M1 segments and MCA bifurcations are within normal limits. Visualized bilateral MCA branches are within normal limits (infundibula at the right anterior temporal and lenticulostriate artery origins). IMPRESSION: 1.  No acute intracranial abnormality. 2. 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 3. Negative intracranial MRA aside from atherosclerotic and stenotic right PCA P1 segment (right thalamostriate artery region). Fortunately, there is a fetal type right PCA origin such that the more distal right PCA branches remain normal. Electronically Signed   By: Genevie Ann M.D.   On: 04/28/2015 11:39   Mr Jodene Nam Head/brain Wo Cm  04/28/2015  CLINICAL DATA:  70 year old female with abnormal speech and increased chronic tremor. Confusion. Initial encounter. EXAM: MRI HEAD WITHOUT CONTRAST MRA HEAD WITHOUT CONTRAST TECHNIQUE: Multiplanar, multiecho pulse sequences of the brain and surrounding structures were obtained without intravenous contrast. Angiographic images of the head were obtained using MRA technique without contrast. COMPARISON:  Head CT without contrast 04/27/2015. Brain MRI 10/05/2013. FINDINGS: MRI HEAD FINDINGS  Major intracranial vascular flow voids are stable. Cerebral volume is not significantly changed since 2015. Mildly noisy diffusion-weighted imaging, but no convincing restricted diffusion to suggest acute infarct. No midline shift, mass effect, evidence of mass lesion, ventriculomegaly, extra-axial collection or acute intracranial hemorrhage. Cervicomedullary junction and pituitary are within normal limits. 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 Otherwise gray and white matter signal is stable and normal for age. No cortical encephalomalacia. No chronic cerebral blood products. Brainstem and cerebellum are within normal limits. Visible internal auditory structures appear normal. Trace bilateral mastoid fluid is stable. Negative nasopharynx. Stable trace paranasal sinus mucosal thickening. Stable orbits soft tissues. Negative scalp soft tissues. Mild chronic disc and endplate degeneration in the visible cervical spine at C4-C5. Normal bone marrow signal. MRA HEAD FINDINGS Antegrade flow in the posterior circulation with codominant distal vertebral Arteries. Normal PICA origins. No distal vertebral artery or basilar stenosis. Normal SCA and left PCA origin. Fetal type right PCA origin, the right P1 segment appears irregular and stenotic (series 808, image 14). Bilateral PCA branches are within normal limits. Left posterior communicating artery is present. Antegrade flow in both ICA siphons. No siphon stenosis. Normal ophthalmic and posterior communicating artery origins. Patent carotid termini. Normal MCA and ACA origins anterior communicating artery and visualized bilateral ACA branches are within normal limits. MCA M1 segments and MCA bifurcations are within normal limits. Visualized bilateral MCA branches are within normal limits (infundibula at the right anterior temporal and lenticulostriate artery origins). IMPRESSION: 1.  No acute intracranial abnormality. 2. Stable  chronic small vessel disease in the thalami since 2015, worse on the right (see # 3). 3. Negative intracranial MRA aside from atherosclerotic and stenotic right PCA P1 segment (right thalamostriate artery region). Fortunately, there is a fetal type right PCA origin such that the more distal right PCA branches remain normal. Electronically Signed   By: Genevie Ann M.D.   On: 04/28/2015 11:39    Assessment and plan:   TERISA WILLING is an 70 y.o. female patient with confusion and sent to ED to rule out encephalopathy versus stroke. MRI brain showed no acute stroke. She remains slightly confused and husband states she has worsening memory at baseline. She is able to name, repeat, express herself and follow commands. UA showed mild leukocytosis and culture pending. This likely represents Metabolic/toxic encephalopathy in setting of memory decline. Will obtain a EEG to rule out seizure. If EEG is negative no further neurologic work up recommended. Would evaluate for other metabolic issues--TSH, Ammonia and UTI. As out patient she should follow up with neurology for declining memory.   Seen with Dr. Silverio Decamp. Please see his attestation note for A/P for  any additional work up recommendations.

## 2015-04-28 NOTE — Care Management Note (Signed)
Case Management Note  Patient Details  Name: Leslie Mccann MRN: [id number] Date of Birth: Feb 12, 1946  Subjective/Objective:                    Action/Plan: Patient presented with speech disturbance. Lives at home with spouse. Will follow for discharge needs pending PT/OT/SLP evals and physician orders.  Expected Discharge Date:   (unknown)               Expected Discharge Plan:     In-House Referral:     Discharge planning Services     Post Acute Care Choice:    Choice offered to:     DME Arranged:    DME Agency:     HH Arranged:    HH Agency:     Status of Service:  In process, will continue to follow  Medicare Important Message Given:    Date Medicare IM Given:    Medicare IM give by:    Date Additional Medicare IM Given:    Additional Medicare Important Message give by:     If discussed at [place] of Stay Meetings, dates discussed:    Additional CommentsRolm Baptise, RN 04/28/2015, 2:06 PM [phone]

## 2015-04-28 NOTE — Progress Notes (Signed)
EEG Completed; Results Pending  

## 2015-04-29 ENCOUNTER — Observation Stay (HOSPITAL_BASED_OUTPATIENT_CLINIC_OR_DEPARTMENT_OTHER): Payer: Medicare Other

## 2015-04-29 DIAGNOSIS — I6789 Other cerebrovascular disease: Secondary | ICD-10-CM | POA: Diagnosis not present

## 2015-04-29 DIAGNOSIS — R569 Unspecified convulsions: Secondary | ICD-10-CM | POA: Diagnosis not present

## 2015-04-29 DIAGNOSIS — R0902 Hypoxemia: Secondary | ICD-10-CM | POA: Diagnosis not present

## 2015-04-29 DIAGNOSIS — R490 Dysphonia: Secondary | ICD-10-CM | POA: Diagnosis not present

## 2015-04-29 LAB — BASIC METABOLIC PANEL
Anion gap: 7 (ref 5–15)
BUN: 13 mg/dL (ref 6–20)
CALCIUM: 8.6 mg/dL — AB (ref 8.9–10.3)
CO2: 31 mmol/L (ref 22–32)
CREATININE: 0.67 mg/dL (ref 0.44–1.00)
Chloride: 103 mmol/L (ref 101–111)
Glucose, Bld: 163 mg/dL — ABNORMAL HIGH (ref 65–99)
Potassium: 4.1 mmol/L (ref 3.5–5.1)
SODIUM: 141 mmol/L (ref 135–145)

## 2015-04-29 LAB — FOLATE RBC
FOLATE, HEMOLYSATE: 329.7 ng/mL
Folate, RBC: 891 ng/mL (ref >498)
Hematocrit: 37 % (ref 34.0–46.6)

## 2015-04-29 LAB — URINE CULTURE

## 2015-04-29 LAB — LIPID PANEL
CHOLESTEROL: 239 mg/dL — AB (ref 0–200)
HDL: 92 mg/dL (ref >40)
LDL Cholesterol: 138 mg/dL — ABNORMAL HIGH (ref 0–99)
TRIGLYCERIDES: 47 mg/dL (ref <150)
Total CHOL/HDL Ratio: 2.6 RATIO
VLDL: 9 mg/dL (ref 0–40)

## 2015-04-29 LAB — GLUCOSE, CAPILLARY
Glucose-Capillary: 168 mg/dL — ABNORMAL HIGH (ref 65–99)
Glucose-Capillary: 160 mg/dL — ABNORMAL HIGH (ref 65–99)

## 2015-04-29 LAB — ECHOCARDIOGRAM COMPLETE
HEIGHTINCHES: 65.5 in
WEIGHTICAEL: 2048 [oz_av]

## 2015-04-29 MED ORDER — DIVALPROEX SODIUM 500 MG PO DR TAB: 500.0000 mg | DELAYED_RELEASE_TABLET | Freq: Two times a day (BID) | ORAL | Status: DC

## 2015-04-29 MED ORDER — INSULIN GLARGINE 100 UNIT/ML ~~LOC~~ SOLN: 8.0000 [IU] | Freq: Every day | SUBCUTANEOUS | Status: DC

## 2015-04-29 MED ORDER — ASPIRIN 325 MG PO TABS: 325.0000 mg | ORAL_TABLET | Freq: Every day | ORAL | Status: DC

## 2015-04-29 NOTE — Progress Notes (Signed)
Discharge orders received. Pt and spouse educated on discharge instructions and seizure precautions. Pt verbalized understanding. IV and tele removed. Pt dressed and belongings packed. Pt taken downstairs by staff via wheelchair.

## 2015-04-29 NOTE — Discharge Summary (Signed)
Physician Discharge Summary  Leslie Mccann [id number] DOB: 12/13/1945 DOA: 04/27/2015  PCP: Sheela Stack, MD  Admit date: 04/27/2015 Discharge date: 04/29/2015  Time spent: > 35 minutes  Recommendations for Outpatient Follow-up:  1. Please ensure patient follows up with Neurologist after discharge. 2. Monitor blood sugars and adjust hypoglycemic agents accordingly   Discharge Diagnoses:  Principal Problem:   Difficulty speaking Active Problems:   Nocturnal hypoxemia   Insulin dependent diabetes mellitus (HCC)   Hyponatremia   Hypochloremia   Hypothyroidism   Anxiety   Acute encephalopathy   Seizures ([place])   Discharge Condition: stable  Diet recommendation:   Filed Weights   04/27/15 1648 04/28/15 0201  Weight: 58.06 kg (128 lb) 58.06 kg (128 lb)    History of present illness:  70 y.o. female patient with confusion and sent to ED to rule out encephalopathy versus stroke. MRI brain showed no acute stroke. She remains slightly confused and husband states she has worsening memory at baseline. She is able to name, repeat, express herself and follow commands. UA showed mild leukocytosis and culture pending. Due to abnormal EEG she needs to be on an antiepileptic medication.  Hospital Course:  Seizure  - Neurology consulted who recommended the following:  Given her underlying psychiatric issues, and more surprising agent such as Depakote would be a good choice as an antiepileptic medication for her. She has been started on Depakote and will need follow-up with outpatient neurology for continued monitoring of Depakote levels, liver function tests, platelets, and further dose titration based on her clinical symptoms and to monitor for side effects - Pt to f/u with Neurologist as outpatient for further evaluation and recommendations  For known medical conditions listed above will continue home medication regimen listed below.  Procedures:  EEG  MRI brain  Carotid  doppler  Echo  Consultations:  Neuro  Discharge Exam: Filed Vitals:   04/29/15 0939 04/29/15 1352  BP: 117/65 120/60  Pulse: 77 76  Temp: 98.9 F (37.2 C) 98.2 F (36.8 C)  Resp: 18 18    General:Patient in no acute distress, alert and awake Cardiovascular: Regular rate and rhythm, no rubs Respiratory: No increased work of breathing, no wheezes  Discharge Instructions   Discharge Instructions    Ambulatory referral to Physical Therapy    Complete by:  As directed      Call MD for:  difficulty breathing, headache or visual disturbances    Complete by:  As directed      Call MD for:  severe uncontrolled pain    Complete by:  As directed      Call MD for:  temperature >100.4    Complete by:  As directed      Diet - low sodium heart healthy    Complete by:  As directed      Discharge instructions    Complete by:  As directed   Please follow up with your primary care physician and neurologist after discharge.     Increase activity slowly    Complete by:  As directed           Current Discharge Medication List    START taking these medications   Details  aspirin 325 MG tablet Take 1 tablet (325 mg total) by mouth daily. Qty: 30 tablet, Refills: 0    divalproex (DEPAKOTE) 500 MG DR tablet Take 1 tablet (500 mg total) by mouth every 12 (twelve) hours. Qty: 60 tablet, Refills: 0  CONTINUE these medications which have CHANGED   Details  insulin glargine (LANTUS) 100 UNIT/ML injection Inject 0.08 mLs (8 Units total) into the skin at bedtime. Qty: 10 mL, Refills: 0      CONTINUE these medications which have NOT CHANGED   Details  ARIPiprazole (ABILIFY) 2 MG tablet Take 2 mg by mouth every evening.     DULoxetine (CYMBALTA) 20 MG capsule Take 40 mg by mouth daily.     insulin lispro (HUMALOG) 100 UNIT/ML injection Inject 4-8 Units into the skin 3 (three) times daily before meals.    levothyroxine (SYNTHROID, LEVOTHROID) 100 MCG tablet Take 100 mcg by mouth  every morning.     LORazepam (ATIVAN) 1 MG tablet Take 1 mg by mouth 2 (two) times daily.     raloxifene (EVISTA) 60 MG tablet Take 60 mg by mouth every evening.     !! Misc. Devices (FINGERTIP PULSE OXIMETER) MISC Use as directed Qty: 1 each, Refills: 0    !! Misc. Devices (PULSE OXIMETER) MISC 1 Device by Does not apply route as directed. Qty: 1 each, Refills: 0    ONE TOUCH ULTRA TEST test strip Refills: 0    traMADol (ULTRAM) 50 MG tablet Take 50 mg by mouth every 6 (six) hours as needed for moderate pain or severe pain. As needed    Vitamin D, Ergocalciferol, (DRISDOL) 50000 UNITS CAPS capsule Take 50,000 Units by mouth every 7 (seven) days.  Refills: 0     !! - Potential duplicate medications found. Please discuss with provider.     Allergies  Allergen Reactions  . Codeine Nausea And Vomiting  . Sulfa Antibiotics Nausea And Vomiting  . Adhesive [Tape] Rash      The results of significant diagnostics from this hospitalization (including imaging, microbiology, ancillary and laboratory) are listed below for reference.    Significant Diagnostic Studies: Ct Head Wo Contrast  04/27/2015  CLINICAL DATA:  Confusion and difficulty speaking EXAM: CT HEAD WITHOUT CONTRAST TECHNIQUE: Contiguous axial images were obtained from the base of the skull through the vertex without intravenous contrast. COMPARISON:  10/05/2013 FINDINGS: Bony calvarium is intact. Mild atrophic changes are seen. No findings to suggest acute hemorrhage, acute infarction or space-occupying mass lesion are seen. A a small lacunar infarct is noted in the anterior aspect of the thalamus on the right. IMPRESSION: Chronic atrophic and ischemic changes without acute abnormality. Electronically Signed   By: Inez Catalina M.D.   On: 04/27/2015 17:16   Mr Brain Wo Contrast  04/28/2015  CLINICAL DATA:  70 year old female with abnormal speech and increased chronic tremor. Confusion. Initial encounter. EXAM: MRI HEAD WITHOUT  CONTRAST MRA HEAD WITHOUT CONTRAST TECHNIQUE: Multiplanar, multiecho pulse sequences of the brain and surrounding structures were obtained without intravenous contrast. Angiographic images of the head were obtained using MRA technique without contrast. COMPARISON:  Head CT without contrast 04/27/2015. Brain MRI 10/05/2013. FINDINGS: MRI HEAD FINDINGS Major intracranial vascular flow voids are stable. Cerebral volume is not significantly changed since 2015. Mildly noisy diffusion-weighted imaging, but no convincing restricted diffusion to suggest acute infarct. No midline shift, mass effect, evidence of mass lesion, ventriculomegaly, extra-axial collection or acute intracranial hemorrhage. Cervicomedullary junction and pituitary are within normal limits. 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 Otherwise gray and white matter signal is stable and normal for age. No cortical encephalomalacia. No chronic cerebral blood products. Brainstem and cerebellum are within normal limits. Visible internal auditory structures appear normal.  Trace bilateral mastoid fluid is stable. Negative nasopharynx. Stable trace paranasal sinus mucosal thickening. Stable orbits soft tissues. Negative scalp soft tissues. Mild chronic disc and endplate degeneration in the visible cervical spine at C4-C5. Normal bone marrow signal. MRA HEAD FINDINGS Antegrade flow in the posterior circulation with codominant distal vertebral Arteries. Normal PICA origins. No distal vertebral artery or basilar stenosis. Normal SCA and left PCA origin. Fetal type right PCA origin, the right P1 segment appears irregular and stenotic (series 808, image 14). Bilateral PCA branches are within normal limits. Left posterior communicating artery is present. Antegrade flow in both ICA siphons. No siphon stenosis. Normal ophthalmic and posterior communicating artery origins. Patent carotid termini. Normal MCA and ACA origins  anterior communicating artery and visualized bilateral ACA branches are within normal limits. MCA M1 segments and MCA bifurcations are within normal limits. Visualized bilateral MCA branches are within normal limits (infundibula at the right anterior temporal and lenticulostriate artery origins). IMPRESSION: 1.  No acute intracranial abnormality. 2. 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 3. Negative intracranial MRA aside from atherosclerotic and stenotic right PCA P1 segment (right thalamostriate artery region). Fortunately, there is a fetal type right PCA origin such that the more distal right PCA branches remain normal. Electronically Signed   By: Genevie Ann M.D.   On: 04/28/2015 11:39   Mr Jodene Nam Head/brain Wo Cm  04/28/2015  CLINICAL DATA:  70 year old female with abnormal speech and increased chronic tremor. Confusion. Initial encounter. EXAM: MRI HEAD WITHOUT CONTRAST MRA HEAD WITHOUT CONTRAST TECHNIQUE: Multiplanar, multiecho pulse sequences of the brain and surrounding structures were obtained without intravenous contrast. Angiographic images of the head were obtained using MRA technique without contrast. COMPARISON:  Head CT without contrast 04/27/2015. Brain MRI 10/05/2013. FINDINGS: MRI HEAD FINDINGS Major intracranial vascular flow voids are stable. Cerebral volume is not significantly changed since 2015. Mildly noisy diffusion-weighted imaging, but no convincing restricted diffusion to suggest acute infarct. No midline shift, mass effect, evidence of mass lesion, ventriculomegaly, extra-axial collection or acute intracranial hemorrhage. Cervicomedullary junction and pituitary are within normal limits. 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 Otherwise gray and white matter signal is stable and normal for age. No cortical encephalomalacia. No chronic cerebral blood products. Brainstem and cerebellum are within  normal limits. Visible internal auditory structures appear normal. Trace bilateral mastoid fluid is stable. Negative nasopharynx. Stable trace paranasal sinus mucosal thickening. Stable orbits soft tissues. Negative scalp soft tissues. Mild chronic disc and endplate degeneration in the visible cervical spine at C4-C5. Normal bone marrow signal. MRA HEAD FINDINGS Antegrade flow in the posterior circulation with codominant distal vertebral Arteries. Normal PICA origins. No distal vertebral artery or basilar stenosis. Normal SCA and left PCA origin. Fetal type right PCA origin, the right P1 segment appears irregular and stenotic (series 808, image 14). Bilateral PCA branches are within normal limits. Left posterior communicating artery is present. Antegrade flow in both ICA siphons. No siphon stenosis. Normal ophthalmic and posterior communicating artery origins. Patent carotid termini. Normal MCA and ACA origins anterior communicating artery and visualized bilateral ACA branches are within normal limits. MCA M1 segments and MCA bifurcations are within normal limits. Visualized bilateral MCA branches are within normal limits (infundibula at the right anterior temporal and lenticulostriate artery origins). IMPRESSION: 1.  No acute intracranial abnormality. 2. 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 3. Negative intracranial MRA aside from atherosclerotic and stenotic right PCA P1 segment (right thalamostriate artery region). Fortunately, there is a fetal type right PCA origin such that the more distal right PCA branches remain normal. Electronically Signed   By: Genevie Ann M.D.   On: 04/28/2015 11:39    Microbiology: Recent Results (from the past 240 hour(s))  Culture, Urine     Status: None   Collection Time: 04/27/15  6:27 PM  Result Value Ref Range Status   Specimen Description URINE, CLEAN CATCH  Final   Special Requests NONE  Final   Culture   Final     MULTIPLE SPECIES PRESENT, SUGGEST RECOLLECTION Performed at [facility]    Report Status 04/29/2015 FINAL  Final     Labs: Basic Metabolic Panel:  Recent Labs Lab 04/27/15 1717 04/27/15 1724 04/28/15 0945 04/29/15 0901  NA 134* 134* 138 141  K 4.2 4.2 3.4* 4.1  CL 96* 95* 101 103  CO2 29  --  28 31  GLUCOSE 378* 367* 82 163*  BUN 15 15 8 13   CREATININE 0.71 0.60 0.63 0.67  CALCIUM 8.8*  --  8.8* 8.6*  MG  --   --  1.8  --    Liver Function Tests:  Recent Labs Lab 04/27/15 1717  AST 28  ALT 28  ALKPHOS 79  BILITOT 0.5  PROT 6.7  ALBUMIN 3.8   No results for input(s): LIPASE, AMYLASE in the last 168 hours.  Recent Labs Lab 04/27/15 2321 04/28/15 1220  AMMONIA 23 35   CBC:  Recent Labs Lab 04/27/15 1717 04/27/15 1724  WBC 5.8  --   NEUTROABS 3.8  --   HGB 12.7 14.6  HCT 38.4 43.0  MCV 100.8*  --   PLT 269  --    Cardiac Enzymes: No results for input(s): CKTOTAL, CKMB, CKMBINDEX, TROPONINI in the last 168 hours. BNP: BNP (last 3 results) No results for input(s): BNP in the last 8760 hours.  ProBNP (last 3 results) No results for input(s): PROBNP in the last 8760 hours.  CBG:  Recent Labs Lab 04/28/15 1552 04/28/15 1635 04/28/15 2225 04/29/15 0646 04/29/15 1125  GLUCAP 288* 300* 162* 168* 160*    Signed:  Velvet Bathe MD.  [facility] 04/29/2015, 2:11 PM

## 2015-04-29 NOTE — Care Management Note (Signed)
Case Management Note  Patient Details  Name: Leslie Mccann MRN: [id number] Date of Birth: [birth year]  Subjective/Objective:                    Action/Plan: Patient discharging home with self care. Orders placed per MD for outpatient PT. No further needs per CM.   Expected Discharge Date:   (unknown)               Expected Discharge Plan:  Home/Self Care  In-House Referral:     Discharge planning Services     Post Acute Care Choice:    Choice offered to:     DME Arranged:    DME Agency:     HH Arranged:    [place] Agency:     Status of Service:  Completed, signed off  Medicare Important Message Given:    Date Medicare IM Given:    Medicare IM give by:    Date Additional Medicare IM Given:    Additional Medicare Important Message give by:     If discussed at [place] of Stay Meetings, dates discussed:    Additional Comments:  Pollie Friar, RN 04/29/2015, 2:30 PM

## 2015-04-29 NOTE — Consult Note (Signed)
Interval History:                                                                                                                      Leslie Mccann is an 70 y.o. female patient with  psychiatric hx on abilify, as well as DMI. Yesterday am patient was home and very anxious before going to her doctors appointment. Apparently this is something that she expressed earlier to husband. 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 When she made it to the doctor she was very nervous at the office and seemed to not remember her date of birth and made some other similar errors which made the family and PMD concerned and the pt was then sent to the ED for evaluation. Today she is able to repeat, name, express and understand all what is said to her. She is confused and husband states she has baseline and worsening confusion over time.   EEG showed intermittent abnormal epileptiform discharges in the left frontal region suggestive of underlying epileptogenic potential, no electrographic seizures were noted. Hence I suspect that the patient's symptoms are mostly secondary to a seizure.    Past Medical History: Past Medical History  Diagnosis Date  . Diabetes mellitus   . Depression   . Anxiety   . Retinopathy   . TIA (transient ischemic attack) 10/16/2013  . Diabetic retinopathy ([place]) 10/16/2013  . Snorings 10/16/2013  . 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 Asthma     childhood  . Tremors of nervous system     Past Surgical History  Procedure Laterality Date  . Eye surgeries     . Cataract extraction, bilateral    . Colonoscopy      Family History: Family History  Problem Relation Age of Onset  . Cancer Mother   . COPD Father   . Diabetes Father   . Hypertension Maternal Grandmother   . Stroke Maternal Grandmother   . Stroke Maternal Grandfather   . Cancer Maternal Grandfather   . Hypothyroidism Daughter      Social History:   reports that she quit smoking about 17 years ago. Her smoking use included Cigarettes. She has a 10 pack-year smoking history. She has never used smokeless tobacco. She reports that she drinks alcohol. She reports that she does not use illicit drugs.  Allergies:  Allergies  Allergen Reactions  . Codeine Nausea And Vomiting  . Sulfa Antibiotics Nausea And Vomiting  . Adhesive [Tape] Rash     Medications:  Current facility-administered medications:  .  ARIPiprazole (ABILIFY) tablet 2 mg, 2 mg, Oral, QPM, Ilene Qua Opyd, MD, 2 mg at 04/28/15 2141 .  aspirin tablet 325 mg, 325 mg, Oral, Daily, Ilene Qua Opyd, MD, 325 mg at 04/29/15 0957 .  divalproex (DEPAKOTE) DR tablet 500 mg, 500 mg, Oral, Q12H, Ram Fuller Mandril, MD, 500 mg at 04/29/15 0804 .  DULoxetine (CYMBALTA) DR capsule 40 mg, 40 mg, Oral, Daily, Ilene Qua Opyd, MD, 40 mg at 04/29/15 0957 .  enoxaparin (LOVENOX) injection 40 mg, 40 mg, Subcutaneous, Q24H, Ilene Qua Opyd, MD, 40 mg at 04/28/15 2109 .  insulin aspart (novoLOG) injection 0-15 Units, 0-15 Units, Subcutaneous, TID WC, Vianne Bulls, MD, 3 Units at 04/29/15 0650 .  insulin glargine (LANTUS) injection 8 Units, 8 Units, Subcutaneous, QHS, Vianne Bulls, MD, 8 Units at 04/28/15 2107 .  levothyroxine (SYNTHROID, LEVOTHROID) tablet 100 mcg, 100 mcg, Oral, QAC breakfast, Vianne Bulls, MD, 100 mcg at 04/29/15 0804 .  LORazepam (ATIVAN) injection 1 mg, 1 mg, Intravenous, Q4H PRN, Ram Fuller Mandril, MD .  LORazepam (ATIVAN) tablet 1 mg, 1 mg, Oral, BID, Vianne Bulls, MD, 1 mg at 04/29/15 0957 .  raloxifene (EVISTA) tablet 60 mg, 60 mg, Oral, QPM, Ilene Qua Opyd, MD, 60 mg at 04/28/15 1833 .  senna-docusate (Senokot-S) tablet 1 tablet, 1 tablet, Oral, QHS PRN, Ilene Qua Opyd, MD .  traMADol (ULTRAM) tablet 50 mg, 50  mg, Oral, Q6H PRN, Vianne Bulls, MD .  Vitamin D (Ergocalciferol) (DRISDOL) capsule 50,000 Units, 50,000 Units, Oral, Q7 days, Vianne Bulls, MD, 50,000 Units at 04/27/15 2358   Neurologic Examination:                                                                                                     Today's Vitals   04/29/15 0330 04/29/15 0500 04/29/15 0603 04/29/15 0939  BP:   104/53 117/65  Pulse:   70 77  Temp:   97.7 F (36.5 C) 98.9 F (37.2 C)  TempSrc:   Oral Oral  Resp:   17 18  Height:      Weight:      SpO2:   95% 97%  PainSc: Asleep Asleep      Evaluation of higher integrative functions including: Level of alertness: Alert,  Oriented to time, place and person but not year Speech: fluent, no evidence of dysarthria or aphasia noted.  Test the following cranial nerves: 2-12 grossly intact Motor examination: Normal tone, bulk, full 5/5 motor strength in all 4 extremities Examination of sensation : Normal and symmetric sensation to pinprick in all 4 extremities and on face Examination of deep tendon reflexes: no DTR   Lab Results: Basic Metabolic Panel:  Recent Labs Lab 04/27/15 1717 04/27/15 1724 04/28/15 0945 04/29/15 0901  NA 134* 134* 138 141  K 4.2 4.2 3.4* 4.1  CL 96* 95* 101 103  CO2 29  --  28 31  GLUCOSE 378* 367* 82 163*  BUN 15 15 8 13   CREATININE 0.71 0.60 0.63  0.67  CALCIUM 8.8*  --  8.8* 8.6*  MG  --   --  1.8  --     Liver Function Tests:  Recent Labs Lab 04/27/15 1717  AST 28  ALT 28  ALKPHOS 79  BILITOT 0.5  PROT 6.7  ALBUMIN 3.8   No results for input(s): LIPASE, AMYLASE in the last 168 hours.  Recent Labs Lab 04/27/15 2321 04/28/15 1220  AMMONIA 23 35    CBC:  Recent Labs Lab 04/27/15 1717 04/27/15 1724  WBC 5.8  --   NEUTROABS 3.8  --   HGB 12.7 14.6  HCT 38.4 43.0  MCV 100.8*  --   PLT 269  --     Cardiac Enzymes: No results for input(s): CKTOTAL, CKMB, CKMBINDEX, TROPONINI in the last 168  hours.  Lipid Panel:  Recent Labs Lab 04/29/15 0901  CHOL 239*  TRIG 47  HDL 92  CHOLHDL 2.6  VLDL 9  LDLCALC 138*    CBG:  Recent Labs Lab 04/28/15 1207 04/28/15 1552 04/28/15 1635 04/28/15 2225 04/29/15 0646  GLUCAP 102* 288* 300* 162* 168*    Microbiology: No results found for this or any previous visit.  Imaging: Ct Head Wo Contrast  04/27/2015  CLINICAL DATA:  Confusion and difficulty speaking EXAM: CT HEAD WITHOUT CONTRAST TECHNIQUE: Contiguous axial images were obtained from the base of the skull through the vertex without intravenous contrast. COMPARISON:  10/05/2013 FINDINGS: Bony calvarium is intact. Mild atrophic changes are seen. No findings to suggest acute hemorrhage, acute infarction or space-occupying mass lesion are seen. A a small lacunar infarct is noted in the anterior aspect of the thalamus on the right. IMPRESSION: Chronic atrophic and ischemic changes without acute abnormality. Electronically Signed   By: Inez Catalina M.D.   On: 04/27/2015 17:16   Mr Brain Wo Contrast  04/28/2015  CLINICAL DATA:  70 year old female with abnormal speech and increased chronic tremor. Confusion. Initial encounter. EXAM: MRI HEAD WITHOUT CONTRAST MRA HEAD WITHOUT CONTRAST TECHNIQUE: Multiplanar, multiecho pulse sequences of the brain and surrounding structures were obtained without intravenous contrast. Angiographic images of the head were obtained using MRA technique without contrast. COMPARISON:  Head CT without contrast 04/27/2015. Brain MRI 10/05/2013. FINDINGS: MRI HEAD FINDINGS Major intracranial vascular flow voids are stable. Cerebral volume is not significantly changed since 2015. Mildly noisy diffusion-weighted imaging, but no convincing restricted diffusion to suggest acute infarct. No midline shift, mass effect, evidence of mass lesion, ventriculomegaly, extra-axial collection or acute intracranial hemorrhage. Cervicomedullary junction and pituitary are within normal  limits. 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 Otherwise gray and white matter signal is stable and normal for age. No cortical encephalomalacia. No chronic cerebral blood products. Brainstem and cerebellum are within normal limits. Visible internal auditory structures appear normal. Trace bilateral mastoid fluid is stable. Negative nasopharynx. Stable trace paranasal sinus mucosal thickening. Stable orbits soft tissues. Negative scalp soft tissues. Mild chronic disc and endplate degeneration in the visible cervical spine at C4-C5. Normal bone marrow signal. MRA HEAD FINDINGS Antegrade flow in the posterior circulation with codominant distal vertebral Arteries. Normal PICA origins. No distal vertebral artery or basilar stenosis. Normal SCA and left PCA origin. Fetal type right PCA origin, the right P1 segment appears irregular and stenotic (series 808, image 14). Bilateral PCA branches are within normal limits. Left posterior communicating artery is present. Antegrade flow in both ICA siphons. No siphon stenosis. Normal ophthalmic and posterior communicating artery origins.  Patent carotid termini. Normal MCA and ACA origins anterior communicating artery and visualized bilateral ACA branches are within normal limits. MCA M1 segments and MCA bifurcations are within normal limits. Visualized bilateral MCA branches are within normal limits (infundibula at the right anterior temporal and lenticulostriate artery origins). IMPRESSION: 1.  No acute intracranial abnormality. 2. 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 3. Negative intracranial MRA aside from atherosclerotic and stenotic right PCA P1 segment (right thalamostriate artery region). Fortunately, there is a fetal type right PCA origin such that the more distal right PCA branches remain normal. Electronically Signed   By: Genevie Ann M.D.   On: 04/28/2015 11:39   Mr Jodene Nam  Head/brain Wo Cm  04/28/2015  CLINICAL DATA:  70 year old female with abnormal speech and increased chronic tremor. Confusion. Initial encounter. EXAM: MRI HEAD WITHOUT CONTRAST MRA HEAD WITHOUT CONTRAST TECHNIQUE: Multiplanar, multiecho pulse sequences of the brain and surrounding structures were obtained without intravenous contrast. Angiographic images of the head were obtained using MRA technique without contrast. COMPARISON:  Head CT without contrast 04/27/2015. Brain MRI 10/05/2013. FINDINGS: MRI HEAD FINDINGS Major intracranial vascular flow voids are stable. Cerebral volume is not significantly changed since 2015. Mildly noisy diffusion-weighted imaging, but no convincing restricted diffusion to suggest acute infarct. No midline shift, mass effect, evidence of mass lesion, ventriculomegaly, extra-axial collection or acute intracranial hemorrhage. Cervicomedullary junction and pituitary are within normal limits. 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 Otherwise gray and white matter signal is stable and normal for age. No cortical encephalomalacia. No chronic cerebral blood products. Brainstem and cerebellum are within normal limits. Visible internal auditory structures appear normal. Trace bilateral mastoid fluid is stable. Negative nasopharynx. Stable trace paranasal sinus mucosal thickening. Stable orbits soft tissues. Negative scalp soft tissues. Mild chronic disc and endplate degeneration in the visible cervical spine at C4-C5. Normal bone marrow signal. MRA HEAD FINDINGS Antegrade flow in the posterior circulation with codominant distal vertebral Arteries. Normal PICA origins. No distal vertebral artery or basilar stenosis. Normal SCA and left PCA origin. Fetal type right PCA origin, the right P1 segment appears irregular and stenotic (series 808, image 14). Bilateral PCA branches are within normal limits. Left posterior communicating artery is present.  Antegrade flow in both ICA siphons. No siphon stenosis. Normal ophthalmic and posterior communicating artery origins. Patent carotid termini. Normal MCA and ACA origins anterior communicating artery and visualized bilateral ACA branches are within normal limits. MCA M1 segments and MCA bifurcations are within normal limits. Visualized bilateral MCA branches are within normal limits (infundibula at the right anterior temporal and lenticulostriate artery origins). IMPRESSION: 1.  No acute intracranial abnormality. 2. 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 3. Negative intracranial MRA aside from atherosclerotic and stenotic right PCA P1 segment (right thalamostriate artery region). Fortunately, there is a fetal type right PCA origin such that the more distal right PCA branches remain normal. Electronically Signed   By: Genevie Ann M.D.   On: 04/28/2015 11:39   EEG completed:  at the time of finalizing this note. It showed intermittent abnormal epileptiform discharges in the left frontal region suggestive of underlying epileptogenic potential, no electrographic seizures were noted. Hence I suspect that the patient's symptoms are mostly secondary to a seizure          Assessment and plan:   Shantique Detoro  Durrani is an 70 y.o. female patient with confusion and sent to ED to rule out encephalopathy versus stroke. MRI brain showed no acute stroke. She remains slightly confused and husband states she has worsening memory at baseline. She is able to name, repeat, express herself and follow commands. UA showed mild leukocytosis and culture pending.  Due to abnormal EEG she needs to be on an antiepileptic medication. Given her underlying psychiatric issues, and more surprising agent such as Depakote would be a good choice as an antiepileptic medication for her.  She has been started on Depakote and will need follow-up with outpatient neurology for continued monitoring of  Depakote levels, liver function tests, platelets, and further dose titration based on her clinical symptoms and to monitor for side effects.    I have spent over 45 minutes discussing EEG, seizure, medications, follow up, need for medication and SE with patient. No driving, operating heavy machinery, perform activities at heights, swimming or participation in water activities until release by outpatient physician.  This has been discussed with patient.

## 2015-04-29 NOTE — Evaluation (Signed)
\Speech Language Pathology Evaluation Patient Details Name: Leslie Mccann MRN: [id number] DOB: Oct 06, 1945 Today's Date: 04/29/2015 Time: 1015-1050 SLP Time Calculation (min) (ACUTE ONLY): 35 min  Problem List:  Patient Active Problem List   Diagnosis Date Noted  . Seizures ([place])   . Difficulty speaking 04/27/2015  . Insulin dependent diabetes mellitus ([place]) 04/27/2015  . Hyponatremia 04/27/2015  . Hypochloremia 04/27/2015  . Hypothyroidism 04/27/2015  . Anxiety 04/27/2015  . Acute encephalopathy 04/27/2015  . Speech abnormality   . Nocturnal hypoxemia 12/17/2013  . TIA (transient ischemic attack) 10/16/2013  . Diabetic retinopathy ([place]) 10/16/2013  . Transient alteration of awareness 10/16/2013  . DM retinopathy ([place]) 10/16/2013  . Snorings 10/16/2013  . 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 Diabetes mellitus   . Depression   . Anxiety   . Retinopathy   . TIA (transient ischemic attack) 10/16/2013  . Diabetic retinopathy ([place]) 10/16/2013  . Snorings 10/16/2013  . 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 Asthma     childhood  . Tremors of nervous system    Past Surgical History:  Past Surgical History  Procedure Laterality Date  . Eye surgeries     . Cataract extraction, bilateral    . Colonoscopy     HPI:  Pt admitted with confusion, difficulty speaking from MD office, MRI negative. PMhx: DM, psych history, anxiety, tremors   Assessment / Plan / Recommendation Clinical Impression  Patient presents with a very mild cognitive-linguistic impairment which only presented when patient completing complex verbal problem solving, but her reasoning, awareness, attention were all WFL at basic and complex verbal levels. Per patient and family, she presented with expressive aphasia from admission until yesterday, during which, as her sister explained, "she had visions or feelings of what  she wanted to say, but couldn't say it". Per chart review and patient/family report, patient appears to have had a transient expressive aphasia with anomia, but currently, patient does not exhibit any expressive or receptive language impairment. Recommended to family that someone check over her work when she is completing bills, etc for the first time.    SLP Assessment  Patient does not need any further Speech Lanaguage Pathology Services    Follow Up Recommendations  None    Frequency and Duration           SLP Evaluation Prior Functioning  Cognitive/Linguistic Baseline: Within functional limits Type of Home: House Available Help at Discharge: Family;Available 24 hours/day   Cognition  Overall Cognitive Status: Impaired/Different from baseline Orientation Level: Oriented X4 Memory: Appears intact Awareness: Appears intact Problem Solving: Impaired Problem Solving Impairment: Verbal complex Safety/Judgment: Appears intact    Comprehension  Auditory Comprehension Overall Auditory Comprehension: Appears within functional limits for tasks assessed    Expression Expression Primary Mode of Expression: Verbal Verbal Expression Overall Verbal Expression: Appears within functional limits for tasks assessed Initiation: No impairment Level of Generative/Spontaneous Verbalization: Sentence Repetition: No impairment Naming: No impairment Pragmatics: No impairment Non-Verbal Means of Communication: Not applicable   Oral / Motor  Oral Motor/Sensory Function Overall Oral Motor/Sensory Function: Within functional limits Motor Speech Overall Motor Speech: Appears within functional limits for tasks assessed   GO          Functional Limitations: Spoken language expressive Spoken Language Comprehension Current Status [phone]): 0 percent impaired, limited or restricted Spoken Language Comprehension Goal Status ([id number]): 0 percent impaired, limited or restricted Spoken Language  Comprehension Discharge Status [phone]): 0 percent impaired, limited or restricted Spoken Language Expression Current Status [phone]): 0 percent impaired, limited or restricted Spoken Language Expression Goal Status [id number]): 0 percent impaired, limited or restricted Spoken Language Expression Discharge Status [phone]): 0 percent impaired, limited or restricted         Dannial Monarch 04/29/2015, 4:44 PM   Sonia Baller, MA, CCC-SLP 04/29/2015 4:44 PM

## 2015-04-30 NOTE — Progress Notes (Signed)
   04/28/15 1331  PT G-Codes **NOT FOR INPATIENT CLASS**  Functional Assessment Tool Used clinical judgement  Functional Limitation Mobility: Walking and moving around  Mobility: Walking and Moving Around Current Status [id number]) CI  Mobility: Walking and Moving Around Goal Status [id number]) CH  Addendum to P.T. Note from 04/28/15 Elwyn Reach, [place]

## 2015-05-03 ENCOUNTER — Encounter: Payer: Self-pay | Admitting: Neurology

## 2015-05-03 ENCOUNTER — Ambulatory Visit (INDEPENDENT_AMBULATORY_CARE_PROVIDER_SITE_OTHER): Payer: Medicare Other | Admitting: Neurology

## 2015-05-03 VITALS — BP 132/52 | HR 78 | Resp 16 | Ht 66.0 in | Wt 129.0 lb

## 2015-05-03 DIAGNOSIS — G2111 Neuroleptic induced parkinsonism: Secondary | ICD-10-CM | POA: Diagnosis not present

## 2015-05-03 DIAGNOSIS — Z87898 Personal history of other specified conditions: Secondary | ICD-10-CM | POA: Diagnosis not present

## 2015-05-03 DIAGNOSIS — G40909 Epilepsy, unspecified, not intractable, without status epilepticus: Secondary | ICD-10-CM | POA: Diagnosis not present

## 2015-05-03 DIAGNOSIS — R490 Dysphonia: Secondary | ICD-10-CM

## 2015-05-03 DIAGNOSIS — R2689 Other abnormalities of gait and mobility: Secondary | ICD-10-CM

## 2015-05-03 DIAGNOSIS — R479 Unspecified speech disturbances: Secondary | ICD-10-CM

## 2015-05-03 DIAGNOSIS — Z9289 Personal history of other medical treatment: Secondary | ICD-10-CM

## 2015-05-03 MED ORDER — DIVALPROEX SODIUM ER 500 MG PO TB24: 1000.0000 mg | ORAL_TABLET | Freq: Every day | ORAL | Status: DC

## 2015-05-03 NOTE — Progress Notes (Signed)
Subjective:    Patient ID: Leslie Mccann is a 70 y.o. female.  HPI     Star Age, MD, PhD [facility] [address] [place], [place] [address]  Dear Dr. Forde Dandy,  I saw your patient, Leslie Mccann, upon your kind request in my neurologic clinic today for initial consultation of her tremors and after her recent hospitalization. The patient is accompanied by her husband today. As you know, Leslie Mccann is a 70 year old right-handed woman with an underlying medical history of depression, hypothyroidism, osteopenia, prior diagnosis of OSA, not using CPAP, using nocturnal oxygen, hyperlipidemia, anxiety, diabetes with neuropathy and retinopathy, who I have previously seen once on 09/26/2012 for a gait disorder and recurrent falls. She was at the emergency room after a fall and had workup in the form of chest x-ray, and head CT on 09/21/2012 without contrast which showed no intracranial acute pathology of mild cortical volume loss and scattered small vessel ischemic microangiopathy. UDS was negative at the time. She also reported a fall on 08/02/2012. She reported some memory loss at the time. She also reported a hand tremor and had some medication changes in her psychiatric medications including discontinuation of Prozac, she was also taken off of Cogentin and quit taking Wellbutrin. She did not return for follow-up. In the interim, she was seen by Dr. Brett Fairy for a suspected TIA or alteration in consciousness level, she was seen on 10/16/2013, I reviewed the note. EEG was reported as normal on 11/13/2013.  Today, 05/03/2015: She reports that she had a 2 day spell of word finding difficulty.   I reviewed your office note from 04/27/2015. A1c was 8.7 at the time. Blood work from 04/27/2015 showed blood sugar level of 321, creatinine 0.7, CMP unremarkable, total cholesterol 247, LDL 139, TSH 0.82, free T4 normal, vitamin D low normal at 31.9.  Of  note, she then  presented to the emergency room from your office on 04/27/2015 with altered mental status and memory loss as well as trouble speaking. I reviewed the emergency room records. She was admitted to the hospital for TIA or stroke workup. I reviewed the hospital records including neurological consultation note, EEG, discharge summary, MRI and CT head results. RPR nonreactive, normal ammonia, negative blood on call level.  She had an echocardiogram on 04/29/2015: Impressions:   - Normal LV systolic function; grade 1 diastolic dysfunction;   calcified aortic valve with no AS; mildly dilated aortic root;   atrial septal aneurysm. Of note, she had an EEG while inpatient and I reviewed the EEG report from 04/28/2015:  Impression: Abnormal routine inpatient EEG suggestive of underlying neuronal dysfunction with epileptogenic potential in the left frontal region as described. No electrographic seizures were seen during this recording Clinical correlation is recommended. Based on her test results and her clinical presentation, she was suspected to have underlying seizures. She was started on Depakote 500 mg bid.  Head CT without contrast on 04/27/2015 showed: IMPRESSION: Chronic atrophic and ischemic changes without acute abnormality.    In addition, personally reviewed the images through the PACS system. She had a brain MRI without contrast and head MRA without contrast on 04/28/2015: IMPRESSION: 1.  No acute intracranial abnormality. 2. 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 3. Negative intracranial MRA aside from atherosclerotic and stenotic right PCA P1 segment (right thalamostriate artery region). Fortunately, there is a fetal type right PCA origin such that  the more distal right PCA branches remain normal.   In addition, personally reviewed the images through the PACS system. Of note, she also had a prior brain MRI with and without contrast on  10/05/2013: IMPRESSION: 1.  No acute intracranial abnormality. 2. Mild for age nonspecific signal changes in the brain, most commonly due to chronic small vessel disease.  Her husband reports, she has had difficulty with blood sugar controls, highs and lows, working on new regimen. She has been on Abilify for years. Has tremors, which are getting worse, husband feels, VPA is making her groggy. She has fallen in the past. He has noted changes in the gait. She has LBP. Tremor is intermittent, R more than L hand.   Previously:  09/26/2012: Leslie Mccann is a 70 year old right-handed woman with an underlying medical history of depression, anxiety, diabetes and retinopathy, who was recently at the emergency room after a fall. She was noted to have altered mental status. She was seen on 09/21/2012 and had workup including a chest x-ray which was negative for any fracture or cardiopulmonary disease. She had a head CT without contrast which showed no acute intracranial pathology and mild cortical volume loss and scattered small vessel ischemic microangiopathy. She had a UDS which was negative. She has recently had some changes in her psychiatric medications and may have not taken some of her medication correctly.   She also had fallen on 08/02/2012 and went to the emergency room after hitting her head falling backwards. She had a head CT and cervical spine CT and I reviewed the reports:  Mild atrophy and chronic microvascular ischemic change. No acute intracranial findings. No skull fracture.   CT CERVICAL SPINE: There is severe disc space narrowing at C4-5, C5-C6, and moderate disc space narrowing at C6-7. There is facet mediated slip of 2-3 mm at C3-C4. There is no cervical spine fracture or traumatic subluxation. No intraspinal hematoma or mass. No prevertebral soft tissue swelling. Carotid calcification. No lung apex lesion.   Of note, she is on Abilify, baby aspirin, vitamin B complex, Cymbalta, ibuprofen,  Lantus insulin, Humalog insulin, Synthroid, Ativan, Evista, vitamin D.  She has had memory loss for the past.    She has had a hand tremor of about a year. She was taken off of Prozac and was changed her to Brintellix about a month ago, but this was stopped 3 weeks ago. She was taken off Cogentin a few days ago and quit Wellbutrin a couple of days ago. She has been drinking a glas of wine each night, but denies heavy drinking in the past and quit smoking in 2000. She has had some high CBS values. A few days ago she has had a blood sugar of 600 per husband.      Her Past Medical History Is Significant For: Past Medical History  Diagnosis Date  . Diabetes mellitus   . Depression   . Anxiety   . Retinopathy   . TIA (transient ischemic attack) 10/16/2013  . Diabetic retinopathy ([place]) 10/16/2013  . Snorings 10/16/2013  . 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 Asthma     childhood  . Tremors of nervous system     Her Past Surgical History Is Significant For: Past Surgical History  Procedure Laterality Date  . Eye surgeries     . Cataract extraction, bilateral    . Colonoscopy      Her Family History Is Significant For: Family History  Problem Relation Age of Onset  . Cancer  Mother   . COPD Father   . Diabetes Father   . Hypertension Maternal Grandmother   . Stroke Maternal Grandmother   . Stroke Maternal Grandfather   . Cancer Maternal Grandfather   . Hypothyroidism Daughter     Her Social History Is Significant For: Social History   Social History  . Marital Status: Married    Spouse Name: [name]  . Number of Children: 1  . Years of Education: [employer]   Social History Main Topics  . Smoking status: Former Smoker -- 1.00 packs/day for 10 years    Types: Cigarettes    Quit date: 02/12/1998  . Smokeless tobacco: Never Used  . Alcohol Use: 0.0 oz/week    0 Standard drinks or equivalent per week     Comment: occassional   . Drug Use: No  . Sexual Activity: Not Asked    Other Topics Concern  . None   Social History Narrative   Patient is married [name]) and lives at home with her husband.   Patient has one adult child.   Patient has a college education.   Patient is right-handed.   Patient drinks 1-2 cups of coffee daily.    Her Allergies Are:  Allergies  Allergen Reactions  . Codeine Nausea And Vomiting  . Sulfa Antibiotics Nausea And Vomiting  . Adhesive [Tape] Rash  :   Her Current Medications Are:  Outpatient Encounter Prescriptions as of 05/03/2015  Medication Sig  . ARIPiprazole (ABILIFY) 2 MG tablet Take 2 mg by mouth every evening.   Marland Kitchen aspirin 325 MG tablet Take 1 tablet (325 mg total) by mouth daily.  . DULoxetine (CYMBALTA) 20 MG capsule Take 40 mg by mouth daily.   Marland Kitchen GLUCAGON EMERGENCY 1 MG injection   . Insulin Degludec (TRESIBA FLEXTOUCH) 100 UNIT/ML SOPN Inject into the skin.  Marland Kitchen insulin glargine (LANTUS) 100 UNIT/ML injection Inject 0.08 mLs (8 Units total) into the skin at bedtime.  . insulin lispro (HUMALOG) 100 UNIT/ML injection Inject 4-8 Units into the skin 3 (three) times daily before meals.  Marland Kitchen levothyroxine (SYNTHROID, LEVOTHROID) 100 MCG tablet Take 100 mcg by mouth every morning.   Marland Kitchen LORazepam (ATIVAN) 1 MG tablet Take 1 mg by mouth 2 (two) times daily.   . Misc. Devices (FINGERTIP PULSE OXIMETER) MISC Use as directed  . Misc. Devices (PULSE OXIMETER) MISC 1 Device by Does not apply route as directed.  . ONE TOUCH ULTRA TEST test strip   . raloxifene (EVISTA) 60 MG tablet Take 60 mg by mouth every evening.   . traMADol (ULTRAM) 50 MG tablet Take 50 mg by mouth every 6 (six) hours as needed for moderate pain or severe pain. As needed  . Vitamin D, Ergocalciferol, (DRISDOL) 50000 UNITS CAPS capsule Take 50,000 Units by mouth every 7 (seven) days.   . [DISCONTINUED] divalproex (DEPAKOTE) 500 MG DR tablet Take 1 tablet (500 mg total) by mouth every 12 (twelve) hours.  . divalproex (DEPAKOTE ER) 500 MG 24 hr tablet  Take 2 tablets (1,000 mg total) by mouth at bedtime.   No facility-administered encounter medications on file as of 05/03/2015.  :   Review of Systems:  Out of a complete 14 point review of systems, all are reviewed and negative with the exception of these symptoms as listed below:   Review of Systems  Neurological:       Patient is here for evaluation of tremors and possible seizure. Just got out of the hospital for  possible seizure. Husband states that patient just started Depakote and it makes her groggy and confused.     Objective:  Neurologic Exam  Physical Exam Physical Examination:   Filed Vitals:   05/03/15 1143  BP: 132/52  Pulse: 78  Resp: 16    General Examination: The patient is a very pleasant 70 y.o. female in no acute distress. She appears frail and deconditioned. Her history is Primarily provided by her husband, she is not very verbal, appears to be mildly sleepy.  HEENT: Normocephalic, atraumatic, pupils are equal, round and reactive to light and accommodation. Extraocular tracking is good without limitation to gaze excursion or nystagmus noted. Normal smooth pursuit is noted. Hearing is grossly intact. Face is symmetric with mild facial masking, and normal facial sensation. She has a mild decrease in eye blink rate. Speech is clear with no dysarthria noted. There is mild hypophonia. There is a slight intermittent lower lip tremor. Neck is very mildly rigid with full range of passive and active motion. There are no carotid bruits on auscultation. Oropharynx exam reveals: moderate mouth dryness, adequate dental hygiene and no significant airway crowding. Mallampati is class II. Tongue protrudes centrally and palate elevates symmetrically.  Chest: Clear to auscultation without wheezing, rhonchi or crackles noted.  Heart: S1+S2+0, regular and normal without murmurs, rubs or gallops noted.   Abdomen: Soft, non-tender and non-distended with normal bowel sounds  appreciated on auscultation.  Extremities: There is no pitting edema in the distal lower extremities bilaterally. Pedal pulses are intact.  Skin: Warm and dry without trophic changes noted. There are no varicose veins.  Musculoskeletal: exam reveals no obvious joint deformities, tenderness or joint swelling or erythema.   Neurologically:  Mental status: The patient is awake, alert and oriented in all 4 spheres. Her memory, attention, language and knowledge are mildly impaired (MMSE in 2014 was 26/30, CDT 4/4, AFT 17). There is no aphasia, agnosia, apraxia or anomia. Speech is clear with mild hypophonia noted. She appears to have mild slowness in thinking. Thought process is linear. Affect is constricted. She displays mild  Cranial nerves are as described above under HEENT exam. In addition, shoulder shrug is normal with equal shoulder height noted. Motor exam: Normal bulk, strength and tone is noted. There is no drift, or rebound. Romberg is negative. Reflexes are 1+ throughout. Toes are downgoing bilaterally. Fine motor skills are mildly impaired throughout in both upper and lower extremities, she has a very slight intermittent resting tremor in the right thumb only. She has no significant postural or action tremor. Cerebellar testing shows no dysmetria or intention tremor on finger to nose testing. Heel to shin is unremarkable bilaterally. There is no truncal or gait ataxia.  Sensory exam is intact to light touch.  Gait, station and balance: She stands up slowly and pushes herself up. Posture is mildly stooped, she has a increase in lumbar kyphosis. She walks slowly with decreased stride length and pace and decreased arm swing bilaterally. She turns in 3 steps. Tandem walk is not possible for her.  Assessment and Plan:   In summary, KERSTIE SONNER is a very pleasant 70 year old female with a history of depression and anxiety, hypothyroidism, and difficult to control diabetes, who presents for new  evaluation of her tremor, and suspected seizure disorder, after a recent hospital stay for suspected TIA, after she presented to the emergency room with new onset word finding difficulty. I reviewed her extensive hospital records and talk to the patient and her  husband about her test results. On examination, she has mild parkinsonism,  which in her case may be secondary to neuroleptic medications. She has been on Abilify for at least 3 years. We talked about her abnormal EEG finding and suspicion for seizure. She has had more lethargy since she was started on Depakote 500 mg twice daily. I suggested we change this to once daily long-acting Depakote ER. I changed the prescription to Depakote ER 500 mg strength 2 pills each night. I advised the patient that it can take up to 2 weeks to get used to taking a new medication and that it can cause sleepiness. Furthermore, we talked about seizure precautions including no driving for 6 months. Her situation is complicated as she is on multiple psychotropic medications, and she has had difficulty with blood sugar control.  I suggested they discuss with her psychiatrist a possibility of changing the Abilify. We will monitor her symptoms and physical exam. I would like to repeat an EEG in the office and we will schedule her for this. She is advised to stay better hydrated, to ensure good nutrition and change positions slowly. Thankfully she has quit smoking. I asked her to followup with me routinely in 3 months from now, sooner if the need arises. She and her husband are encouraged to call with any interim questions or concerns.  Thank you very much for allowing me to participate in the care of this nice patient. If I can be of any further assistance to you please do not hesitate to call me at [phone].  Sincerely,   Star Age, MD, PhD

## 2015-05-03 NOTE — Patient Instructions (Signed)
  Please remember, common seizure triggers are: Sleep deprivation, dehydration, overheating, stress, hypoglycemia or skipping meals, certain medications or excessive alcohol use, especially stopping alcohol abruptly if you have had heavy alcohol use before (aka alcohol withdrawal seizure). If you have a prolonged seizure over 2-5 minutes or back to back seizures, call or have someone call 911 or take you to the nearest emergency room. You cannot drive a car or operate any other machinery or vehicle within 6 months of a seizure. Please do not swim alone or take a tub bath for safety. Do not cook with large quantities of boiling water or oil for safety. Take your medicine for seizure prevention regularly and do not skip doses or stop medication abruptly and tone are told to do so by your healthcare provider. Try to get a refill on your antiepileptic medication ahead of time, so you are not at risk of running out. If you run out of the seizure medication and do not have a refill at hand she may run into medication withdrawal seizures. Avoid taking Wellbutrin, narcotic pain medications and tramadol, as they can lower seizure threshold.   We will change your depakote to Depakote ER 500 mg, 2 pills each night.   We will do an EEG (brainwave test), which we will schedule. We will call you with the results.

## 2015-05-05 ENCOUNTER — Telehealth: Payer: Self-pay | Admitting: Neurology

## 2015-05-05 DIAGNOSIS — G2111 Neuroleptic induced parkinsonism: Secondary | ICD-10-CM

## 2015-05-05 DIAGNOSIS — R479 Unspecified speech disturbances: Secondary | ICD-10-CM

## 2015-05-05 DIAGNOSIS — R2689 Other abnormalities of gait and mobility: Secondary | ICD-10-CM

## 2015-05-05 NOTE — Telephone Encounter (Addendum)
Pt's husband called said she saw Colletta Maryland Edwards(NP) at [facility] today and she suggested that Dr Rexene Alberts send a referral for what she thinks pt could work on other than gait. He said she is to call or to send a fax requesting this. Husband is requesting a call when referral is completed

## 2015-05-09 ENCOUNTER — Telehealth: Payer: Self-pay | Admitting: Neurology

## 2015-05-09 DIAGNOSIS — G40909 Epilepsy, unspecified, not intractable, without status epilepticus: Secondary | ICD-10-CM

## 2015-05-09 DIAGNOSIS — G2111 Neuroleptic induced parkinsonism: Secondary | ICD-10-CM

## 2015-05-09 DIAGNOSIS — R479 Unspecified speech disturbances: Secondary | ICD-10-CM

## 2015-05-09 DIAGNOSIS — R2689 Other abnormalities of gait and mobility: Secondary | ICD-10-CM

## 2015-05-09 DIAGNOSIS — Z9289 Personal history of other medical treatment: Secondary | ICD-10-CM

## 2015-05-09 MED ORDER — DIVALPROEX SODIUM ER 500 MG PO TB24: 500.0000 mg | ORAL_TABLET | Freq: Every day | ORAL | Status: DC

## 2015-05-09 NOTE — Telephone Encounter (Signed)
I spoke to husband and he is aware of medication change and advice below. He is on his way to Dr. Baldwin Crown office for f/u. He will ask them to fax her labs to [place] when completed. See other phone note for message about PT.

## 2015-05-09 NOTE — Telephone Encounter (Signed)
Please call Colletta Maryland (PT?) for VO on gait and balance training through PT. PT referral was done on 05/05/15 previously.

## 2015-05-09 NOTE — Telephone Encounter (Signed)
I spoke to husband and he states that patient is taking Depakote 500mg  ER (2 at night) and patient seems confused, having increased memory issues (like how to work her phone). He asks if we can lower the dose that she is taking? Denies stroke like symptoms. Husband states that patient is having trouble controlling her blood sugar levels (high and low) and wonders if VPA has an effect on CBG?  Psychiatrist has lowered Abilify from 5 to 2mg  and may take off completely soon. Also lowered Ativan from 1mg  BID to 1/2mg  BID.

## 2015-05-09 NOTE — Telephone Encounter (Signed)
Orders put in via Epic

## 2015-05-09 NOTE — Telephone Encounter (Signed)
Patient's husband [name] is calling and is very concerned about his wife.  He states she is unable to do simple tasks, is confused, and unable to carry on a conversation. He feels her Rx divalproex(DEPAKOTE) 500 MG 24 HR tablets may be too much for her.  He is wondering if the medication might also be interacting with her blood sugar as she is diabetic.  She states her phychiatrist has reduced her aripiprazole and lorazopam.  Please call.

## 2015-05-09 NOTE — Telephone Encounter (Signed)
Please call husband: We can reduce the Depakote ER to 500 mg each night, which is 1 pill each night. Please make patient's husband aware that we have to be mindful that seizures may occur at dose that is too low. On the other hand, she is having side effects including sedation from the dose she is currently on, we may just have to see how she does on the 500 mg only.

## 2015-05-13 ENCOUNTER — Other Ambulatory Visit: Payer: Self-pay | Admitting: *Deleted

## 2015-05-13 LAB — GLUCOSE, CAPILLARY: Glucose-Capillary: 268 mg/dL — ABNORMAL HIGH (ref 65–99)

## 2015-05-13 NOTE — Patient Outreach (Signed)
[facility]) Care Management  05/13/2015  Leslie Mccann March 09, 1945 [id number]   Assessment: Care coordination- first attempt Referral received from patient's physician for home evaluation of patient and environment and to engage patient with [facility] care management services. Call placed to speak to patient/ husband but unable to reach them. HIPAA compliant voice message left with name and contact information.  Plan: Will await for return call. If unable to receive a call back, will schedule for next outreach call.  Leslie Mccann, BSN, RN-BC [place] Management Coordinator Cell: [phone]

## 2015-05-16 ENCOUNTER — Encounter: Payer: Self-pay | Admitting: *Deleted

## 2015-05-16 ENCOUNTER — Other Ambulatory Visit: Payer: Self-pay | Admitting: *Deleted

## 2015-05-16 DIAGNOSIS — IMO0002 Reserved for concepts with insufficient information to code with codable children: Secondary | ICD-10-CM

## 2015-05-16 DIAGNOSIS — E1065 Type 1 diabetes mellitus with hyperglycemia: Secondary | ICD-10-CM

## 2015-05-16 DIAGNOSIS — E108 Type 1 diabetes mellitus with unspecified complications: Principal | ICD-10-CM

## 2015-05-17 NOTE — Patient Outreach (Signed)
[facility]) Care Management  05/17/2015  Leslie Mccann 07-28-1945 [id number]  Assessment: Care coordination call (second attempt) Referral received from patient's physician for home evaluation of patient and environment and to engage patient with [facility] care management services. Care coordination made with primary care provider's office (DJ) prior to contacting patient and husband. Call placed and spoke to patient and husband. Care management coordinator introduced self and explained purpose of the call. Identity verified. Patient's husband confirmed being aware of the referral. Patient was conversant, alert and oriented however, noted to be dependent on husband with answers and decisions at times.  Patient reports being able to feed self, dress and use the toilet by herself but requires assistance with other activities of daily living. She is dependent on husband with cooking/ preparing food, washing dishes, laundry, housekeeping, managing her medications and managing finances since she gets drowsy from medications and has balance issues. Patient's husband mentions "experimenting on Ativan to half of 1 mg dose given at bedtime only instead of 1 mg twice a day to prevent drowsiness". Strongly encouraged patient/ husband to notify provider regarding medication adjustments being done because of the effects it could cause to the patient. He states of plan to call provider today.  Patient's blood sugar is being checked every 1- 2 hours to monitor results of sliding scale insulin given- per husband's report. Patient's husband states administering patient's insulin. Patient's husband reports carb counting being done to comply with patient's diabetic diet. Husband mentions that patient is on 2 liter fluid restriction.  Transportation to providers' follow-up appointments provided by husband. Patient has a scheduled appointment to follow-up with primary care provider on 4/26 and a repeat EEG on  4/10. Patient has a scheduled Neuro PT evaluation on 4/18. Patient had expressed desire to have Advanced Directives in place. Patient's husband shared he has a scheduled surgery on 4/13. Referral to [facility] worker made for these needs.   Patient and husband have no other immediate needs at this time. Patient/ husband agreed to home visit early next week. Encouraged to call [facility], care management coordinator or 24-hour nurse line as needed. Contact information provided.   Plan: Initial home visit on 05/24/15. Referral to [facility] worker for assistance with Advanced Directive. Will notify primary care provider's office of initial findings.   Tyreke Kaeser A. Brendt Dible, BSN, RN-BC [place] [facility] Management Coordinator Cell: [phone]

## 2015-05-18 ENCOUNTER — Ambulatory Visit: Payer: Medicare Other | Admitting: Physical Therapy

## 2015-05-20 NOTE — Telephone Encounter (Addendum)
Pt's husband called sts he has called Dr Ysidro Evert and requested the Abilify to be discontinue. Pt's tremors have gotten worse today. He said she is very fatigued and exhausted while being on the depakote. He is inquiring if there is another medication she could take. He doesn't want it to be as sedating. Please call at [phone].

## 2015-05-20 NOTE — Telephone Encounter (Signed)
There is unfortunately no seizure medication that may not cause sedation/sleepiness; if she is indeed tapering off of the abilify, her daytime grogginess may improve and as she gets used to the depakote, the sleepiness may further improve. Pls advise husband.

## 2015-05-23 ENCOUNTER — Ambulatory Visit (INDEPENDENT_AMBULATORY_CARE_PROVIDER_SITE_OTHER): Payer: Medicare Other | Admitting: Neurology

## 2015-05-23 ENCOUNTER — Encounter: Payer: Self-pay | Admitting: *Deleted

## 2015-05-23 ENCOUNTER — Other Ambulatory Visit: Payer: Self-pay | Admitting: *Deleted

## 2015-05-23 DIAGNOSIS — R479 Unspecified speech disturbances: Secondary | ICD-10-CM

## 2015-05-23 DIAGNOSIS — G40909 Epilepsy, unspecified, not intractable, without status epilepticus: Secondary | ICD-10-CM

## 2015-05-23 DIAGNOSIS — R2689 Other abnormalities of gait and mobility: Secondary | ICD-10-CM

## 2015-05-23 DIAGNOSIS — G2111 Neuroleptic induced parkinsonism: Secondary | ICD-10-CM

## 2015-05-23 DIAGNOSIS — Z9289 Personal history of other medical treatment: Secondary | ICD-10-CM

## 2015-05-23 NOTE — Telephone Encounter (Signed)
Dr Brett Fairy- Please advise, thank you

## 2015-05-23 NOTE — Procedures (Signed)
    History:  Leslie Mccann is a 70 year old patient with a history of parkinsonism, and a memory disturbance. The patient has history of an event with word finding problems. An EEG study done on 04/28/2015 suggested abnormalities within the left frontal area. The patient is being treated with Depakote for presumed seizures. The patient is being reevaluated for this issue.  This is a routine EEG. No skull defects are noted. Medications include Abilify, aspirin, Depakote, Cymbalta, insulin, Synthroid, Ativan, Evista, Ultram, and vitamin D.   EEG classification: Normal awake  Description of the recording: The background rhythms of this recording consists of a fairly well modulated medium amplitude alpha rhythm of 8 Hz that is reactive to eye opening and closure. As the record progresses, the patient appears to remain in the waking state throughout the recording. Photic stimulation was performed, resulting in a bilateral and symmetric photic driving response. Hyperventilation was also performed, resulting in a minimal buildup of the background rhythm activities without significant slowing seen. At no time during the recording does there appear to be evidence of spike or spike wave discharges or evidence of focal slowing. EKG monitor shows no evidence of cardiac rhythm abnormalities with a heart rate of 78.  Impression: This is a normal EEG recording in the waking state. No evidence of ictal or interictal discharges are seen.

## 2015-05-23 NOTE — Telephone Encounter (Signed)
Called and spoke to husband.  She saw Dr Rexene Alberts on 3/21. Then saw Dr Ysidro Evert. Dropped abilify to 1/2 the dose. Been about 20 days since she has been taking 1/2 the dose. He is waiting to hear from Dr Ysidro Evert on whether she can quit the medication.  I relayed Dr Rexene Alberts message below. He understands but would like to know normal dosing for seizure medication. Advised Dr Rexene Alberts off this week and will be back next week. He is aware. He would like a call from the covering provider about possibly switching to a different medication. Advised I will send message. He understands.

## 2015-05-23 NOTE — Patient Outreach (Signed)
[place] [facility]) Care Management  05/23/2015  NURI BRANCA [birth year] [id number]   CSW was able to make initial contact with patient's husband, Nehemiah Massed. today to perform the phone assessment on patient, as well as assess and assist with social work needs and services.  CSW introduced self, explained role and types of services provided through [place] [facility] ([place] [facility]).  CSW further explained to Mr. Marily Memos that CSW works with patient's RNCM, also with [facility] Care Management, Dannielle Huh. CSW then explained the reason for the call, indicating that Mrs. Ajel thought that patient would benefit from social work services and resources to assist with arranging private agency sitter services.  CSW obtained two HIPAA compliant identifiers from Mr. Marily Memos, which included patient's name and date of birth. Mr. Marily Memos reports that he has cancelled his surgery scheduled for April 13th, due to not having anyone available to care for patient in the home during his procedure and while recovering.  Mr. Marily Memos stated, "I feel like a huge wait has been lifted, just from making that one decision".  Mr. Marily Memos went on to say that his surgery is not urgent or life threatening and that it can wait until he has secured around the clock care for patient.  Mr. Marily Memos admits that patient is "a very brittle diabetic" and that she needs to be monitored 24/7.  Mr. Marily Memos is in the process of talking with various family members, as well as home health agencies, to see about hiring a registered nurse to care for patient, during his time of surgery. CSW agreed to mail Mr. Marily Memos a packet of resource information, including all of the following: Economist for [place] (Living Will and [place] documents) Caregiver Resources  Support Programs to Avoid Caregiver Burnout Mr.  Marily Memos is aware that [employer], patient's insurance carrier, does not provide coverage for home health nursing services.  After completing a Financial Assessment Form with Mr. Marily Memos, it was also determined that patient will not be eligible to receive Adult Medicaid coverage based on monthly Social Security Income.  This eliminates patient being eligible for [place] through [place] through the [place].  Mr. Marily Memos reported that he will begin making some calls, as soon as the packet of resource information is received, as well as put money aside, so that he can afford to pay a private duty nurse to care for his wife. Mr. Marily Memos declined having CSW assist him and patient with completion of their Advanced Directives, reporting, "We've got too much going on right now to even think about taking on something else".  CSW agreed to assist with completion, when ready.  Mr. Marily Memos also declined having CSW schedule a home visit to meet with he and his wife to discuss other available social work services and resources.  Mr. Marily Memos took down CSW's contact information, agreeing to contact CSW directly if social work needs arise in the near future. CSW will perform a case closure on patient, as all goals of treatment have been met from social work standpoint and no additional social work needs have been identified at this time. CSW will notify patient's RNCM with [place] [facility], Dannielle Huh of CSW's plans to close patient's case. CSW will fax a correspondence letter to patient's Primary Care Physician, Dr. Reynold Bowen to ensure  that Dr. Forde Dandy is aware of CSW's case closure plans.   CSW will submit a case closure request to Lurline Del, Care Management Assistant with [place] [employer], in the form of an [employer].  CSW will ensure that Mrs. Laurance Flatten is  aware of Rod Can, RNCM with [facility], continued involvement with patient's care. Nat Christen, BSW, MSW, LCSW  Licensed Education officer, environmental Health System  Mailing [place] [address], [place], [place] [address] Physical Address-[address], [place], [place] [address] Toll Free Main # [phone] Fax # [phone] Cell # [phone]  Fax # [phone]  [email or web address]  Patient's preferred language:  [place]   English  ATTENTION:  If you speak [place], language assistance services, free of charge, are available to you.    Nondiscrimination and Accessibility Statement: Discrimination is Against the [employer], a subsidiary of [employer], complies with [employer] civil rights laws and does not discriminate on the basis of race, color, national origin, age, disability, or sex.  [place] does not exclude people or treat them differently because of race, color, national origin, age, disability, or sex.  [place] Providers will:  . Provide free aids and services to people with disabilities to communicate effectively with [place], such as:     ? Qualified sign language interpreters  ? Written information in other formats (large print, audio, accessible electronic formats, other formats)   . Provide free language services to people whose primary language is not [place], such as:    ? Qualified interpreters    ? Information written in other languages   If you need these services, contact your [facility] psychologist.  If you believe that a [facility] [employer] has failed to provide these services or discriminated in another way on the basis of race, color, national origin, age, disability, or sex, you can file a Tourist information centre manager with: [place],  [phone] or [email or web address].info/.  You can file a grievance in person or by mail, fax, or email. If you need help filing a grievance, you may contact Valrie Hart, Interim Compliance Officer, [facility] Department of Compliance and Integrity, [place]nd Floor, [place], [place]. [place], [phone], [email or web address].    You can also file a civil rights complaint with the U.S. Department of Health and Financial controller, Office for [employer], electronically through the Office for Civil Rights Complaint Portal, available at OnSiteLending.nl.jsf, or by mail or phone at:  [place]. Department of Health and Human Services [address], [place] Room [phone], [facility] Building [place], [place]  [phone], [phone] (TDD) Complaint forms are available at CutFunds.si.

## 2015-05-24 ENCOUNTER — Other Ambulatory Visit: Payer: Self-pay | Admitting: *Deleted

## 2015-05-24 ENCOUNTER — Encounter: Payer: Self-pay | Admitting: *Deleted

## 2015-05-24 ENCOUNTER — Ambulatory Visit: Payer: Self-pay | Admitting: *Deleted

## 2015-05-24 NOTE — Addendum Note (Signed)
Addended by: Larey Seat on: 05/24/2015 08:36 AM   Modules accepted: Medications

## 2015-05-24 NOTE — Patient Outreach (Signed)
[facility]) Care Management  05/24/2015  NANDA CZAP 1945-09-25 [id number]   Assessment: Care coordination call Call received from case management assistant Markham Jordan) while driving to patient's home address stating that patient's husband Jeneen Rinks) had called over to [facility] office several times to cancel appointment for 1:30 pm today.   Call placed to speak with patient's husband [name]) to confirm cancellation of initial home visit appointment  today and reason for cancellation. Patient's husband states they are "getting ready to go out of the door to go somewhere". Patient's husband was notified that care management coordinator is driving on the way to their house at that moment for the scheduled home visit, but he seemed anxious stating " I have called to cancel this, we are going out to a prior appointment". Care management coordinator asked patient's husband of another date to reschedule this initial home visit, however, he states that he had also called to cancel [facility] services and home visit all together. He denies needing [facility] services for now and states "will call you back if your services are needed in the future.  Husband states he will be calling to talk to patient's doctor about not needing the services.  Patient requests to cancel and agreed to close the case. Husband was notified that primary care provider will be notified of such.   Call placed to primary care provider's office (spoke with DJ) and notified of Ms. Rosol's husband's decision to cancel scheduled home visit and [facility] services all together. Update was given to her and mentioned of [facility] social worker's involvement in providing assistance to patient and husband on private agency sitters with nursing services that can provide care 24 hours a day when needed (husband had decided to cancel his upcoming surgery) as well as caregiver resources to prevent burn out and the Advanced Directive informations.   Primary provider's office staff (DJ) was very thankful for the help provided and states she will notify Dr. Forde Dandy of the updates regarding this patient.  She was also made aware and agreed with case closure for now.   Plan: Will close case since patient's husband chose to discontinue [facility] services. Primary care provider notified of case closure (per DJ).    Donnamarie Shankles A. Jerusha Reising, BSN, RN-BC [place] Management Coordinator Cell: [phone]

## 2015-05-24 NOTE — Telephone Encounter (Signed)
I called the patient. The EEG is normal. The EEG in the hospital was not normal, left frontal irritability, the patient should be on medications. Apparently Dr. Roddie Mc his discussed the possibility of Lamictal which may be reasonable. The patient is on Depakote, getting on Lamictal is therefore going to be a slow process.

## 2015-05-24 NOTE — Telephone Encounter (Signed)
If depakote is used for mood stabilization or migraine prevention, 500 MG once at night is the usual dose.

## 2015-05-24 NOTE — Telephone Encounter (Signed)
Called husband- 100 mg was reduced to 500 mg at night ER , not likely enough to cover seizure activity. Discussed change to Lamictal and possible VIMPAT. Lamictal change will take 6 weeks, Vimpat with sedate her- Await EEG reading and will ask Dr Rexene Alberts to arrange for these changes after her return. CD

## 2015-05-24 NOTE — Telephone Encounter (Signed)
Dr. Jannifer Franklin , please call patient  with EEG results.

## 2015-05-24 NOTE — Telephone Encounter (Signed)
The reduction in abilify has not let to improvement in alertness, wakefulness according to messages left with dr Rexene Alberts. I was able to leave a VM message but not details on the couples answering machine. My recommendation is to increase depakote . 500 mg bid is low dose, 73 50 mg bid would be used to treat a seizure disorder.  I expect a call back, prefer if the [facility] used my chart CD

## 2015-05-25 NOTE — Patient Outreach (Signed)
[facility]) Care Management  05/25/2015  HAVEN KEEZER 28-Mar-1945 [id number]   Request received from Nat Christen, LCSW to mail patient private agency resources, Advance Directives information and caregiver support resources. Information mailed today, 05/25/15.

## 2015-05-26 MED ORDER — LEVETIRACETAM 500 MG PO TABS: ORAL_TABLET | ORAL | Status: DC

## 2015-05-26 NOTE — Telephone Encounter (Signed)
I called patient, talk to the husband. The patient is not able to tolerate the Depakote. We will switch to Keppra, taking one half of a 500 mg tablet twice daily for one week then go to 1 full tablet twice daily. The patient will stop the Depakote when she goes on a full tablet of the Keppra. I have warned him that it may be increased side effects when the 2 medications are added together. A prescription was called in.

## 2015-05-26 NOTE — Addendum Note (Signed)
Addended by: Margette Fast on: 05/26/2015 11:19 AM   Modules accepted: Orders

## 2015-05-26 NOTE — Telephone Encounter (Signed)
Patient's husband is calling back stating the patient is having side effects from divalproex (DEPAKOTE ER) 500 MG 24 hr tablet. He states the medication is too strong and the patient is having fatigue, is groggy, constipated and it is affecting her mental alertness. He also wants to discuss the patient changing to Lamictal or should she just stop taking the medication. Please call and discuss. His cell# is [phone].

## 2015-05-30 ENCOUNTER — Telehealth: Payer: Self-pay | Admitting: *Deleted

## 2015-05-30 ENCOUNTER — Telehealth: Payer: Self-pay | Admitting: Neurology

## 2015-05-30 NOTE — Telephone Encounter (Signed)
Spouse called regarding levETIRAcetam (KEPPRA) 500 MG tablet, hasn't started this medication yet. Tremor has gotten worse and worse and thinks divalproex (DEPAKOTE ER) 500 MG 24 hr tablet is causing the tremors. Not sure if they should start the [place] or if Dr. Rexene Alberts wants to prescribe the other recommended medication (Lamictal) that has less side effects but takes longer to get into your system.

## 2015-05-30 NOTE — Telephone Encounter (Signed)
-----   Message from Star Age, MD sent at 05/30/2015  8:03 AM EDT ----- Please call and advise the patient that the EEG or brain wave test we performed was reported as normal in the awake state. We checked for abnormal electrical discharges in the brain waves and the report suggested normal findings. No further action is required on this test at this time. Please remind patient to keep any upcoming appointments or tests and to call us with any interim questions, concerns, problems or updates. Thanks,  Star Age, MD, PhD

## 2015-05-30 NOTE — Progress Notes (Signed)

## 2015-05-30 NOTE — Telephone Encounter (Signed)
Called and spoke to husband. He stated he was waiting for Dr Rexene Alberts to come back from vacation. He wanted Dr Guadelupe Sabin opinion. They talked with Dr Jannifer Franklin on 05/26/15. She is having continuing grogginess and other problems with Depakote. Tremor has gotten worse. He is wondering what next steps would be to help with this. Dr Jannifer Franklin was going to start her on Keppra. Talked to him about starting another medication but Dr Jannifer Franklin advised that it takes 6 weeks to get into her system.  He is concerned that her tremor got worse about a week or so ago. He wanted an earlier f/u. He requested this week if possible. Made f/u tomorrow (05/31/15) at 1030am, check in 1015am. He stated he will just go over everything with Dr Rexene Alberts tomorrow in the office.   I also went over EEG results per Dr Rexene Alberts note below. He verbalized understanding.

## 2015-05-31 ENCOUNTER — Encounter: Payer: Self-pay | Admitting: Neurology

## 2015-05-31 ENCOUNTER — Ambulatory Visit (INDEPENDENT_AMBULATORY_CARE_PROVIDER_SITE_OTHER): Payer: Medicare Other | Admitting: Neurology

## 2015-05-31 ENCOUNTER — Ambulatory Visit: Payer: Medicare Other | Admitting: Physical Therapy

## 2015-05-31 VITALS — BP 122/60 | HR 70 | Resp 14 | Ht 66.0 in | Wt 128.0 lb

## 2015-05-31 DIAGNOSIS — G40909 Epilepsy, unspecified, not intractable, without status epilepticus: Secondary | ICD-10-CM

## 2015-05-31 DIAGNOSIS — G2111 Neuroleptic induced parkinsonism: Secondary | ICD-10-CM | POA: Diagnosis not present

## 2015-05-31 DIAGNOSIS — R2689 Other abnormalities of gait and mobility: Secondary | ICD-10-CM

## 2015-05-31 NOTE — Progress Notes (Signed)
Subjective:    Patient ID: Leslie Mccann is a 70 y.o. female.  HPI     Interim history:   Leslie Mccann is a 70 year old right-handed woman with an underlying medical history of depression, hypothyroidism, osteopenia, prior diagnosis of OSA, not using CPAP, using nocturnal oxygen, hyperlipidemia, anxiety, diabetes with neuropathy and retinopathy, who presents for follow-up consultation of her trembling and suspected seizure disorder. She presents for a sooner than scheduled appointment because of increased lethargy on Depakote long-acting. She was last seen by me on 05/03/2015, at which time she was advised to continue with Depakote. Her husband called multiple times in the interim with increase in lethargy and tremors. She was switched to Depakote long-acting but also could not tolerate it. She had an EEG on 05/23/2015 which showed: Impression: This is a normal EEG recording in the waking state. No evidence of ictal or interictal discharges are seen. At the time of her last visit I suggested they continue follow-up with psychiatry and discuss tapering off of Abilify for concern for drug-induced parkinsonism.  Today, 05/31/2015: She reports no new symptoms. Her husband provides most of her history. He says that her tremors in her hands have become worse. They do fluctuate. She seems to still be quite sedated from medication. She has been off of Abilify. She has been off of it for the past 2 weeks or so. Her husband was advised in the interim to stop Depakote and start a trial of Keppra. He has not started it yet for fear of side effects.  Previously:  05/03/2015: She reports that she had a 2 day spell of word finding difficulty.   I reviewed your office note from 04/27/2015. A1c was 8.7 at the time. Blood work from 04/27/2015 showed blood sugar level of 321, creatinine 0.7, CMP unremarkable, total cholesterol 247, LDL 139, TSH 0.82, free T4 normal, vitamin D low normal at 31.9.  Of  note, she then  presented to the emergency room from your office on 04/27/2015 with altered mental status and memory loss as well as trouble speaking. I reviewed the emergency room records. She was admitted to the hospital for TIA or stroke workup. I reviewed the hospital records including neurological consultation note, EEG, discharge summary, MRI and CT head results. RPR nonreactive, normal ammonia, negative blood on call level.  She had an echocardiogram on 04/29/2015: Impressions:   - Normal LV systolic function; grade 1 diastolic dysfunction;   calcified aortic valve with no AS; mildly dilated aortic root;   atrial septal aneurysm. Of note, she had an EEG while inpatient and I reviewed the EEG report from 04/28/2015:  Impression: Abnormal routine inpatient EEG suggestive of underlying neuronal dysfunction with epileptogenic potential in the left frontal region as described. No electrographic seizures were seen during this recording Clinical correlation is recommended. Based on her test results and her clinical presentation, she was suspected to have underlying seizures. She was started on Depakote 500 mg bid.  Head CT without contrast on 04/27/2015 showed: IMPRESSION: Chronic atrophic and ischemic changes without acute abnormality.    In addition, personally reviewed the images through the PACS system. She had a brain MRI without contrast and head MRA without contrast on 04/28/2015: IMPRESSION: 1.  No acute intracranial abnormality. 2. 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 3. Negative intracranial MRA aside from atherosclerotic and stenotic right PCA P1 segment (right thalamostriate artery region). Fortunately, there is a fetal type  right PCA origin such that the more distal right PCA branches remain normal.   In addition, personally reviewed the images through the PACS system. Of note, she also had a prior brain MRI with and without contrast on  10/05/2013: IMPRESSION: 1.  No acute intracranial abnormality. 2. Mild for age nonspecific signal changes in the brain, most commonly due to chronic small vessel disease.  Her husband reports, she has had difficulty with blood sugar controls, highs and lows, working on new regimen. She has been on Abilify for years. Has tremors, which are getting worse, husband feels, VPA is making her groggy. She has fallen in the past. He has noted changes in the gait. She has LBP. Tremor is intermittent, R more than L hand.    I saw her on 09/26/2012 for a gait disorder and recurrent falls. She was at the emergency room after a fall and had workup in the form of chest x-ray, and head CT on 09/21/2012 without contrast which showed no intracranial acute pathology of mild cortical volume loss and scattered small vessel ischemic microangiopathy. UDS was negative at the time. She also reported a fall on 08/02/2012. She reported some memory loss at the time. She also reported a hand tremor and had some medication changes in her psychiatric medications including discontinuation of Prozac, she was also taken off of Cogentin and quit taking Wellbutrin. She did not return for follow-up. In the interim, she was seen by Dr. Brett Fairy for a suspected TIA or alteration in consciousness level, she was seen on 10/16/2013, I reviewed the note. EEG was reported as normal on 11/13/2013.   09/26/2012: Leslie Mccann is a 70 year old right-handed woman with an underlying medical history of depression, anxiety, diabetes and retinopathy, who was recently at the emergency room after a fall. She was noted to have altered mental status. She was seen on 09/21/2012 and had workup including a chest x-ray which was negative for any fracture or cardiopulmonary disease. She had a head CT without contrast which showed no acute intracranial pathology and mild cortical volume loss and scattered small vessel ischemic microangiopathy. She had a UDS which was  negative. She has recently had some changes in her psychiatric medications and may have not taken some of her medication correctly.   She also had fallen on 08/02/2012 and went to the emergency room after hitting her head falling backwards. She had a head CT and cervical spine CT and I reviewed the reports:  Mild atrophy and chronic microvascular ischemic change. No acute intracranial findings. No skull fracture.   CT CERVICAL SPINE: There is severe disc space narrowing at C4-5, C5-C6, and moderate disc space narrowing at C6-7. There is facet mediated slip of 2-3 mm at C3-C4. There is no cervical spine fracture or traumatic subluxation. No intraspinal hematoma or mass. No prevertebral soft tissue swelling. Carotid calcification. No lung apex lesion.   Of note, she is on Abilify, baby aspirin, vitamin B complex, Cymbalta, ibuprofen, Lantus insulin, Humalog insulin, Synthroid, Ativan, Evista, vitamin D.   She has had memory loss for the past.    She has had a hand tremor of about a year. She was taken off of Prozac and was changed her to Brintellix about a month ago, but this was stopped 3 weeks ago. She was taken off Cogentin a few days ago and quit Wellbutrin a couple of days ago. She has been drinking a glas of wine each night, but denies heavy drinking in the past and quit  smoking in 2000. She has had some high CBS values. A few days ago she has had a blood sugar of 600 per husband.    Her Past Medical History Is Significant For: Past Medical History  Diagnosis Date  . Diabetes mellitus   . Depression   . Anxiety   . Retinopathy   . TIA (transient ischemic attack) 10/16/2013  . Diabetic retinopathy ([place]) 10/16/2013  . Snorings 10/16/2013  . 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 Asthma     childhood  . Tremors of nervous system     Her Past Surgical History Is Significant For: Past Surgical History  Procedure Laterality Date  . Eye surgeries     . Cataract extraction, bilateral    . Colonoscopy       Her Family History Is Significant For: Family History  Problem Relation Age of Onset  . Cancer Mother   . COPD Father   . Diabetes Father   . Hypertension Maternal Grandmother   . Stroke Maternal Grandmother   . Stroke Maternal Grandfather   . Cancer Maternal Grandfather   . Hypothyroidism Daughter     Her Social History Is Significant For: Social History   Social History  . Marital Status: Married    Spouse Name: [name]  . Number of Children: 1  . Years of Education: [employer]   Social History Main Topics  . Smoking status: Former Smoker -- 1.00 packs/day for 10 years    Types: Cigarettes    Quit date: 02/12/1998  . Smokeless tobacco: Never Used  . Alcohol Use: 0.0 oz/week    0 Standard drinks or equivalent per week     Comment: occassional   . Drug Use: No  . Sexual Activity: Not Asked   Other Topics Concern  . None   Social History Narrative   Patient is married [name]) and lives at home with her husband.   Patient has one adult child.   Patient has a college education.   Patient is right-handed.   Patient drinks 1-2 cups of coffee daily.    Her Allergies Are:  Allergies  Allergen Reactions  . Codeine Nausea And Vomiting  . Sulfa Antibiotics Nausea And Vomiting  . Adhesive [Tape] Rash  :   Her Current Medications Are:  Outpatient Encounter Prescriptions as of 05/31/2015  Medication Sig  . aspirin 325 MG tablet Take 1 tablet (325 mg total) by mouth daily.  . divalproex (DEPAKOTE ER) 500 MG 24 hr tablet Take 1 tablet (500 mg total) by mouth at bedtime.  . DULoxetine (CYMBALTA) 20 MG capsule Take 40 mg by mouth daily.   Marland Kitchen GLUCAGON EMERGENCY 1 MG injection   . Insulin Degludec (TRESIBA FLEXTOUCH [place]) Inject into the skin.  . Insulin Degludec (TRESIBA FLEXTOUCH) 100 UNIT/ML SOPN Inject into the skin.  Marland Kitchen insulin lispro (HUMALOG) 100 UNIT/ML injection Inject 4-8 Units into the skin 3 (three) times daily before meals.  Marland Kitchen levothyroxine  (SYNTHROID, LEVOTHROID) 100 MCG tablet Take 100 mcg by mouth every morning.   Marland Kitchen LORazepam (ATIVAN) 0.5 MG tablet Take 0.5 mg by mouth 2 (two) times daily.  . Misc. Devices (FINGERTIP PULSE OXIMETER) MISC Use as directed  . Misc. Devices (PULSE OXIMETER) MISC 1 Device by Does not apply route as directed.  . ONE TOUCH ULTRA TEST test strip   . raloxifene (EVISTA) 60 MG tablet Take 60 mg by mouth every evening.   . Vitamin D, Ergocalciferol, (DRISDOL) 50000 UNITS CAPS capsule Take 50,000 Units  by mouth every 7 (seven) days.   . [DISCONTINUED] insulin glargine (LANTUS) 100 UNIT/ML injection Inject 0.08 mLs (8 Units total) into the skin at bedtime.  . [DISCONTINUED] traMADol (ULTRAM) 50 MG tablet Take 50 mg by mouth every 6 (six) hours as needed for moderate pain or severe pain. Reported on 05/16/2015  . levETIRAcetam (KEPPRA) 500 MG tablet 1/2 tablet twice a day for 1 week, then take 1 tablet twice daily (Patient not taking: Reported on 05/31/2015)  . [DISCONTINUED] ARIPiprazole (ABILIFY) 2 MG tablet Take 2 mg by mouth every evening.   . [DISCONTINUED] divalproex (DEPAKOTE) 500 MG DR tablet Take 500 mg by mouth every 12 (twelve) hours.  . [DISCONTINUED] LORazepam (ATIVAN) 1 MG tablet Take 1 mg by mouth 2 (two) times daily.    No facility-administered encounter medications on file as of 05/31/2015.  :  Review of Systems:  Out of a complete 14 point review of systems, all are reviewed and negative with the exception of these symptoms as listed below:   Review of Systems  Neurological:       Husband states that they called in the week Dr. Rexene Alberts was out of the office. Spoke to Dr. Brett Fairy and Dr. Jannifer Franklin about the side effects of Depakote. Husband reports that patient has brain fog, increased depression, constipation, and increased tremors. Dr. Jannifer Franklin prescribed Keppra but have not started it yet, they would like to know Dr. Guadelupe Sabin opinion.     Objective:  Neurologic Exam  Physical Exam Physical  Examination:   Filed Vitals:   05/31/15 1058  BP: 122/60  Pulse: 70  Resp: 14    General Examination: The patient is a very pleasant 70 y.o. female in no acute distress. She appears less frail and deconditioned. Her history is Primarily provided by her husband, she is not very verbal, appears less sleepy today, mildly anxious.  HEENT: Normocephalic, atraumatic, pupils are equal, round and reactive to light and accommodation. Extraocular tracking is good without limitation to gaze excursion or nystagmus noted. Normal smooth pursuit is noted. Hearing is grossly intact. Face is symmetric with mild facial masking, and normal facial sensation. She has a mild decrease in eye blink rate. Speech is clear with no dysarthria noted. There is mild hypophonia. There is a slight intermittent lower lip tremor. Neck is very mildly rigid with full range of passive and active motion. There are no carotid bruits on auscultation. Oropharynx exam reveals: moderate mouth dryness, adequate dental hygiene and no significant airway crowding. Mallampati is class II. Tongue protrudes centrally and palate elevates symmetrically.  Chest: Clear to auscultation without wheezing, rhonchi or crackles noted.  Heart: S1+S2+0, regular and normal without murmurs, rubs or gallops noted.   Abdomen: Soft, non-tender and non-distended with normal bowel sounds appreciated on auscultation.  Extremities: There is no pitting edema in the distal lower extremities bilaterally. Pedal pulses are intact.  Skin: Warm and dry without trophic changes noted. There are no varicose veins.  Musculoskeletal: exam reveals no obvious joint deformities, tenderness or joint swelling or erythema.   Neurologically:  Mental status: The patient is awake, alert and oriented in all 4 spheres. Her memory, attention, language and knowledge are mildly impaired (MMSE in 2014 was 26/30, CDT 4/4, AFT 17). There is no aphasia, agnosia, apraxia or anomia. Speech is  clear with mild hypophonia noted. She appears to have mild slowness in thinking. Thought process is linear. Affect is constricted. She displays mild  Cranial nerves are as described above under HEENT  exam. In addition, shoulder shrug is normal with equal shoulder height noted. Motor exam: Normal bulk, strength and tone is noted. There is no drift, or rebound. Romberg is negative. Reflexes are 1+ throughout. Fine motor skills are mildly impaired throughout in both upper and lower extremities, she has a very slight intermittent resting tremor in the right thumb only. She has no significant action tremor, but a mild postural tremor, left more than right in the upper extremities. Cerebellar testing shows no dysmetria or intention tremor on finger to nose testing. Heel to shin is unremarkable bilaterally. There is no truncal or gait ataxia.  Sensory exam is intact to light touch.  Gait, station and balance: She stands up slowly and pushes herself up. Posture is mildly stooped, she has a increase in lumbar kyphosis. She walks slowly with decreased stride length and pace and decreased arm swing bilaterally. She walks cautiously. Tandem walk is not possible for her.  Assessment and Plan:   In summary, Leslie Mccann is a very pleasant 70 year old female with a history of depression and anxiety, hypothyroidism, and difficult to control diabetes, who presents for a sooner than scheduled follow-up appointment secondary to medication side effects with Depakote ER. I last saw her and March 2017 at which time I suggested she continue with Depakote. In the interim, we reduced the Depakote ER to just once nightly because of increasing lethargy reported per husband. We also did an EEG in the office which was actually normal. We talked to them on the phone about the results. In the interim, last week, the patient's husband called with ongoing issues with tremulousness in her hands and lethargy with Depakote. He talk to the  on-call physician. They were encouraged to switch to Keppra. He filled the prescription but has not started it yet. They were supposed to start Keppra 500 mg half a pill twice daily for a week with overlap with Depakote ER for a week and then stop the Depakote and then increase the Keppra to 1 whole pill twice daily thereafter. We talked about recent test results, we talked about the differential diagnosis of TIA versus other speech impairment versus tremors versus seizures quite a bit today. In the interim, she has been taken off of Abilify through her psychiatrist. I was worried about secondary mild parkinsonism causing slowness, trembling and a gait disorder in her case. They have a trip planned out of town for the weekend. I suggested we initiate the new medication when she is back. She is worried about side effects which is understandable. I explained to the patient and her husband at length today that unfortunately no medication is without potential side effects and sometimes we have to go through a period of trial and error. At this juncture, she will overlap Depakote and Keppra for 3 days when they are back in town and then discontinue the Depakote and continue Keppra 250 mg twice daily for another 4 days and increase Keppra to 500 mg twice daily thereafter. They are encouraged to call with any interim questions or concerns. She has an appointment already scheduled for June with me which I asked him to keep. Physical exam is fairly stable. I have previously reviewed her extensive hospital records and talked to the patient and her husband about her test results. We again talked about seizure precautions including no driving for 6 months. Her situation is complicated as she is on multiple psychotropic medications, and she has had chronic difficulty with blood sugar control.  We will monitor her symptoms and physical exam. She is advised to stay better hydrated, to ensure good nutrition and change positions  slowly. I answered all their questions today and the patient and her husband were in agreement. I spent 25 minutes in total face-to-face time with the patient, more than 50% of which was spent in counseling and coordination of care, reviewing test results, reviewing medications.

## 2015-05-31 NOTE — Telephone Encounter (Signed)
Patient is here for office visit to discuss medications.

## 2015-05-31 NOTE — Patient Instructions (Signed)
You can continue the Depakote ER 500 mg at night for 3 more days, while overlapping with the new medication, Keppra, then stop Depakote on 06/08/15. You can start the [place] on 06/06/15 with 1/2 pill 2 times a day for 1 week, then 1 pill 2 times a day thereafter.  You can keep the follow up in June with me.

## 2015-06-01 NOTE — Assessment & Plan Note (Signed)
Documented nocturnal hypoxemia, likely due to sedating meds and hypoventilation. She has not been using recently but plans to restart. This will be our plan. Continue 1L/min unless her medical regimen changes at which time we can reassess her needs.

## 2015-06-06 ENCOUNTER — Telehealth: Payer: Self-pay | Admitting: Neurology

## 2015-06-06 NOTE — Telephone Encounter (Signed)
I am honestly not sure what is going on, as explained during her last clinic visit, her situation is complicated. We may have to await test results and evaluation from her current hospitalization. But ultimately, we may have to have her evaluated by a larger neurological Center, such as teaching hospital, either [facility] or [facility].

## 2015-06-06 NOTE — Telephone Encounter (Signed)
Patient is in the hospital at [facility]. She started Keppra on Saturday and by Sunday afternoon she was unable to communicate. Unknown if from [place] or low sodium levels. Patient had EEG and are currently waiting for results. Husband has asked that the hospital staff hold the keppra for now.  Husband would like to know your opinion about this whole situation, if you think this is seizure or sodium related? If there was no seizure, ok to stop seizure medications all together? Also states that the attending neurologist is Dr. Resa Miner P# [phone], if you wish to speak with him. Husband states that you are also welcome to call him if you wish.

## 2015-06-06 NOTE — Telephone Encounter (Signed)
Patient's husband is calling and states his wife was admitted into the hospital in [place] [facility] yesterday she had some kind of spell.  Her sodium level is 121.  The hospital gave her an EEG and Keppra 2 X day. He would like to speak with Dr. Rexene Alberts.  Thanks!

## 2015-06-07 NOTE — Patient Outreach (Signed)
[facility]) Care Management  06/07/2015  Leslie Mccann 1945-10-25 [id number]   Request received from Nat Christen, LCSW to mail patient's daughter, at the request of patient, information about private agency resources. Information mailed today.

## 2015-06-07 NOTE — Telephone Encounter (Signed)
I called and spoke to husband. Gave him recommendations below. When patient is to be discharged he is going to ask the staff to fax [place] the hospital notes and test results.

## 2015-06-07 NOTE — Telephone Encounter (Signed)
Also doctor at hospital recommended that patient come off of cymbalta. Husband is trying to get in touch with psychiatrist for further instruction.

## 2015-06-08 ENCOUNTER — Ambulatory Visit: Payer: Medicare Other | Admitting: Physical Therapy

## 2015-06-09 ENCOUNTER — Ambulatory Visit: Payer: Medicare Other | Attending: Neurology | Admitting: Physical Therapy

## 2015-06-09 DIAGNOSIS — M6281 Muscle weakness (generalized): Secondary | ICD-10-CM

## 2015-06-09 DIAGNOSIS — R2689 Other abnormalities of gait and mobility: Secondary | ICD-10-CM | POA: Diagnosis not present

## 2015-06-09 DIAGNOSIS — R293 Abnormal posture: Secondary | ICD-10-CM | POA: Diagnosis present

## 2015-06-10 NOTE — Therapy (Signed)
[facility] [address] [place], [place], [address] Phone: [phone]   Fax:  [phone]  Physical Therapy Evaluation  Patient Details  Name: Leslie Mccann MRN: [id number] Date of Birth: Nov 01, 1945 Referring Provider: Erlinda Hong  Encounter Date: 06/09/2015      PT End of Session - 06/10/15 1455    Visit Number 1   Number of Visits 17   Date for PT Re-Evaluation 08/08/15   Authorization Type Medicare/Aetna-G-CODE every 10th visit   PT Start Time 1017   PT Stop Time 1108   PT Time Calculation (min) 51 min   Equipment Utilized During Treatment Gait belt   Activity Tolerance Patient tolerated treatment well   Behavior During Therapy [facility] for tasks assessed/performed      Past Medical History  Diagnosis Date  . Diabetes mellitus   . Depression   . Anxiety   . Retinopathy   . TIA (transient ischemic attack) 10/16/2013  . Diabetic retinopathy ([place]) 10/16/2013  . Snorings 10/16/2013  . 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 Asthma     childhood  . Tremors of nervous system     Past Surgical History  Procedure Laterality Date  . Eye surgeries     . Cataract extraction, bilateral    . Colonoscopy      There were no vitals filed for this visit.       Subjective Assessment - 06/09/15 1024    Subjective Pt is a 70 year old female who presents to OP PT status post hospitalization in [facility] due to low sodium, side effects from Depakote.  She was hospitalized for 3 days.  EEG in hospital and previous EEG with Dr. Rexene Alberts both showed no seizure activity.  Pt reports no falls in the past 6 months.  Husband notes gradual physical activity and strength decline in the past 2 1/2 months.   Patient is accompained by: Family member  Husband   Pertinent History Type I diabetes, spinal stenosis   Patient Stated Goals Pt's goal for therapy is to regain stable balance and to help with tremor.  Husband would like to help with  strenghtening and mobility.   Currently in Pain? Yes   Pain Score 8    Pain Location Back   Pain Orientation Lower   Pain Descriptors / Indicators Aching   Pain Type Chronic pain   Pain Onset More than a month ago   Pain Frequency Intermittent   Aggravating Factors  bending over, gettting up from sitting position   Pain Relieving Factors Lying down            [facility] PT Assessment - 06/09/15 1033    Assessment   Medical Diagnosis multifactorial gait disorder   Referring Provider Xu   Precautions   Precautions Fall  Brittle diabetic-per husband report   Balance Screen   Has the patient fallen in the past 6 months No   Has the patient had a decrease in activity level because of a fear of falling?  Yes   Is the patient reluctant to leave their home because of a fear of falling?  Yes   [place] Private residence   Living Arrangements Spouse/significant other   Available Help at Discharge Family   Type of [place] to enter   Entrance Stairs-Number of Steps 4   Entrance Stairs-Rails Right;Left;Cannot reach both   [place] level   [place] bars -  toilet;Grab bars - tub/shower   Prior Function   Level of Independence Needs assistance with ADLs;Independent with household mobility without device  Husband  assists with bathing and dressing   Comments Currently no formal exercise program, interested in starting ex program with husband   Posture/Postural Control   Posture/Postural Control Postural limitations   Postural Limitations Rounded Shoulders;Forward head  Curvature of spine, husband ? leg length difference   ROM / Strength   AROM / PROM / Strength Strength   Strength   Overall Strength Comments Grossly tested 4/5 bilateral lower extremities, with L ankle dorsiflexion 3+/5   Transfers   Transfers Sit to Stand;Stand to Sit   Sit to Stand 5: Supervision;Without upper extremity assist;From chair/3-in-1    Five time sit to stand comments  27.69 sec   Stand to Sit 5: Supervision;Without upper extremity assist;To chair/3-in-1   Comments Slowed sit<>stand transfers in general, with pt reporting she typically uses UE support for sit<>stand, especially with low surfaces   Ambulation/Gait   Ambulation/Gait Yes   Ambulation/Gait Assistance 5: Supervision   Ambulation Distance (Feet) 150 Feet   Assistive device None   Gait Pattern Step-through pattern;Decreased arm swing - right;Decreased step length - right;Decreased step length - left;Trunk rotated posteriorly on left;Wide base of support;Decreased trunk rotation   Ambulation Surface Level;Indoor   Gait Comments With DGI (see below), pt veers to R when negotiating obstacles   Standardized Balance Assessment   Standardized Balance Assessment Timed Up and Go Test;Dynamic Gait Index   Dynamic Gait Index   Level Surface Mild Impairment   Change in Gait Speed Mild Impairment   Gait with Horizontal Head Turns Mild Impairment   Gait with Vertical Head Turns Moderate Impairment   Gait and Pivot Turn Mild Impairment  3.06 sec   Step Over Obstacle Severe Impairment  veers to R with obstacles   Step Around Obstacles Moderate Impairment   Steps Moderate Impairment   Total Score 11   DGI comment: (Scores <19/24 are indicative of increased fall risk.)   Timed Up and Go Test   Normal TUG (seconds) 13.05   Manual TUG (seconds) 15.38   Cognitive TUG (seconds) 14.52                             PT Short Term Goals - 06/10/15 1506    PT SHORT TERM GOAL #1   Title Pt will perform HEP with family supervision, for improved balance, strength and mobility.  TARGET 07/09/15   Time 4   Period Weeks   Status New   PT SHORT TERM GOAL #2   Title Pt will improve TUG manual score to less than or equal to 14.5 seconds for improved balance/decreased fall risk.   Time 4   Period Weeks   Status New   PT SHORT TERM GOAL #3   Title GAit velocity  to be assessed, with goal to be written as appropriate.   Time 4   Period Weeks   Status New   PT SHORT TERM GOAL #4   Title Pt will perform at least 6 of 10 reps of sit<>stand with minimal to no UE support, for improved lower extremity strength and ease of transfers.   Time 4   Period Weeks   Status New           PT Long Term Goals - 06/10/15 1508    PT LONG TERM GOAL #1   Title  Pt will verbalize understanding of fall prevention in home environment.  TARGET 08/09/15   Time 8   Period Weeks   Status New   PT LONG TERM GOAL #2   Title Pt will improve 5x sit<>Stand to less than or equal to 20 seconds for improved efficiency and safety with transfers.   Time 8   Period Weeks   Status New   PT LONG TERM GOAL #3   Title Pt will improve Dynamic Gait Index score to at least 16/24 for decreased fall risk.   Time 8   Period Weeks   Status New   PT LONG TERM GOAL #4   Title Pt will ambulate at least 500 ft, indoor/outdoor surfaces, with appropriate assistive device, modified independently, for improved gait efficiency and safety.   Time 8   Period Weeks   Status New   PT LONG TERM GOAL #5   Title Pt will verbalize plans for continued community fitness upon D/C from PT.   Time 8   Period Weeks   Status New               Plan - 06/10/15 1456    Clinical Impression Statement Pt is a 70 year old female who presents to OP PT for moderate complexity evaluation.  Pt has had approximately 2 month period of functional decline due to history of seizure and medication-related side effects.  Pt presents with at least 7 co-morbidities per PMH.  Pt presents today with tremors, decreased strength, decreased independence with transfers, bradykinesia, decreased balance, decreased timing/coordination with gait, postural instabilty, abnormal posture.  Pt is at high risk of falls per DGI and TUG manual scores.  Pt demonstrates increased time for transfers in 5x sit<>stand test.  Pt would benefit  from skilled PT to address strength, balance, posture, gait for improved functional mobility, including playing with dogs and grandchildren, and for decreased fall risk.   Rehab Potential Good   PT Frequency 2x / week   PT Duration 8 weeks  plus eval   PT Treatment/Interventions ADLs/Self Care Home Management;Therapeutic exercise;Therapeutic activities;Functional mobility training;Gait training;Balance training;Neuromuscular re-education;Patient/family education   PT Next Visit Plan Assess gait velocity; check leg length and possibly try insert (question R shorter than L?); initiate HEP for strengthening and balance   Consulted and Agree with Plan of Care Patient;Family member/caregiver   Family Member Consulted Husband      Patient will benefit from skilled therapeutic intervention in order to improve the following deficits and impairments:  Abnormal gait, Decreased balance, Decreased mobility, Decreased strength, Difficulty walking, Postural dysfunction  Visit Diagnosis: Other abnormalities of gait and mobility  Abnormal posture  Muscle weakness (generalized)      G-Codes - 06-12-2015 1512    Functional Assessment Tool Used 5x sit<>stand:  27.69 sec, DGI 11/24, TUG maniual:  15.38 sec   Functional Limitation Mobility: Walking and moving around   Mobility: Walking and Moving Around Current Status [phone]) At least 40 percent but less than 60 percent impaired, limited or restricted   Mobility: Walking and Moving Around Goal Status [phone]) At least 20 percent but less than 40 percent impaired, limited or restricted       Problem List Patient Active Problem List   Diagnosis Date Noted  . Seizures ([place])   . Difficulty speaking 04/27/2015  . Insulin dependent diabetes mellitus ([place]) 04/27/2015  . Hyponatremia 04/27/2015  . Hypochloremia 04/27/2015  . Hypothyroidism 04/27/2015  . Anxiety 04/27/2015  . Acute encephalopathy 04/27/2015  .  Speech abnormality   . Nocturnal hypoxemia  12/17/2013  . TIA (transient ischemic attack) 10/16/2013  . Diabetic retinopathy ([place]) 10/16/2013  . Transient alteration of awareness 10/16/2013  . DM retinopathy ([place]) 10/16/2013  . Snorings 10/16/2013  . 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 06/10/2015, 3:14 PM  Frazier Butt., PT  [facility] [address] [place], [place], [address] Phone: [phone]   Fax:  [phone]  Name: Leslie Mccann MRN: [id number] Date of Birth: December 01, 1945

## 2015-06-15 NOTE — Telephone Encounter (Addendum)
Husband Jeneen Rinks called, advised our office has to put in request for "whole hospital stay, CT scan, EEG, MRI, labwork including ADH Hormone that was done "right at time of discharge", doctor's notes from [facility], [place], [place] Fax# [phone] Attn: Jamas Lav, also states sodium was 120 when went into ER. Husband also request result and copy of ADH Hormone result. Wants to also let Dr. Rexene Alberts know that patient is feeling much better after being off of DEPAKOTE.

## 2015-06-20 ENCOUNTER — Encounter (HOSPITAL_COMMUNITY): Payer: Self-pay | Admitting: Emergency Medicine

## 2015-06-20 ENCOUNTER — Inpatient Hospital Stay (HOSPITAL_COMMUNITY)
Admission: EM | Admit: 2015-06-20 | Discharge: 2015-06-23 | DRG: 645 | Disposition: A | Discharge status: Home Health | Payer: Medicare Other | Attending: Family Medicine | Admitting: Family Medicine

## 2015-06-20 DIAGNOSIS — E1065 Type 1 diabetes mellitus with hyperglycemia: Secondary | ICD-10-CM | POA: Diagnosis present

## 2015-06-20 DIAGNOSIS — E875 Hyperkalemia: Secondary | ICD-10-CM | POA: Diagnosis present

## 2015-06-20 DIAGNOSIS — G4734 Idiopathic sleep related nonobstructive alveolar hypoventilation: Secondary | ICD-10-CM | POA: Diagnosis present

## 2015-06-20 DIAGNOSIS — E860 Dehydration: Secondary | ICD-10-CM | POA: Diagnosis present

## 2015-06-20 DIAGNOSIS — E10319 Type 1 diabetes mellitus with unspecified diabetic retinopathy without macular edema: Secondary | ICD-10-CM | POA: Diagnosis present

## 2015-06-20 DIAGNOSIS — E119 Type 2 diabetes mellitus without complications: Secondary | ICD-10-CM

## 2015-06-20 DIAGNOSIS — Z794 Long term (current) use of insulin: Secondary | ICD-10-CM

## 2015-06-20 DIAGNOSIS — F329 Major depressive disorder, single episode, unspecified: Secondary | ICD-10-CM | POA: Diagnosis present

## 2015-06-20 DIAGNOSIS — R55 Syncope and collapse: Secondary | ICD-10-CM | POA: Diagnosis present

## 2015-06-20 DIAGNOSIS — I951 Orthostatic hypotension: Secondary | ICD-10-CM | POA: Diagnosis not present

## 2015-06-20 DIAGNOSIS — R569 Unspecified convulsions: Secondary | ICD-10-CM

## 2015-06-20 DIAGNOSIS — E871 Hypo-osmolality and hyponatremia: Secondary | ICD-10-CM | POA: Diagnosis not present

## 2015-06-20 DIAGNOSIS — E039 Hypothyroidism, unspecified: Secondary | ICD-10-CM | POA: Diagnosis present

## 2015-06-20 DIAGNOSIS — E1040 Type 1 diabetes mellitus with diabetic neuropathy, unspecified: Secondary | ICD-10-CM | POA: Diagnosis present

## 2015-06-20 DIAGNOSIS — E1039 Type 1 diabetes mellitus with other diabetic ophthalmic complication: Secondary | ICD-10-CM | POA: Diagnosis present

## 2015-06-20 DIAGNOSIS — E11319 Type 2 diabetes mellitus with unspecified diabetic retinopathy without macular edema: Secondary | ICD-10-CM | POA: Diagnosis present

## 2015-06-20 DIAGNOSIS — R0902 Hypoxemia: Secondary | ICD-10-CM | POA: Diagnosis present

## 2015-06-20 DIAGNOSIS — E222 Syndrome of inappropriate secretion of antidiuretic hormone: Principal | ICD-10-CM | POA: Diagnosis present

## 2015-06-20 DIAGNOSIS — E785 Hyperlipidemia, unspecified: Secondary | ICD-10-CM | POA: Diagnosis present

## 2015-06-20 DIAGNOSIS — F419 Anxiety disorder, unspecified: Secondary | ICD-10-CM | POA: Diagnosis present

## 2015-06-20 DIAGNOSIS — IMO0001 Reserved for inherently not codable concepts without codable children: Secondary | ICD-10-CM

## 2015-06-20 LAB — URINALYSIS, ROUTINE W REFLEX MICROSCOPIC
GLUCOSE, UA: NEGATIVE mg/dL
Hgb urine dipstick: NEGATIVE
Ketones, ur: >80 mg/dL — AB
LEUKOCYTES UA: NEGATIVE
NITRITE: NEGATIVE
PROTEIN: 30 mg/dL — AB
Specific Gravity, Urine: 1.021 (ref 1.005–1.030)
pH: 8.5 — ABNORMAL HIGH (ref 5.0–8.0)

## 2015-06-20 LAB — URINE MICROSCOPIC-ADD ON: RBC / HPF: NONE SEEN RBC/hpf (ref 0–5)

## 2015-06-20 LAB — BASIC METABOLIC PANEL
ANION GAP: 10 (ref 5–15)
BUN: 15 mg/dL (ref 6–20)
CO2: 23 mmol/L (ref 22–32)
Calcium: 8.6 mg/dL — ABNORMAL LOW (ref 8.9–10.3)
Chloride: 98 mmol/L — ABNORMAL LOW (ref 101–111)
Creatinine, Ser: 0.71 mg/dL (ref 0.44–1.00)
GLUCOSE: 158 mg/dL — AB (ref 65–99)
POTASSIUM: 5.3 mmol/L — AB (ref 3.5–5.1)
Sodium: 131 mmol/L — ABNORMAL LOW (ref 135–145)

## 2015-06-20 LAB — GLUCOSE, CAPILLARY: Glucose-Capillary: 232 mg/dL — ABNORMAL HIGH (ref 65–99)

## 2015-06-20 LAB — CBC WITH DIFFERENTIAL/PLATELET
BASOS ABS: 0 10*3/uL (ref 0.0–0.1)
Basophils Relative: 0 %
EOS PCT: 1 %
Eosinophils Absolute: 0 10*3/uL (ref 0.0–0.7)
HCT: 34.8 % — ABNORMAL LOW (ref 36.0–46.0)
Hemoglobin: 12.4 g/dL (ref 12.0–15.0)
LYMPHS PCT: 18 %
Lymphs Abs: 1.1 10*3/uL (ref 0.7–4.0)
MCH: 34.7 pg — ABNORMAL HIGH (ref 26.0–34.0)
MCHC: 35.6 g/dL (ref 30.0–36.0)
MCV: 97.5 fL (ref 78.0–100.0)
Monocytes Absolute: 0.4 10*3/uL (ref 0.1–1.0)
Monocytes Relative: 7 %
NEUTROS ABS: 4.6 10*3/uL (ref 1.7–7.7)
Neutrophils Relative %: 74 %
PLATELETS: 445 10*3/uL — AB (ref 150–400)
RBC: 3.57 MIL/uL — AB (ref 3.87–5.11)
RDW: 13.4 % (ref 11.5–15.5)
WBC: 6.2 10*3/uL (ref 4.0–10.5)

## 2015-06-20 MED ORDER — SODIUM CHLORIDE 0.9% FLUSH
3.0000 mL | Freq: Two times a day (BID) | INTRAVENOUS | Status: DC
  Administered 2015-06-21 – 2015-06-22 (×2): 3 mL via INTRAVENOUS

## 2015-06-20 MED ORDER — SODIUM CHLORIDE 0.9 % IV BOLUS (SEPSIS)
500.0000 mL | Freq: Once | INTRAVENOUS | Status: AC
  Administered 2015-06-20: 500 mL via INTRAVENOUS

## 2015-06-20 MED ORDER — RALOXIFENE HCL 60 MG PO TABS
60.0000 mg | ORAL_TABLET | Freq: Every day | ORAL | Status: DC
  Administered 2015-06-21 – 2015-06-22 (×3): 60 mg via ORAL
  Filled 2015-06-20 (×4): qty 1

## 2015-06-20 MED ORDER — INSULIN DEGLUDEC 100 UNIT/ML ~~LOC~~ SOPN: 8.0000 [IU] | PEN_INJECTOR | Freq: Every day | SUBCUTANEOUS | Status: DC

## 2015-06-20 MED ORDER — LEVOTHYROXINE SODIUM 100 MCG PO TABS
100.0000 ug | ORAL_TABLET | Freq: Every day | ORAL | Status: DC
  Administered 2015-06-21 – 2015-06-23 (×3): 100 ug via ORAL
  Filled 2015-06-20 (×3): qty 1

## 2015-06-20 MED ORDER — ENOXAPARIN SODIUM 40 MG/0.4ML ~~LOC~~ SOLN
40.0000 mg | Freq: Every day | SUBCUTANEOUS | Status: DC
  Administered 2015-06-21 – 2015-06-22 (×3): 40 mg via SUBCUTANEOUS
  Filled 2015-06-20 (×3): qty 0.4

## 2015-06-20 MED ORDER — INSULIN ASPART 100 UNIT/ML ~~LOC~~ SOLN
0.0000 [IU] | Freq: Three times a day (TID) | SUBCUTANEOUS | Status: DC
  Administered 2015-06-21: 3 [IU] via SUBCUTANEOUS
  Administered 2015-06-21: 5 [IU] via SUBCUTANEOUS
  Administered 2015-06-21: 3 [IU] via SUBCUTANEOUS
  Administered 2015-06-22 – 2015-06-23 (×4): 2 [IU] via SUBCUTANEOUS
  Administered 2015-06-23: 1 [IU] via SUBCUTANEOUS

## 2015-06-20 MED ORDER — LORAZEPAM 0.5 MG PO TABS
0.5000 mg | ORAL_TABLET | Freq: Two times a day (BID) | ORAL | Status: DC
  Administered 2015-06-21 – 2015-06-23 (×6): 0.5 mg via ORAL
  Filled 2015-06-20 (×6): qty 1

## 2015-06-20 MED ORDER — ASPIRIN 325 MG PO TABS
325.0000 mg | ORAL_TABLET | Freq: Every day | ORAL | Status: DC
  Administered 2015-06-21 – 2015-06-23 (×3): 325 mg via ORAL
  Filled 2015-06-20 (×3): qty 1

## 2015-06-20 MED ORDER — DULOXETINE HCL 20 MG PO CPEP
40.0000 mg | ORAL_CAPSULE | Freq: Every day | ORAL | Status: DC
  Administered 2015-06-21 – 2015-06-23 (×3): 40 mg via ORAL
  Filled 2015-06-20 (×3): qty 2

## 2015-06-20 MED ORDER — SODIUM CHLORIDE 0.9 % IV SOLN
INTRAVENOUS | Status: AC
  Administered 2015-06-21: 01:00:00 via INTRAVENOUS

## 2015-06-20 MED ORDER — ONDANSETRON HCL 4 MG/2ML IJ SOLN
4.0000 mg | Freq: Once | INTRAMUSCULAR | Status: AC
Start: 2015-06-20 — End: 2015-06-20
  Administered 2015-06-20: 4 mg via INTRAVENOUS
  Filled 2015-06-20: qty 2

## 2015-06-20 MED ORDER — INSULIN ASPART 100 UNIT/ML ~~LOC~~ SOLN: 0.0000 [IU] | Freq: Every day | SUBCUTANEOUS | Status: DC

## 2015-06-20 NOTE — ED Notes (Signed)
Pt reports to the ED for eval of syncopal episode. Per EMS she went to her PCP to have blood drawn to recheck her sodium. She was in the waiting room of her PCPs office and had a syncopal episode. Denies any CP or SOB. Reports some dizziness. PCP stated pt was pale after the episode but her color had returned upon EMS arrival. 12 lead unremarkable. VSS en route. Pt also reports generalized weakness and decreased PO intake this week. Pt A&Ox4, resp e/u, and skin warm and dry.

## 2015-06-20 NOTE — H&P (Signed)
Leslie Mccann [id number] DOB: 11/23/1945 DOA: 06/20/2015   Referring MD Kathrynn Humble PCP: Sheela Stack, MD   Outpatient Specialists: Neurology Eleon[facility]. Pulmonology [place] Patient coming from:  [facility]  Chief Complaint: syncope  HPI: Leslie Mccann is a 70 y.o. 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 She was hospitalized at [facility] for hyponatremia Na down to 120 and was discharged on fluid restrictionTo PCP office to get labs rechecked given history of hyponatremia when patient got up to get her labs done she felt lightheaded and then syncopized a few minutes she appeared to be healing EMS was called EKG was unremarkable and she reported decreased by mouth intake for the past 1 week    Regarding pertinent Chronic problems: Regarding history of diabetes mellitus type 1, followed by Dr. Forde Dandy hemoglobin A1c 8.7 2017 History of hypercholesteremia. Patient had in the past TIA stroke workup in March as an 17 Echogram at that time showed normal EF without grade 1 diastolic heart failure aortic stenosis EGD done at that she'll showed possible rheumatologic potential in the left frontal region was started on Depakote 500 mg twice a day she developed increased lethargy and the plan was to switch her to [place] she took 2 doses and felt lethargic her Endocrinologist told them to stop  He recently has been diagnosed with hyponatremia and was seen in follow-up with her PCP today regarding this IN ER: Orthostatics was checked patient was noted to be significantly orthostatic. She was given IV fluids 500 mL we checked orthostatics was still lightheaded then given and now 500 mL. Given persistent symptoms upon  standing hospitalist was called for an admission Sodium 131 potassium 5.3 creatinine is 0.71 UA large ketones    Hospitalist was called for admission for  Syncope and orthostasis  Review of Systems:    Pertinent positives include: lightheadedness, fatigue,  Constitutional:  No weight loss, night sweats, Fevers, chills,  weight loss  HEENT:  No headaches, Difficulty swallowing,Tooth/dental problems,Sore throat,  No sneezing, itching, ear ache, nasal congestion, post nasal drip,  Cardio-vascular:  No chest pain, Orthopnea, PND, anasarca, dizziness, palpitations.no Bilateral lower extremity swelling  GI:  No heartburn, indigestion, abdominal pain, nausea, vomiting, diarrhea, change in bowel habits, loss of appetite, melena, blood in stool, hematemesis Resp:  no shortness of breath at rest. No dyspnea on exertion, No excess mucus, no productive cough, No non-productive cough, No coughing up of blood.No change in color of mucus.No wheezing. Skin:  no rash or lesions. No jaundice GU:  no dysuria, change in color of urine, no urgency or frequency. No straining to urinate.  No flank pain.  Musculoskeletal:  No joint pain or no joint swelling. No decreased range of motion. No back pain.  Psych:  No change in mood or affect. No depression or anxiety. No memory loss.  Neuro: no localizing neurological complaints, no tingling, no weakness, no double vision, no gait abnormality, no slurred speech, no confusion  As per HPI otherwise 10 point review of systems negative.   Past Medical History: Past Medical History  Diagnosis Date  . Diabetes mellitus   . Depression   . Anxiety   . Retinopathy   . TIA (transient ischemic  attack) 10/16/2013  . Diabetic retinopathy ([place]) 10/16/2013  . Snorings 10/16/2013  . 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 Asthma     childhood  . Tremors of nervous system    Past Surgical History  Procedure Laterality Date  . Eye surgeries     . Cataract extraction,  bilateral    . Colonoscopy       Social History:  Ambulatory   independently   Lives at home   With family     reports that she quit smoking about 17 years ago. Her smoking use included Cigarettes. She has a 10 pack-year smoking history. She has never used smokeless tobacco. She reports that she drinks alcohol. She reports that she does not use illicit drugs.  Allergies:   Allergies  Allergen Reactions  . Codeine Nausea And Vomiting  . Sulfa Antibiotics Nausea And Vomiting  . Adhesive [Tape] Rash    pls use paper tape  . Latex Rash       Family History:    Family History  Problem Relation Age of Onset  . Cancer Mother   . COPD Father   . Diabetes Father   . Hypertension Maternal Grandmother   . Stroke Maternal Grandmother   . Stroke Maternal Grandfather   . Cancer Maternal Grandfather   . Hypothyroidism Daughter     Medications: Prior to Admission medications   Medication Sig Start Date End Date Taking? Authorizing Provider  aspirin 325 MG tablet Take 1 tablet (325 mg total) by mouth daily. Patient taking differently: Take 325 mg by mouth daily after breakfast.  04/29/15  Yes Velvet Bathe, MD  DULoxetine (CYMBALTA) 20 MG capsule Take 40 mg by mouth daily.    Yes Historical Provider, MD  glucagon (GLUCAGON EMERGENCY) 1 MG injection Inject 1 mg into the muscle once as needed.   Yes Historical Provider, MD  Insulin Degludec (TRESIBA FLEXTOUCH) 100 UNIT/ML SOPN Inject 8 Units into the skin at bedtime.    Yes Historical Provider, MD  insulin lispro (HUMALOG) 100 UNIT/ML injection Inject 2-7 Units into the skin 3 (three) times daily as needed for high blood sugar (CBG >180). Per sliding scale   Yes Historical Provider, MD  levothyroxine (SYNTHROID, LEVOTHROID) 100 MCG tablet Take 100 mcg by mouth daily before breakfast.    Yes Historical Provider, MD  LORazepam (ATIVAN) 0.5 MG tablet Take 0.5 mg by mouth 2 (two) times daily. 05/30/15  Yes Historical Provider, MD  Melatonin  3 MG TABS Take 3 mg by mouth at bedtime.   Yes Historical Provider, MD  OXYGEN Inhale 2 L into the lungs at bedtime.   Yes Historical Provider, MD  raloxifene (EVISTA) 60 MG tablet Take 60 mg by mouth at bedtime.    Yes Historical Provider, MD  Vitamin D, Ergocalciferol, (DRISDOL) 50000 UNITS CAPS capsule Take 50,000 Units by mouth every Wednesday.  12/16/13  Yes Historical Provider, MD  divalproex (DEPAKOTE ER) 500 MG 24 hr tablet Take 1 tablet (500 mg total) by mouth at bedtime. Patient not taking: Reported on 06/09/2015 05/09/15   Star Age, MD  levETIRAcetam (KEPPRA) 500 MG tablet 1/2 tablet twice a day for 1 week, then take 1 tablet twice daily Patient not taking: Reported on 06/20/2015 05/26/15   Kathrynn Ducking, MD  Misc. Devices (FINGERTIP PULSE OXIMETER) MISC Use as directed 01/19/14   Collene Gobble, MD  Misc. Devices (PULSE OXIMETER) MISC 1 Device by Does not apply route as directed. 01/29/14   Collene Gobble, MD  ONE TOUCH ULTRA TEST test strip  11/25/13   Historical Provider, MD    Physical Exam: Patient Vitals for the past 24 hrs:  BP Temp Temp src Pulse Resp SpO2  06/20/15 1915 145/82 mmHg - - 89 16 96 %  06/20/15 1900 132/73 mmHg - - 89 12 99 %  06/20/15 1845 152/69 mmHg - - 88 18 99 %  06/20/15 1830 160/77 mmHg - - 89 16 100 %  06/20/15 1815 150/84 mmHg - - 92 17 95 %  06/20/15 1800 135/81 mmHg - - 87 18 99 %  06/20/15 1745 140/71 mmHg - - 86 16 99 %  06/20/15 1715 127/76 mmHg - - 87 13 99 %  06/20/15 1630 142/59 mmHg - - 82 (!) 29 99 %  06/20/15 1615 165/77 mmHg - - 81 13 100 %  06/20/15 1600 163/76 mmHg - - 82 19 100 %  06/20/15 1530 169/75 mmHg - - 80 15 100 %  06/20/15 1515 169/94 mmHg - - 79 16 100 %  06/20/15 1509 155/84 mmHg 98.9 F (37.2 C) Oral 80 24 100 %    1. General:  in No Acute distress 2. Psychological: Alert and  Oriented 3. Head/ENT:     Dry Mucous Membranes                          Head Non traumatic, neck supple                          Normal   Dentition 4. SKIN:  decreased Skin turgor,  Skin clean Dry and intact no rash 5. Heart: Regular rate and rhythm no  Murmur, Rub or gallop 6. Lungs:  Clear to auscultation bilaterally, no wheezes or crackles   7. Abdomen: Soft, non-tender, Non distended 8. Lower extremities: no clubbing, cyanosis, or edema 9. Neurologically Grossly intact, moving all 4 extremities equally 10. MSK: Normal range of motion   body mass index is unknown because there is no weight on file.  Labs on Admission:   Labs on Admission: I have personally reviewed following labs and imaging studies  CBC:  Recent Labs Lab 06/20/15 1538  WBC 6.2  NEUTROABS 4.6  HGB 12.4  HCT 34.8*  MCV 97.5  PLT [id number]*   Basic Metabolic Panel:  Recent Labs Lab 06/20/15 1538  NA 131*  K 5.3*  CL 98*  CO2 23  GLUCOSE 158*  BUN 15  CREATININE 0.71  CALCIUM 8.6*   GFR: CrCl cannot be calculated (Unknown ideal weight.). Liver Function Tests: No results for input(s): AST, ALT, ALKPHOS, BILITOT, PROT, ALBUMIN in the last 168 hours. No results for input(s): LIPASE, AMYLASE in the last 168 hours. No results for input(s): AMMONIA in the last 168 hours. Coagulation Profile: No results for input(s): INR, PROTIME in the last 168 hours. Cardiac Enzymes: No results for input(s): CKTOTAL, CKMB, CKMBINDEX, TROPONINI in the last 168 hours. BNP (last 3 results) No results for input(s): PROBNP in the last 8760 hours. HbA1C: No results for input(s): HGBA1C in the last 72 hours. CBG: No results for input(s): GLUCAP in the last 168 hours. Lipid Profile: No results for input(s): CHOL, HDL, LDLCALC, TRIG, CHOLHDL, LDLDIRECT in the last 72 hours. Thyroid Function Tests: No results for input(s): TSH, T4TOTAL, FREET4, T3FREE, THYROIDAB in the last 72 hours. Anemia Panel: No results for input(s): VITAMINB12, FOLATE, FERRITIN, TIBC, IRON, RETICCTPCT in the last 72 hours.  Urine analysis:    Component Value Date/Time   COLORURINE  AMBER* 06/20/2015 1607   APPEARANCEUR CLOUDY* 06/20/2015 1607   LABSPEC 1.021 06/20/2015 1607   PHURINE 8.5* 06/20/2015 1607   GLUCOSEU NEGATIVE 06/20/2015 1607   HGBUR NEGATIVE 06/20/2015 1607   BILIRUBINUR SMALL* 06/20/2015 1607   KETONESUR >80* 06/20/2015 1607   PROTEINUR 30* 06/20/2015 1607   UROBILINOGEN 1.0 09/21/2012 0129   NITRITE NEGATIVE 06/20/2015 1607   LEUKOCYTESUR NEGATIVE 06/20/2015 1607   Sepsis Labs: @LABRCNTIP (procalcitonin:4,lacticidven:4) )No results found for this or any previous visit (from the past 240 hour(s)).     UA many bacteria  No results found for: HGBA1C  CrCl cannot be calculated (Unknown ideal weight.).  BNP (last 3 results) No results for input(s): PROBNP in the last 8760 hours.   ECG REPORT  Independently reviewed Rate:82  Rhythm: NSR ST&T Change: No acute ischemic changes   QTC 452  There were no vitals filed for this visit.   Cultures:    Component Value Date/Time   SDES URINE, CLEAN CATCH 04/27/2015 1827   SPECREQUEST NONE 04/27/2015 1827   CULT  04/27/2015 1827    MULTIPLE SPECIES PRESENT, SUGGEST RECOLLECTION Performed at [facility] 04/29/2015 FINAL 04/27/2015 1827     Radiological Exams on Admission: No results found.  Chart has been reviewed    Assessment/Plan  70 y.o. 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 Syncope and collapse - likely orthostatic hypotension, will rehydrate, cycle CE, obtain serial ECG, echo monitor on tele, obtain repeat orthostatics . Hyponatremia - likely secondary to dehydration, will give IVF, check Urine Na, Cr, Osmolarity. Monitor Na levels to avoid over aggressive correction. Check TSH. Stop offending medications. If no  improvement with IVF will initiate further work up for SIADH if appropriate.  . Orthostatic hypotension - rehydrate and recheck . DM type 1 causing eye disease ([place]) - continue home insulin regimen and order SSI  Other plan as per orders.  DVT prophylaxis:    Lovenox      Code Status:  FULL CODE  as per patient   Family Communication:   Family  at  Bedside  plan of care was discussed with  Husband Stacie Glaze [id number]  Disposition Plan:  To home once workup is complete and patient is stable   Consults called: none  Admission status:  obs    Level of care      tele       I have spent a total of 56 min on this admission    Lakeva Hollon 06/21/2015, 2:26 AM    [facility]  Pager [phone]   after 2 AM please page floor coverage PA If 7AM-7PM, please contact the day team taking care of the patient  [email or web address]  Password [email or web address]

## 2015-06-20 NOTE — Progress Notes (Signed)
Pt. Admitted to 527. Alert and oriented x4 moe x4.vss. Family at bedside. Pt demonstrated the proper use of call bell and is aware of safety.Pt states she is still weak and she knows to call for bathroom assistance.

## 2015-06-20 NOTE — ED Notes (Signed)
ORDERED DIABETES CARB MOD TRAY--LESLIE

## 2015-06-20 NOTE — ED Notes (Signed)
Upon standing, patient became very dizzy with unsteady gait and nauseated.

## 2015-06-20 NOTE — ED Provider Notes (Signed)
CSN: [id number]     Arrival date & time 06/20/15  1449 History   First MD Initiated Contact with Patient 06/20/15 1510     Chief Complaint  Patient presents with  . Loss of Consciousness     (Consider location/radiation/quality/duration/timing/severity/associated sxs/prior Treatment) HPI Comments: Pt comes in with cc of fainting. Pt has hx of hyponatremia and is on fluid restriction - 32 oz daily. She also has hx of DM, TIA, ? Seizures (recently started on depakote). Pt was recently admitted to OSH and had hyponatremia with Na of 120. She was at her PCP's office to get Na checked, and when she got up, she started feeling dizzy/lightheaded, hot, turned pale and sweaty and then had fainting type spell lasting 5 seconds. No seizure like activity and no confusion. Pt denies any chest pain, dib, palpitations. Pt had a recent admission for TIA - neg MRI, normal echo.   ROS 10 Systems reviewed and are negative for acute change except as noted in the HPI.     Patient is a 70 y.o. female presenting with syncope. The history is provided by the patient.  Loss of Consciousness   Past Medical History  Diagnosis Date  . Diabetes mellitus   . Depression   . Anxiety   . Retinopathy   . TIA (transient ischemic attack) 10/16/2013  . Diabetic retinopathy ([place]) 10/16/2013  . Snorings 10/16/2013  . 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 Asthma     childhood  . Tremors of nervous system    Past Surgical History  Procedure Laterality Date  . Eye surgeries     . Cataract extraction, bilateral    . Colonoscopy     Family History  Problem Relation Age of Onset  . Cancer Mother   . COPD Father   . Diabetes Father   . Hypertension Maternal Grandmother   . Stroke Maternal Grandmother   . Stroke Maternal Grandfather   . Cancer Maternal Grandfather   . Hypothyroidism Daughter    Social History  Substance Use Topics  . Smoking status: Former Smoker -- 1.00 packs/day for 10 years    Types: Cigarettes    Quit date: 02/12/1998  . Smokeless tobacco: Never Used  . Alcohol Use: 0.0 oz/week    0 Standard drinks or equivalent per week     Comment: occassional    OB History    No data available     Review of Systems  Cardiovascular: Positive for syncope.      Allergies  Codeine; Sulfa antibiotics; Adhesive; and Latex  Home Medications   Prior to Admission medications   Medication Sig Start Date End Date Taking? Authorizing Provider  aspirin 325 MG tablet Take 1 tablet (325 mg total) by mouth daily. Patient taking differently: Take 325 mg by mouth daily after breakfast.  04/29/15  Yes Velvet Bathe, MD  DULoxetine (CYMBALTA) 20 MG capsule Take 40 mg by mouth daily.    Yes Historical Provider, MD  glucagon (GLUCAGON EMERGENCY) 1 MG injection Inject 1 mg into the muscle once as needed.   Yes Historical Provider, MD  Insulin Degludec (TRESIBA FLEXTOUCH) 100 UNIT/ML SOPN Inject 8 Units into the skin at bedtime.    Yes Historical Provider, MD  insulin lispro (HUMALOG) 100 UNIT/ML injection Inject 2-7 Units into the skin 3 (three) times daily as needed for high blood sugar (CBG >180). Per sliding scale   Yes Historical Provider, MD  levothyroxine (SYNTHROID, LEVOTHROID) 100 MCG tablet Take 100 mcg by mouth daily before  breakfast.    Yes Historical Provider, MD  LORazepam (ATIVAN) 0.5 MG tablet Take 0.5 mg by mouth 2 (two) times daily. 05/30/15  Yes Historical Provider, MD  Melatonin 3 MG TABS Take 3 mg by mouth at bedtime.   Yes Historical Provider, MD  OXYGEN Inhale 2 L into the lungs at bedtime.   Yes Historical Provider, MD  raloxifene (EVISTA) 60 MG tablet Take 60 mg by mouth at bedtime.    Yes Historical Provider, MD  Vitamin D, Ergocalciferol, (DRISDOL) 50000 UNITS CAPS capsule Take 50,000 Units by mouth every Wednesday.  12/16/13  Yes Historical Provider, MD  divalproex (DEPAKOTE ER) 500 MG 24 hr tablet Take 1 tablet (500 mg total) by mouth at bedtime. Patient not taking: Reported on  06/09/2015 05/09/15   Star Age, MD  levETIRAcetam (KEPPRA) 500 MG tablet 1/2 tablet twice a day for 1 week, then take 1 tablet twice daily Patient not taking: Reported on 06/20/2015 05/26/15   Kathrynn Ducking, MD  Misc. Devices (FINGERTIP PULSE OXIMETER) MISC Use as directed 01/19/14   Collene Gobble, MD  Misc. Devices (PULSE OXIMETER) MISC 1 Device by Does not apply route as directed. 01/29/14   Collene Gobble, MD  ONE TOUCH ULTRA TEST test strip  11/25/13   Historical Provider, MD   BP 127/76 mmHg  Pulse 87  Temp(Src) 98.9 F (37.2 C) (Oral)  Resp 13  SpO2 99% Physical Exam  Constitutional: She is oriented to person, place, and time. She appears well-developed.  HENT:  Head: Normocephalic and atraumatic.  Eyes: Conjunctivae and EOM are normal. Pupils are equal, round, and reactive to light.  Neck: Normal range of motion. Neck supple. No JVD present.  Cardiovascular: Normal rate, regular rhythm and normal heart sounds.   Pulmonary/Chest: Effort normal and breath sounds normal. No respiratory distress.  Abdominal: Soft. Bowel sounds are normal. She exhibits no distension. There is no tenderness. There is no rebound and no guarding.  Neurological: She is alert and oriented to person, place, and time. No cranial nerve deficit. Coordination normal.  Skin: Skin is warm and dry.    ED Course  Procedures (including critical care time) Labs Review Labs Reviewed  CBC WITH DIFFERENTIAL/PLATELET - Abnormal; Notable for the following:    RBC 3.57 (*)    HCT 34.8 (*)    MCH 34.7 (*)    Platelets 445 (*)    All other components within normal limits  BASIC METABOLIC PANEL - Abnormal; Notable for the following:    Sodium 131 (*)    Potassium 5.3 (*)    Chloride 98 (*)    Glucose, Bld 158 (*)    Calcium 8.6 (*)    All other components within normal limits  URINALYSIS, ROUTINE W REFLEX MICROSCOPIC (NOT AT [facility]) - Abnormal; Notable for the following:    Color, Urine AMBER (*)    APPearance  CLOUDY (*)    pH 8.5 (*)    Bilirubin Urine SMALL (*)    Ketones, ur >80 (*)    Protein, ur 30 (*)    All other components within normal limits  URINE MICROSCOPIC-ADD ON - Abnormal; Notable for the following:    Squamous Epithelial / LPF 0-5 (*)    Bacteria, UA MANY (*)    All other components within normal limits  URINE CULTURE  VALPROIC ACID LEVEL    Imaging Review No results found. I have personally reviewed and evaluated these images and lab results as part of my  medical decision-making.   EKG Interpretation   Date/Time:  Monday Jun 20 2015 15:29:18 EDT Ventricular Rate:  82 PR Interval:  149 QRS Duration: 90 QT Interval:  387 QTC Calculation: 452 R Axis:   65 Text Interpretation:  Sinus rhythm Nonspecific ST and T wave abnormality  No acute changes Confirmed by Kathrynn Humble, MD, Thelma Comp [phone]) on 06/20/2015  4:11:45 PM      MDM   Final diagnoses:  Syncope due to orthostatic hypotension  Dehydration    Pt comes in with syncope.  DDx includes: Orthostatic hypotension Stroke Vertebral artery dissection/stenosis Dysrhythmia PE Vasovagal/neurocardiogenic syncope Aortic stenosis Valvular disorder/Cardiomyopathy Anemia  It seems like she had vasovagal event/ neurocardiogenic event. Symptoms also consistent with dehydration related orthostatics given her fluid restriction. We will get orthostatics and basic labs. Neuro exam is non focal. No symptoms when supine.  @6 :00: Pt s/p 500 cc of fluids due to orthostasis. Ambulated post - and she is still unsteady. Urine + ketones.  We will give more fluids. We will also check depakote level - although the sx dont appear to be depakote toxicity.      Varney Biles, MD 06/20/15 [phone]

## 2015-06-20 NOTE — ED Notes (Signed)
Attempted to ambulate patient in hallway. After standing up, patient immediately stated that she needed to sit back down because she felt dizzy and weak.

## 2015-06-20 NOTE — ED Notes (Signed)
Admitting MD at the bedside.  

## 2015-06-21 ENCOUNTER — Observation Stay (HOSPITAL_BASED_OUTPATIENT_CLINIC_OR_DEPARTMENT_OTHER): Payer: Medicare Other

## 2015-06-21 ENCOUNTER — Observation Stay (HOSPITAL_COMMUNITY): Payer: Medicare Other

## 2015-06-21 DIAGNOSIS — R55 Syncope and collapse: Secondary | ICD-10-CM | POA: Diagnosis not present

## 2015-06-21 DIAGNOSIS — E039 Hypothyroidism, unspecified: Secondary | ICD-10-CM

## 2015-06-21 DIAGNOSIS — E1039 Type 1 diabetes mellitus with other diabetic ophthalmic complication: Secondary | ICD-10-CM | POA: Diagnosis not present

## 2015-06-21 DIAGNOSIS — R569 Unspecified convulsions: Secondary | ICD-10-CM

## 2015-06-21 DIAGNOSIS — G4734 Idiopathic sleep related nonobstructive alveolar hypoventilation: Secondary | ICD-10-CM

## 2015-06-21 DIAGNOSIS — E871 Hypo-osmolality and hyponatremia: Secondary | ICD-10-CM | POA: Diagnosis not present

## 2015-06-21 DIAGNOSIS — I951 Orthostatic hypotension: Secondary | ICD-10-CM | POA: Diagnosis not present

## 2015-06-21 LAB — BASIC METABOLIC PANEL
Anion gap: 9 (ref 5–15)
BUN: 9 mg/dL (ref 6–20)
CALCIUM: 8.5 mg/dL — AB (ref 8.9–10.3)
CO2: 24 mmol/L (ref 22–32)
CREATININE: 0.71 mg/dL (ref 0.44–1.00)
Chloride: 101 mmol/L (ref 101–111)
GFR calc Af Amer: >60 mL/min (ref >60)
GLUCOSE: 235 mg/dL — AB (ref 65–99)
Potassium: 4.1 mmol/L (ref 3.5–5.1)
SODIUM: 134 mmol/L — AB (ref 135–145)

## 2015-06-21 LAB — GLUCOSE, CAPILLARY
GLUCOSE-CAPILLARY: 246 mg/dL — AB (ref 65–99)
GLUCOSE-CAPILLARY: 210 mg/dL — AB (ref 65–99)
Glucose-Capillary: 237 mg/dL — ABNORMAL HIGH (ref 65–99)
Glucose-Capillary: 285 mg/dL — ABNORMAL HIGH (ref 65–99)
Glucose-Capillary: 244 mg/dL — ABNORMAL HIGH (ref 65–99)

## 2015-06-21 LAB — TSH: TSH: 1.591 u[IU]/mL (ref 0.350–4.500)

## 2015-06-21 LAB — TROPONIN I
Troponin I: <0.03 ng/mL (ref <0.031)
Troponin I: <0.03 ng/mL (ref <0.031)

## 2015-06-21 LAB — ECHOCARDIOGRAM COMPLETE
Height: 67.2 in
Weight: 1900.8 oz

## 2015-06-21 MED ORDER — INSULIN GLARGINE 100 UNIT/ML ~~LOC~~ SOLN
8.0000 [IU] | Freq: Every day | SUBCUTANEOUS | Status: DC
  Administered 2015-06-21: 8 [IU] via SUBCUTANEOUS
  Filled 2015-06-21 (×2): qty 0.08

## 2015-06-21 MED ORDER — INSULIN ASPART 100 UNIT/ML ~~LOC~~ SOLN
2.0000 [IU] | Freq: Three times a day (TID) | SUBCUTANEOUS | Status: DC
  Administered 2015-06-22: 1 [IU] via SUBCUTANEOUS
  Administered 2015-06-22 – 2015-06-23 (×3): 2 [IU] via SUBCUTANEOUS

## 2015-06-21 MED ORDER — SODIUM CHLORIDE 0.9 % IV SOLN
INTRAVENOUS | Status: AC
  Administered 2015-06-21 (×2): via INTRAVENOUS

## 2015-06-21 MED ORDER — INSULIN DEGLUDEC 100 UNIT/ML ~~LOC~~ SOPN
8.0000 [IU] | PEN_INJECTOR | Freq: Every day | SUBCUTANEOUS | Status: DC
  Administered 2015-06-21 – 2015-06-22 (×2): 8 [IU] via SUBCUTANEOUS

## 2015-06-21 NOTE — Progress Notes (Signed)
Echocardiogram 2D Echocardiogram has been performed.  [name] 06/21/2015, 11:36 AM

## 2015-06-21 NOTE — Care Management Note (Signed)
Case Management Note  Patient Details  Name: Leslie Mccann MRN: [id number] Date of Birth: Sep 28, 1945  Subjective/Objective:   Admitted with syncope. From home with husband Leslie Mccann  [phone]). Independent with ADL'S  PTA. No DME usage.PCP: Dr. Viona Gilmore. Forde Dandy.   Action/Plan: Awaiting PT evaluation........Marland KitchenCM to f/u with disposition needs.   Expected Discharge Date:                  Expected Discharge Plan:    In-House Referral:     Discharge planning Services  CM Consult  Post Acute Care Choice:    Choice offered to:  Patient  DME Arranged:    DME Agency:     HH Arranged:    HH Agency:  [facility]  Status of Service:  In process, will continue to follow  Medicare Important Message Given:    Date Medicare IM Given:    Medicare IM give by:    Date Additional Medicare IM Given:    Additional Medicare Important Message give by:     If discussed at [place] of Stay Meetings, dates discussed:    Additional Comments: CM spoke with pt /husband regarding d/c planning. Pt/husband agreed if home health services are needed @ d/c they would like to use [facility]  Whitman Hero [place], [place] [phone] 06/21/2015, 5:23 PM

## 2015-06-21 NOTE — Care Management Obs Status (Signed)
[place] NOTIFICATION   Patient Details  Name: Leslie Mccann MRN: [id number] Date of Birth: 1945/11/29   Medicare Observation Status Notification Given:  Yes    Sharin Mons, RN 06/21/2015, 10:59 AM

## 2015-06-21 NOTE — Telephone Encounter (Signed)
FYI husband reports that patient is back in hospital after syncopal episode. He also wanted you to know that the patient had felt better after stopping Depakote.

## 2015-06-21 NOTE — Progress Notes (Addendum)
Inpatient Diabetes Program Recommendations  AACE/ADA: New Consensus Statement on Inpatient Glycemic Control (2015)  Target Ranges:  Prepandial:   less than 140 mg/dL      Peak postprandial:   less than 180 mg/dL (1-2 hours)      Critically ill patients:  140 - 180 mg/dL  Results for Leslie Mccann, Leslie Mccann (MRN [id number]) as of 06/21/2015 14:54  Ref. Range 06/20/2015 21:06 06/21/2015 01:50 06/21/2015 07:49 06/21/2015 12:04  Glucose-Capillary Latest Ref Range: 65-99 mg/dL 232 (H) 246 (H) 210 (H) 237 (H)   Review of Glycemic Control  Diabetes history: DM Type 1 Outpatient Diabetes medications: Tresiba 8 units q hs + Humalog 1 unit/15 gm carbohydrate meal coverage + correction scale Current orders for Inpatient glycemic control: Tresiba 8 units q hs + Novolog correction 0-9 units tid with meals + 0-5 units q hs  Inpatient Diabetes Program Recommendations:  Please consider adding 2-3 units meal coverage tid with meals (hold if eats < 50%). Patient's husband has [place] from home to be administered per orders. Husband also requests dietician to see patient and will place orders. Spoke with Dr. Algis Liming and received verbal order for Novolog 2 units tid with meals and hold if eats < 50%.  Thank you, Nani Gasser. Aimie Wagman, RN, MSN, CDE Inpatient Glycemic Control Team Team Pager [phone] (8am-5pm) 06/21/2015 2:59 PM

## 2015-06-21 NOTE — Telephone Encounter (Signed)
I called husband to let him know that the only thing we have received is the ADH lab. I advised him that patient will have to come by and sign ROI form so we can request those records. Jeneen Rinks stated that I need to call [facility] and ask for the records. I explained again to him that they will need a ROI form signed by patient showing that she is giving permission for her records to be released. He said that they cannot come at this time due to patient is in hospital now after syncopal episode.

## 2015-06-21 NOTE — Progress Notes (Signed)
PROGRESS NOTE  Leslie Mccann  [id number] DOB: [birth year]  DOA: 06/20/2015 PCP: Sheela Stack, MD  Outpatient Specialists:  Neurology: Dr. 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 Recent hospitalization at [facility] for hyponatremia/sodium in the 120s, had single episode of seizure attributed to low sodium but eventually taken off antiepileptics after 2 subsequent EEGs were normal. Per EMS, EKG unremarkable. Admitted for syncope, likely from orthostatic hypotension related to fluid restriction and dehydration.    Assessment & Plan:   Principal Problem:   Syncope and collapse Active Problems:   DM retinopathy (HCC)   Nocturnal hypoxemia   Insulin dependent diabetes mellitus (HCC)   Hyponatremia   Hypothyroidism   Seizures (HCC)   Orthostatic hypotension   DM type 1 causing eye disease ([place])   Syncope - Likely related to dehydration from fluid restriction for hyponatremia/? SIADH. - On admission, BP 151/67 (lying) >109/68 (standing) - gently hydrate with IV fluids. Follow orthostatic blood pressures daily. - Telemetry: Sinus rhythm without arrhythmias. - Repeat 2-D echo: Pending. As per admitting M.D.: Echocardiogram recently showed normal EF and grade 1 diastolic heart failure and? Aortic stenosis & EKG unremarkable - No seizure-like activity reported.  Orthostatic hypotension - Secondary to intravascular volume depletion. Continue additional 24 hours of IV fluids.  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 Was on fluid restriction 32 ounces of liquids per day. Recently hospitalized at [facility] for sodium in the 120s. - Admitting sodium of 131.  - We will request office records from PCP. - TSH normal. - Sodium improved from 131 > 134  Mild hyperkalemia - Unclear etiology. Creatinine normal. Resolved.  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 - Hemoglobin A1c 8.7 as per admitting MDs note. - As per spouse, brittle diabetes. Received Lantus 8 units at approximately 2 AM today. CBGs in the 200s. - Discussed with pharmacy, okay to change Lantus to Tresiba 8 units at bedtime. Spouse has patient's home supply. Continue NovoLog sensitive SSI. Monitor closely  Hypothyroid - Continue Synthroid. TSH normal.  Anxiety & depression - Continue Cymbalta and Ativan.  Nocturnal hypoxemia - Continue nighttime oxygen.  Seizure - As per spouse, patient had an episode of "focal seizures" while she was hyponatremic and admitted at [facility]. Upon advised by PCP, has stopped all antiepileptics. Subsequent to EEGs apparently normal. Follows with neurology as outpatient.  Hyperlipidemia  Asymptomatic bacteriuria - Denies dysuria, suprapubic pain, fever or chills. Some urinary frequency.   DVT prophylaxis: Lovenox Code Status: Full Family Communication: Discussed extensively with patient's spouse at bedside. Updated care and answered all questions. Disposition Plan: PT evaluation. DC home when medically stable.   Consultants:   None  Procedures:   None  Antimicrobials:   None    Subjective: States that she feels better. Stronger. Indicates feeling of "carsick" but cannot elaborate. No dizziness, lightheadedness, vertigo reported. No nausea or vomiting. No dysuria, fever or chills.  Objective:  Filed Vitals:   06/20/15 2033 06/20/15 2059 06/20/15 2100 06/21/15 0413  BP:  168/80  128/68  Pulse:   85   Temp: 98.5 F (36.9 C)  98.5 F (36.9 C) 98.3 F (36.8 C)  TempSrc: Oral  Oral Oral  Resp:   16 16  Height:  5' 7.2" (1.707 m)    Weight:  53.887 kg (118 lb 12.8 oz)    SpO2:   100% 95%    Intake/Output Summary (Last 24 hours) at 06/21/15 1239 Last data filed at 06/21/15 0600  Gross per 24 hour  Intake 1748.33 ml  Output    200 ml  Net 1548.33 ml   Filed Weights   06/20/15 2059  Weight: 53.887 kg (118 lb 12.8 oz)    Examination:  General exam: Pleasant elderly female sitting up comfortably in bed. Does not look septic or toxic. Oral mucosa dry. Reduced skin turgor. Respiratory system: Clear to auscultation. Respiratory effort normal. Cardiovascular system: S1 & S2 heard, RRR. No JVD, murmurs, rubs, gallops or clicks. No pedal edema. Telemetry: Sinus rhythm. Gastrointestinal system: Abdomen is nondistended, soft and nontender. No organomegaly or masses felt. Normal bowel sounds heard. Central nervous system: Alert and oriented. No focal neurological deficits. Extremities: Symmetric 5 x 5 power. Skin: No rashes, lesions or ulcers Psychiatry: Judgement and insight appear normal. Mood & affect appropriate.     Data Reviewed: I have personally reviewed following labs and imaging studies  CBC:  Recent Labs Lab 06/20/15 1538  WBC 6.2  NEUTROABS 4.6  HGB 12.4  HCT 34.8*  MCV 97.5  PLT [id number]*   Basic Metabolic Panel:  Recent Labs Lab 06/20/15 1538 06/21/15 1135  NA 131* 134*  K 5.3* 4.1  CL 98* 101  CO2 23 24  GLUCOSE 158* 235*  BUN 15 9  CREATININE 0.71 0.71  CALCIUM 8.6* 8.5*   GFR: Estimated Creatinine Clearance: 55.7 mL/min (by C-G formula based on Cr of 0.71). Liver Function Tests: No results for input(s): AST, ALT, ALKPHOS, BILITOT, PROT, ALBUMIN in the last 168 hours. No results for input(s): LIPASE, AMYLASE in the last 168 hours. No results for input(s): AMMONIA in the last 168 hours. Coagulation Profile: No results for  input(s): INR, PROTIME in the last 168 hours. Cardiac Enzymes:  Recent Labs Lab 06/20/15 2359 06/21/15 0554 06/21/15 1135  TROPONINI <0.03 <0.03 <0.03   BNP (last 3 results) No results for input(s): PROBNP in the last 8760 hours. HbA1C: No results for input(s): HGBA1C in the last 72 hours. CBG:  Recent Labs Lab 06/20/15 2106 06/21/15 0150 06/21/15 0749 06/21/15 1204  GLUCAP 232* 246* 210* 237*   Lipid Profile: No results for input(s): CHOL, HDL, LDLCALC, TRIG, CHOLHDL, LDLDIRECT in the last 72 hours. Thyroid Function Tests:  Recent Labs  06/21/15 0554  TSH 1.591   Anemia Panel: No results for input(s): VITAMINB12, FOLATE, FERRITIN, TIBC, IRON, RETICCTPCT in the last 72 hours.  Sepsis Labs: No results for input(s): PROCALCITON, LATICACIDVEN in the last 168 hours.  No results found for this or any previous visit (from the past 240 hour(s)).       Radiology Studies: X-ray Chest Pa And Lateral  06/21/2015  CLINICAL DATA:  70 year old female with hyponatremia and syncope. EXAM: CHEST  2 VIEW COMPARISON:  Chest radiograph dated 09/21/2012 FINDINGS: Two views of the chest demonstrate emphysematous changes of the lungs with increased AP diameter of the thoracic cage. There is no focal consolidation, pleural effusion, or pneumothorax. The cardiac silhouette is within normal limits. No acute osseous pathology. IMPRESSION: No active cardiopulmonary disease. Electronically Signed  By: Anner Crete M.D.   On: 06/21/2015 02:47        Scheduled Meds: . aspirin  325 mg Oral Daily  . DULoxetine  40 mg Oral Daily  . enoxaparin (LOVENOX) injection  40 mg Subcutaneous QHS  . insulin aspart  0-5 Units Subcutaneous QHS  . insulin aspart  0-9 Units Subcutaneous TID WC  . Insulin Degludec  8 Units Subcutaneous QHS  . levothyroxine  100 mcg Oral QAC breakfast  . LORazepam  0.5 mg Oral BID  . raloxifene  60 mg Oral QHS  . sodium chloride flush  3 mL Intravenous Q12H    Continuous Infusions: . sodium chloride          Time spent: 45 minutes.    [facility], MD [facility] Pager [phone]  If 7PM-7AM, please contact night-coverage [email or web address] Password [facility] 06/21/2015, 12:39 PM

## 2015-06-22 ENCOUNTER — Ambulatory Visit: Payer: Medicare Other | Admitting: Physical Therapy

## 2015-06-22 ENCOUNTER — Ambulatory Visit: Payer: Managed Care, Other (non HMO) | Admitting: Physical Therapy

## 2015-06-22 DIAGNOSIS — R0902 Hypoxemia: Secondary | ICD-10-CM | POA: Diagnosis present

## 2015-06-22 DIAGNOSIS — E785 Hyperlipidemia, unspecified: Secondary | ICD-10-CM | POA: Diagnosis present

## 2015-06-22 DIAGNOSIS — Z794 Long term (current) use of insulin: Secondary | ICD-10-CM | POA: Diagnosis not present

## 2015-06-22 DIAGNOSIS — E222 Syndrome of inappropriate secretion of antidiuretic hormone: Secondary | ICD-10-CM | POA: Diagnosis present

## 2015-06-22 DIAGNOSIS — E10319 Type 1 diabetes mellitus with unspecified diabetic retinopathy without macular edema: Secondary | ICD-10-CM | POA: Diagnosis present

## 2015-06-22 DIAGNOSIS — I951 Orthostatic hypotension: Secondary | ICD-10-CM | POA: Diagnosis present

## 2015-06-22 DIAGNOSIS — E1040 Type 1 diabetes mellitus with diabetic neuropathy, unspecified: Secondary | ICD-10-CM | POA: Diagnosis present

## 2015-06-22 DIAGNOSIS — F419 Anxiety disorder, unspecified: Secondary | ICD-10-CM | POA: Diagnosis present

## 2015-06-22 DIAGNOSIS — F329 Major depressive disorder, single episode, unspecified: Secondary | ICD-10-CM | POA: Diagnosis present

## 2015-06-22 DIAGNOSIS — E039 Hypothyroidism, unspecified: Secondary | ICD-10-CM | POA: Diagnosis present

## 2015-06-22 DIAGNOSIS — R55 Syncope and collapse: Secondary | ICD-10-CM | POA: Diagnosis present

## 2015-06-22 DIAGNOSIS — E860 Dehydration: Secondary | ICD-10-CM | POA: Diagnosis present

## 2015-06-22 DIAGNOSIS — E1065 Type 1 diabetes mellitus with hyperglycemia: Secondary | ICD-10-CM | POA: Diagnosis present

## 2015-06-22 DIAGNOSIS — E875 Hyperkalemia: Secondary | ICD-10-CM | POA: Diagnosis present

## 2015-06-22 LAB — BASIC METABOLIC PANEL
ANION GAP: 9 (ref 5–15)
BUN: <5 mg/dL — ABNORMAL LOW (ref 6–20)
CALCIUM: 8.4 mg/dL — AB (ref 8.9–10.3)
CO2: 26 mmol/L (ref 22–32)
Chloride: 104 mmol/L (ref 101–111)
Creatinine, Ser: 0.47 mg/dL (ref 0.44–1.00)
GFR calc non Af Amer: >60 mL/min (ref >60)
Glucose, Bld: 156 mg/dL — ABNORMAL HIGH (ref 65–99)
POTASSIUM: 3.7 mmol/L (ref 3.5–5.1)
SODIUM: 139 mmol/L (ref 135–145)

## 2015-06-22 LAB — GLUCOSE, CAPILLARY
GLUCOSE-CAPILLARY: 155 mg/dL — AB (ref 65–99)
GLUCOSE-CAPILLARY: 160 mg/dL — AB (ref 65–99)
GLUCOSE-CAPILLARY: 160 mg/dL — AB (ref 65–99)
GLUCOSE-CAPILLARY: 183 mg/dL — AB (ref 65–99)
Glucose-Capillary: 182 mg/dL — ABNORMAL HIGH (ref 65–99)

## 2015-06-22 LAB — URINE CULTURE

## 2015-06-22 LAB — HEMOGLOBIN A1C
Hgb A1c MFr Bld: 8.1 % — ABNORMAL HIGH (ref 4.8–5.6)
Mean Plasma Glucose: 186 mg/dL

## 2015-06-22 MED ORDER — SODIUM CHLORIDE 0.9 % IV SOLN
INTRAVENOUS | Status: AC
  Administered 2015-06-22 (×2): via INTRAVENOUS

## 2015-06-22 NOTE — Progress Notes (Signed)
PROGRESS NOTE  Leslie Mccann  [id number] DOB: 1945-05-31  DOA: 06/20/2015 PCP: Sheela Stack, MD  Outpatient Specialists:  Neurology: Dr. Star Age  Brief Narrative:  61 ? 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 drug-induced parkinsonism,  recent outpatient issues with hyponatremia and was on fluid restricted diet (32 ounces per day),   1 week history of feeling lightheaded, mildly nauseous, decreased oral intake, went to see her PCP on 06/20/15 and sustained a brief/~5 seconds of syncopal episode in the PCPs office.  Recent hospitalization at [facility] for hyponatremia/sodium in the 120s, had single episode of seizure attributed to low sodium but eventually taken off antiepileptics after 2 subsequent EEGs were normal.   Per EMS, EKG unremarkable. Admitted for syncope, likely from orthostatic hypotension related to fluid restriction and dehydration.    Assessment & Plan:   Principal Problem:   Syncope and collapse Active Problems:   DM retinopathy (HCC)   Nocturnal hypoxemia   Insulin dependent diabetes mellitus (HCC)   Hyponatremia   Hypothyroidism   Seizures (HCC)   Orthostatic hypotension   DM type 1 causing eye disease ([place])   Syncope, POTS - Likely related to dehydration from fluid restriction for hyponatremia/? SIADH. - On admission, BP 151/67 (lying) >109/68 (standing) - gently hydrate with IV fluids--100cc/hr-->50cc/hr 5/10  Follow orthostatic blood pressures daily. - Telemetry: Sinus rhythm without arrhythmias-d/c tele - Repeat 2-D echo: Pending. As per admitting M.D.: Echocardiogram recently showed normal EF and grade 1 diastolic heart failure and? Aortic stenosis & EKG unremarkable - No seizure-like activity reported. - Secondary to intravascular volume depletion. Continue additional 24 hours of IV fluids  Hyponatremia, hyperkalemia on admit  and hypotension -could this have been Addison's? -  managed for SIADH as outpatient - Was on fluid restriction 32 ounces of liquids per day. - Recently hospitalized at [facility] for sodium in the 120s. - Admitting sodium of 131--- Sodium improved from 131 > 134>>139 - We will request office records from PCP. - TSH normal -start Salt tabs and force 2 liters water as OP -consider free Cortisol as Op per PCP-Patient does try to self-regulate her weight "for vanity reasons" per her own admission -Note patient also on Cymbalta, which in theory can cause hyponatremia -family understands On admission had mild hyperkalemia - Unclear etiology. Creatinine normal. Resolved.  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 - Hemoglobin A1c 8.7 as per admitting MDs note. - As per spouse, brittle diabetes. Received Lantus 8 units at approximately 2 AM today. CBGs in the 200s. - Discussed with pharmacy, okay to change Lantus to Tresiba 8 units at bedtime. Spouse has patient's home supply. Continue NovoLog sensitive SSI. Monitor closely  Hypothyroid - Continue Synthroid. TSH normal.  Anxiety & depression - Continue Cymbalta and Ativan.  Nocturnal hypoxemia - Continue nighttime oxygen.  Seizure - As per spouse, patient had an episode of "focal seizures" while she was hyponatremic and admitted at [facility]. Upon advised by PCP, has stopped all antiepileptics. Subsequent to EEGs apparently normal. Follows with neurology as outpatient.  Hyperlipidemia  Asymptomatic bacteriuria - Denies dysuria, suprapubic pain, fever or chills. Some urinary frequency.   DVT prophylaxis: Lovenox Code Status: Full Family Communication: Discussed extensively with patient's spouse at bedside. Updated 5/10 Disposition Plan: PT evaluation. DC home when medically stable.   Consultants:  None  Procedures:   None  Antimicrobials:   None      Subjective:  Less dizzy Worked well with therapy nno n/v Tol 75% of Meal No cP No SOB No n/v  Objective:  Filed Vitals:   06/21/15 1254 06/21/15 1314 06/21/15 2150 06/22/15 0459  BP:  125/71 139/77 137/72  Pulse:  92 80 78  Temp:   99 F (37.2 C) 98 F (36.7 C)  TempSrc:      Resp:   16 15  Height:      Weight:    55.9 kg (123 lb 3.8 oz)  SpO2: 97%  97% 98%    Intake/Output Summary (Last 24 hours) at 06/22/15 1324 Last data filed at 06/22/15 0659  Gross per 24 hour  Intake 1471.66 ml  Output      0 ml  Net 1471.66 ml   Filed Weights   06/20/15 2059 06/22/15 0459  Weight: 53.887 kg (118 lb 12.8 oz) 55.9 kg (123 lb 3.8 oz)    Examination:  General exam: Pleasant elderly female sitting up comfortably in bed. Does not look septic or toxic. Oral mucosa moist. Reduced skin turgor. Respiratory system: Clear to auscultation. Respiratory effort normal. Cardiovascular system: S1 & S2 heard, RRR. No JVD, murmurs, rubs, gallops or clicks. No pedal edema. Telemetry: Sinus rhythm, no arrythmia Gastrointestinal system: Abdomen is nondistended, soft and nontender. No organomegaly or masses felt. Normal bowel sounds heard. Central nervous system: Alert and oriented. No focal neurological deficits. Extremities: Symmetric 5 x 5 power. Skin: No rashes, lesions or ulcers Psychiatry: Judgement and insight appear normal. Mood & affect appropriate.     Data Reviewed: I have personally reviewed following labs and imaging studies  CBC:  Recent Labs Lab 06/20/15 1538  WBC 6.2  NEUTROABS 4.6  HGB 12.4  HCT 34.8*  MCV 97.5  PLT [id number]*   Basic Metabolic Panel:  Recent Labs Lab 06/20/15 1538 06/21/15 1135 06/22/15 0528  NA 131* 134* 139  K 5.3* 4.1 3.7  CL 98* 101 104  CO2 23 24 26   GLUCOSE 158* 235* 156*  BUN 15 9 <5*  CREATININE 0.71 0.71 0.47  CALCIUM 8.6* 8.5* 8.4*   GFR: Estimated Creatinine Clearance: 57.7 mL/min (by C-G formula based on Cr of  0.47). Liver Function Tests: No results for input(s): AST, ALT, ALKPHOS, BILITOT, PROT, ALBUMIN in the last 168 hours. No results for input(s): LIPASE, AMYLASE in the last 168 hours. No results for input(s): AMMONIA in the last 168 hours. Coagulation Profile: No results for input(s): INR, PROTIME in the last 168 hours. Cardiac Enzymes:  Recent Labs Lab 06/20/15 2359 06/21/15 0554 06/21/15 1135  TROPONINI <0.03 <0.03 <0.03   BNP (last 3 results) No results for input(s): PROBNP in the last 8760 hours. HbA1C:  Recent Labs  06/21/15 0554  HGBA1C 8.1*   CBG:  Recent Labs Lab 06/21/15 1743 06/21/15 2149 06/22/15 0510 06/22/15 0759 06/22/15 1158  GLUCAP 285* 244* 155* 160* 160*   Lipid Profile: No results for input(s): CHOL, HDL, LDLCALC, TRIG, CHOLHDL, LDLDIRECT in the last 72 hours. Thyroid Function Tests:  Recent Labs  06/21/15 0554  TSH 1.591   Anemia Panel: No results for input(s): VITAMINB12, FOLATE, FERRITIN, TIBC, IRON, RETICCTPCT in the last 72 hours.  Sepsis Labs: No results for input(s): PROCALCITON, LATICACIDVEN in the last 168 hours.  Recent Results (from the past 240 hour(s))  Urine culture     Status: Abnormal   Collection Time: 06/20/15  3:57 PM  Result Value Ref Range Status   Specimen Description URINE, CLEAN CATCH  Final   Special Requests NONE  Final   Culture MULTIPLE SPECIES PRESENT, SUGGEST RECOLLECTION (A)  Final   Report Status 06/22/2015 FINAL  Final         Radiology Studies: X-ray Chest Pa And Lateral  06/21/2015  CLINICAL DATA:  70 year old female with hyponatremia and syncope. EXAM: CHEST  2 VIEW COMPARISON:  Chest radiograph dated 09/21/2012 FINDINGS: Two views of the chest demonstrate emphysematous changes of the lungs with increased AP diameter of the thoracic cage. There is no focal consolidation, pleural effusion, or pneumothorax. The cardiac silhouette is within normal limits. No acute osseous pathology. IMPRESSION: No  active cardiopulmonary disease. Electronically Signed   By: Anner Crete M.D.   On: 06/21/2015 02:47        Scheduled Meds: . aspirin  325 mg Oral Daily  . DULoxetine  40 mg Oral Daily  . enoxaparin (LOVENOX) injection  40 mg Subcutaneous QHS  . insulin aspart  0-5 Units Subcutaneous QHS  . insulin aspart  0-9 Units Subcutaneous TID WC  . insulin aspart  2 Units Subcutaneous TID WC  . Insulin Degludec  8 Units Subcutaneous QHS  . levothyroxine  100 mcg Oral QAC breakfast  . LORazepam  0.5 mg Oral BID  . raloxifene  60 mg Oral QHS  . sodium chloride flush  3 mL Intravenous Q12H   Continuous Infusions:        Time spent: 35 minutes.    Verneita Griffes, MD [facility] [phone]

## 2015-06-22 NOTE — Evaluation (Signed)
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 Syncope, likely from orthostatic hypotension related to fluid restriction and dehydration.  Clinical Impression  Pt admitted with above diagnosis. Pt currently with functional limitations due to the deficits listed below (see PT Problem List).  Pt will benefit from skilled PT to increase their independence and safety with mobility to allow discharge to home with family assist.       Follow Up Recommendations Home health PT;Supervision for mobility/OOB    Equipment Recommendations  Rolling walker with 5" wheels    Recommendations for Other Services       Precautions / Restrictions Precautions Precautions: Fall Restrictions Weight Bearing Restrictions: No      Mobility  Bed Mobility               General bed mobility comments: pt up in chair upon arrival and request return to sitting.   Transfers Overall transfer level: Needs assistance Equipment used: Rolling walker (2 wheeled) Transfers: Sit to/from Stand Sit to Stand: Min guard         General transfer comment: min guard for safety. Tranfers performed from chair and toilet level   Ambulation/Gait Ambulation/Gait assistance: Min guard Ambulation Distance (Feet): 80 Feet (80 feet + 20 feet) Assistive device: Rolling walker (2 wheeled) Gait Pattern/deviations: Step-through  pattern;Decreased step length - right;Decreased step length - left Gait velocity: decreased   General Gait Details: Mild instability during ambulation but no gross loss of balance. Pt denies any feelings of dizziness or lightheadedness.   Stairs            Wheelchair Mobility    Modified Rankin (Stroke Patients Only)       Balance Overall balance assessment: Needs assistance Sitting-balance support: No upper extremity supported Sitting balance-Leahy Scale: Good     Standing balance support: During functional activity Standing balance-Leahy Scale: Fair Standing balance comment: able to stand static without loss of balance.                              Pertinent Vitals/Pain Pain Assessment: No/denies pain    Home Living Family/patient expects to be discharged to:: Private residence Living Arrangements: Spouse/significant other Available Help at Discharge: Family;Available 24 hours/day Type of Home: House Home Access: Stairs to enter Entrance Stairs-Rails: Psychiatric nurse of Steps: 3 Home Layout: One level Home Equipment: None      Prior Function Level of Independence: Independent               Hand Dominance        Extremity/Trunk Assessment               Lower Extremity Assessment: Generalized weakness         Communication   Communication: No difficulties  Cognition Arousal/Alertness: Awake/alert Behavior During Therapy: WFL for tasks assessed/performed Overall Cognitive Status: Within Functional Limits for tasks assessed  General Comments      Exercises        Assessment/Plan    PT Assessment Patient needs continued PT services  PT Diagnosis Difficulty walking;Generalized weakness   PT Problem List Decreased strength;Decreased activity tolerance;Decreased balance;Decreased mobility;Decreased knowledge of use of DME  PT Treatment Interventions DME instruction;Gait  training;Stair training;Functional mobility training;Therapeutic activities;Therapeutic exercise;Patient/family education   PT Goals (Current goals can be found in the Care Plan section) Acute Rehab PT Goals Patient Stated Goal: get home PT Goal Formulation: With patient Time For Goal Achievement: 07/06/15 Potential to Achieve Goals: Good    Frequency Min 3X/week   Barriers to discharge        Co-evaluation               End of Session Equipment Utilized During Treatment: Gait belt Activity Tolerance: Patient tolerated treatment well Patient left: in chair;with call bell/phone within reach;with chair alarm set;with family/visitor present Nurse Communication: Mobility status    Functional Assessment Tool Used: clinical judgment Functional Limitation: Mobility: Walking and moving around Mobility: Walking and Moving Around Current Status [phone]): At least 20 percent but less than 40 percent impaired, limited or restricted Mobility: Walking and Moving Around Goal Status [phone]): At least 1 percent but less than 20 percent impaired, limited or restricted    Time: [id number] PT Time Calculation (min) (ACUTE ONLY): 36 min   Charges:   PT Evaluation $PT Eval Moderate Complexity: 1 Procedure PT Treatments $Gait Training: 8-22 mins   PT G Codes:   PT G-Codes **NOT FOR INPATIENT CLASS** Functional Assessment Tool Used: clinical judgment Functional Limitation: Mobility: Walking and moving around Mobility: Walking and Moving Around Current Status [id number]): At least 20 percent but less than 40 percent impaired, limited or restricted Mobility: Walking and Moving Around Goal Status [phone]): At least 1 percent but less than 20 percent impaired, limited or restricted    Cassell Clement, PT, [place] Pager [phone] Office [phone]  06/22/2015, 1:27 PM

## 2015-06-22 NOTE — Progress Notes (Signed)
Pt 's spouse would like to find out if Neurology could be consulted for his wife. The couple are concerned that the wife's symptoms are pointing towards Parkinson's. They reported that she sees a Garment/textile technologist, at [facility], per pt's spouse. Physician sticky note left. Charge RN, Hilaria Ota was informed. Information passed on to day RN, Rachael.

## 2015-06-23 LAB — COMPREHENSIVE METABOLIC PANEL
ALK PHOS: 36 U/L — AB (ref 38–126)
ALT: 18 U/L (ref 14–54)
ANION GAP: 9 (ref 5–15)
AST: 19 U/L (ref 15–41)
Albumin: 2.9 g/dL — ABNORMAL LOW (ref 3.5–5.0)
BILIRUBIN TOTAL: 0.5 mg/dL (ref 0.3–1.2)
BUN: <5 mg/dL — ABNORMAL LOW (ref 6–20)
CALCIUM: 8.7 mg/dL — AB (ref 8.9–10.3)
CO2: 29 mmol/L (ref 22–32)
CREATININE: 0.61 mg/dL (ref 0.44–1.00)
Chloride: 104 mmol/L (ref 101–111)
GFR calc non Af Amer: >60 mL/min (ref >60)
GLUCOSE: 135 mg/dL — AB (ref 65–99)
Potassium: 3.8 mmol/L (ref 3.5–5.1)
Sodium: 142 mmol/L (ref 135–145)
TOTAL PROTEIN: 5.2 g/dL — AB (ref 6.5–8.1)

## 2015-06-23 LAB — CBC
HCT: 35.6 % — ABNORMAL LOW (ref 36.0–46.0)
HEMOGLOBIN: 12.1 g/dL (ref 12.0–15.0)
MCH: 33.5 pg (ref 26.0–34.0)
MCHC: 34 g/dL (ref 30.0–36.0)
MCV: 98.6 fL (ref 78.0–100.0)
PLATELETS: 247 10*3/uL (ref 150–400)
RBC: 3.61 MIL/uL — AB (ref 3.87–5.11)
RDW: 13.4 % (ref 11.5–15.5)
WBC: 4 10*3/uL (ref 4.0–10.5)

## 2015-06-23 LAB — GLUCOSE, CAPILLARY
Glucose-Capillary: 122 mg/dL — ABNORMAL HIGH (ref 65–99)
Glucose-Capillary: 161 mg/dL — ABNORMAL HIGH (ref 65–99)

## 2015-06-23 LAB — CORTISOL-AM, BLOOD: Cortisol - AM: 11.8 ug/dL (ref 6.7–22.6)

## 2015-06-23 MED ORDER — SODIUM CHLORIDE 1 G PO TABS: 1.0000 g | ORAL_TABLET | Freq: Every day | ORAL | Status: DC | PRN

## 2015-06-23 NOTE — Plan of Care (Signed)
Problem: Food- and Nutrition-Related Knowledge Deficit (NB-1.1) Goal: Nutrition education Formal process to instruct or train a patient/client in a skill or to impart knowledge to help patients/clients voluntarily manage or modify food choices and eating behavior to maintain or improve health. Outcome: Adequate for Discharge  RD consulted for nutrition education regarding diabetes.     Lab Results  Component Value Date    HGBA1C 8.1* 06/21/2015   Spoke with pt (who prefers to be called "Leslie Mccann") and husband, Jeneen Rinks, at bedside. Pt husband reports pt with difficult to control DM for several years. Both confirm that they are followed closely by endocrinologist Dr. Forde Dandy.  Both confirm home regimen of 8 units Tresiba q HS, 1 unit Humalog for each 15 gram carbohydrate consumed, and correction scale.   Pt husband requesting refresher on carb counting. He expresses concern over glycemic control and its effect on other health issues and medications. He has very specific questions regarding pt insulin correction scale and voiced this as his primary concern. RD deferred question to DM coordinator or endocrinologist for further clarification. Pt husband reports pt has appointment with Dr. Forde Dandy soon after discharge.   Both pt and husband were able to effectively teach back carb counting principles with this RD, including sources of carbohydrates, servings sizes, and reading food labels. Pt husband reports that they use measuring cups at home for accuracy of serving sizes as well as using a pamphlet of nutrition labels from common restaurants when they go out to eat. Reviewed pt's diet recall and pt was able to correctly carb count food consumed as well as how much insulin to administer.   RD provided "Carbohydrate Counting for People with Diabetes" handout from the Academy of Nutrition and Dietetics. Discussed different food groups and their effects on blood sugar, emphasizing carbohydrate-containing foods.  Provided list of carbohydrates and recommended serving sizes of common foods.  Discussed importance of controlled and consistent carbohydrate intake throughout the day. Provided examples of ways to balance meals/snacks and encouraged intake of high-fiber, whole grain complex carbohydrates. Teach back method used.  Expect good compliance.  Body mass index is 18.6 kg/(m^2). Pt meets criteria for normal weight range based on current BMI.  Current diet order is Carb Modified, patient is consuming approximately 75-100% of meals at this time. Labs and medications reviewed. No further nutrition interventions warranted at this time. RD contact information provided. If additional nutrition issues arise, please re-consult RD.  Zhion Pevehouse A. Jimmye Norman, RD, LDN, CDE Pager: [phone] After hours Pager: [phone]

## 2015-06-23 NOTE — Consult Note (Signed)
   [facility] CM Inpatient Consult   06/23/2015  KATIRIA ORIOL [birth year] [id number]  Referral received to assess for care management services for possible restart of care management services.    Spoke with the patient and she states, "My husband knows more about everything regarding my discharge needs." Spoke with  Jeneen Rinks via telephone, HIPAA verified,  states that the inpatient case manger set them up with [place] and he is not interested in anything else right now.  Explained the benefits of [facility] services but Jeneen Rinks states, "we will just go with [facility] for right now."  Follow up with inpatient RNCM regarding the referral as the patient has declined at this time.   For questions, please contact:  Natividad Brood, RN BSN [facility] Liaison  [phone] business mobile phone Toll free office [phone]

## 2015-06-23 NOTE — Discharge Summary (Signed)
Physician Discharge Summary  Leslie Mccann [id number] DOB: November 24, 1945 DOA: 06/20/2015  PCP: Sheela Stack, MD  Admit date: 06/20/2015 Discharge date: 06/23/2015  Time spent: 45 minutes  Recommendations for Outpatient Follow-up:  1. Would recommend an outpatient cosyntropin test to rule out causes for Addison's disease-she on admission had hyperkalemia, hypotension and had almost a classic triad-free cortisol this admission was 11.8 on day of discharge 2. I did counsel the patient to take 1 salt tablet daily when necessary for blood pressure below 100/50 on day of discharge-I previously told him to take 2 g of salt but given the acute rise in sodium from 134-142 I felt it would be excessive to give her too much salt-I did mention that they would need a basic metabolic panel at primary care physician's office in about 3-4 days and family is aware of the same we will try to coordinate an outpatient follow-up with Dr. Forde Dandy as an outpatient 3. Patient is to continue prior to admission doses of diabetes meds 4. Recommend TSH as an outpatient in 1 month 5. Patient should have coordination of psychiatry care with Jobie Quaker at [facility] and Dr. Nancy Nordmann of [facility] neurology-it appears that the patient in the past had been on Abilify which caused parkinsonian-like symptoms. We have not changed any of her psychotropic medications this admission. 6. Patient will need a walker on discharge home 7. 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 drug-induced parkinsonism, recent outpatient issues with hyponatremia and was on fluid restricted diet (32 ounces per day),  1 week history of feeling lightheaded, mildly nauseous, decreased oral intake, went to see her PCP on 06/20/15 and sustained a brief/~5 seconds of syncopal episode in the PCPs office. Recent hospitalization at [facility] for hyponatremia/sodium in the 120s, had single episode of seizure attributed to low sodium but eventually taken off antiepileptics after 2 subsequent EEGs were normal.  Per EMS, EKG unremarkable. Admitted for syncope, likely from orthostatic hypotension related to fluid restriction and dehydration  Hospital Course:  Syncope, POTS - Likely related to dehydration from fluid restriction for hyponatremia/? SIADH. - On admission, BP 151/67 (lying) >109/68 (standing) - gently hydrate with IV fluids--100cc/hr-->50cc/hr 5/10 Follow orthostatic blood pressures at home if possible - Telemetry: Sinus rhythm without arrhythmias-d/c tele - Repeat 2-D echo: Pending. As per admitting M.D.: Echocardiogram recently showed normal EF and grade 1 diastolic heart failure and? Aortic stenosis & EKG unremarkable - No seizure-like activity reported. - Secondary to intravascular volume depletion. Continue additional 24 hours of IV fluids  Hyponatremia, hyperkalemia on admit and hypotension -could this have been Addison's? - managed for SIADH as outpatient -  Was on fluid restriction 32 ounces of liquids per day. - Recently hospitalized at [facility] for sodium in the 120s. - Admitting sodium of 131--- Sodium  improved from 131 > 134>>139-->143 - We will request office records from PCP. - TSH normal -start Salt tabs 1 g daily when necessary for blood pressure below 100/50 given orthostatic component and force 2 liters water as OP - free Cortisol 11.8, consider cosyntropin test as an outpatient -as Op per PCP-Patient does try to self-regulate her weight "for vanity reasons" per her own admission -Note patient also on Cymbalta, which in theory can cause hyponatremia -family understands On admission had mild hyperkalemia - Unclear etiology. Creatinine normal. Resolved.   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 - Hemoglobin A1c 8.7 as per admitting MDs note. - As per spouse, brittle diabetes. Received Lantus 8 units at approximately 2 AM today. CBGs in the 200s. - Discussed with pharmacy, okay to change Lantus to Tresiba 8 units at bedtime. Spouse has patient's home supply. Continue NovoLog sensitive SSI. Monitor closely  Hypothyroid - Continue Synthroid. TSH normal.  Anxiety & depression - Continue Cymbalta and Ativan.  Nocturnal hypoxemia - Continue nighttime oxygen.  Seizure - As per spouse, patient had an episode of "focal seizures" while she was hyponatremic and admitted at [facility]. Upon advised by PCP, has stopped all antiepileptics. Subsequent to EEGs apparently normal. Follows with neurology as outpatient.  Hyperlipidemia  Asymptomatic bacteriuria - Denies dysuria, suprapubic pain, fever or chills. Some urinary frequency.   DVT prophylaxis: Lovenox Code Status: Full Family Communication: Discussed extensively with patient's spouse at bedside. Updated 5/11 Disposition Plan:  DC home    Consultants:   None  Procedures:   None  Antimicrobials:   None    Discharge Exam: Filed Vitals:   06/22/15 2315 06/23/15 0502  BP: 145/75 110/63  Pulse: 77 85  Temp:  97.8 F (36.6 C)  Resp:  16    General: alert, oriented, saw the patient  ambulating-a little unsteady Cardiovascular: s1 s2 no m/r/g Respiratory: clear no added sound  Discharge Instructions   Discharge Instructions    Diet - low sodium heart healthy    Complete by:  As directed      Discharge instructions    Complete by:  As directed   Patient to take 1 g salt tablets daily as needed for low blood pressures below 100/50 in the mornings I would recommend that the patient to drink 1.8 L of water standing dose daily to improve blood pressure and hydration Patient will need a TSH in about one month I would also recommend close follow-up with primary physician Dr. Forde Dandy to determine sodium level in the next 3-4 days I would recommend close follow-up with movement specialist Dr. Nancy Nordmann in the next 2 weeks to come to some consensus regarding whether she has parkinsonism or not Patient will need a TSH in about one month     Increase activity slowly    Complete by:  As directed           Current Discharge Medication List    START taking these medications   Details  sodium chloride 1 g tablet Take 1 tablet (1 g total) by mouth daily as needed. Qty: 20 tablet, Refills: 0      CONTINUE these medications which have NOT CHANGED   Details  aspirin 325 MG tablet Take 1 tablet (325 mg total) by mouth daily. Qty: 30 tablet, Refills: 0  DULoxetine (CYMBALTA) 20 MG capsule Take 40 mg by mouth daily.     glucagon (GLUCAGON EMERGENCY) 1 MG injection Inject 1 mg into the muscle once as needed.    Insulin Degludec (TRESIBA FLEXTOUCH) 100 UNIT/ML SOPN Inject 8 Units into the skin at bedtime.     insulin lispro (HUMALOG) 100 UNIT/ML injection Inject 2-7 Units into the skin 3 (three) times daily as needed for high blood sugar (CBG >180). Per sliding scale    levothyroxine (SYNTHROID, LEVOTHROID) 100 MCG tablet Take 100 mcg by mouth daily before breakfast.     LORazepam (ATIVAN) 0.5 MG tablet Take 0.5 mg by mouth 2 (two) times daily. Refills: 0    Melatonin 3 MG  TABS Take 3 mg by mouth at bedtime.    OXYGEN Inhale 2 L into the lungs at bedtime.    raloxifene (EVISTA) 60 MG tablet Take 60 mg by mouth at bedtime.     Vitamin D, Ergocalciferol, (DRISDOL) 50000 UNITS CAPS capsule Take 50,000 Units by mouth every Wednesday.  Refills: 0    !! Misc. Devices (FINGERTIP PULSE OXIMETER) MISC Use as directed Qty: 1 each, Refills: 0    !! Misc. Devices (PULSE OXIMETER) MISC 1 Device by Does not apply route as directed. Qty: 1 each, Refills: 0    ONE TOUCH ULTRA TEST test strip Refills: 0     !! - Potential duplicate medications found. Please discuss with provider.     Allergies  Allergen Reactions  . Codeine Nausea And Vomiting  . Sulfa Antibiotics Nausea And Vomiting  . Adhesive [Tape] Rash    pls use paper tape  . Latex Rash   Follow-up Information    Follow up with Sheela Stack, MD.   Specialty:  Endocrinology   Contact information:   [address] [place] [address] [phone]       Follow up with [facility].   Why:  rolling walker to be delivered @ bedside prior to discharged   Contact information:   [address] [place] [address] [phone]       Follow up with [facility].   Why:  Home Health RN, PT, OT, and Nurse Aide arranged   Contact information:   [place] [address] [phone]        The results of significant diagnostics from this hospitalization (including imaging, microbiology, ancillary and laboratory) are listed below for reference.    Significant Diagnostic Studies: X-ray Chest Pa And Lateral  06/21/2015  CLINICAL DATA:  70 year old female with hyponatremia and syncope. EXAM: CHEST  2 VIEW COMPARISON:  Chest radiograph dated 09/21/2012 FINDINGS: Two views of the chest demonstrate emphysematous changes of the lungs with increased AP diameter of the thoracic cage. There is no focal consolidation, pleural effusion, or  pneumothorax. The cardiac silhouette is within normal limits. No acute osseous pathology. IMPRESSION: No active cardiopulmonary disease. Electronically Signed   By: Anner Crete M.D.   On: 06/21/2015 02:47    Microbiology: Recent Results (from the past 240 hour(s))  Urine culture     Status: Abnormal   Collection Time: 06/20/15  3:57 PM  Result Value Ref Range Status   Specimen Description URINE, CLEAN CATCH  Final   Special Requests NONE  Final   Culture MULTIPLE SPECIES PRESENT, SUGGEST RECOLLECTION (A)  Final   Report Status 06/22/2015 FINAL  Final     Labs: Basic Metabolic Panel:  Recent Labs Lab 06/20/15 1538  06/21/15 1135 06/22/15 0528 06/23/15 0630  NA 131* 134* 139 142  K 5.3* 4.1 3.7 3.8  CL 98* 101 104 104  CO2 23 24 26 29   GLUCOSE 158* 235* 156* 135*  BUN 15 9 <5* <5*  CREATININE 0.71 0.71 0.47 0.61  CALCIUM 8.6* 8.5* 8.4* 8.7*   Liver Function Tests:  Recent Labs Lab 06/23/15 0630  AST 19  ALT 18  ALKPHOS 36*  BILITOT 0.5  PROT 5.2*  ALBUMIN 2.9*   No results for input(s): LIPASE, AMYLASE in the last 168 hours. No results for input(s): AMMONIA in the last 168 hours. CBC:  Recent Labs Lab 06/20/15 1538 06/23/15 0630  WBC 6.2 4.0  NEUTROABS 4.6  --   HGB 12.4 12.1  HCT 34.8* 35.6*  MCV 97.5 98.6  PLT 445* 247   Cardiac Enzymes:  Recent Labs Lab 06/20/15 2359 06/21/15 0554 06/21/15 1135  TROPONINI <0.03 <0.03 <0.03   BNP: BNP (last 3 results) No results for input(s): BNP in the last 8760 hours.  ProBNP (last 3 results) No results for input(s): PROBNP in the last 8760 hours.  CBG:  Recent Labs Lab 06/22/15 1158 06/22/15 1701 06/22/15 2131 06/23/15 0822 06/23/15 1216  GLUCAP 160* 182* 183* 122* 161*       Signed:  Nita Sells MD   [facility] 06/23/2015, 2:37 PM

## 2015-06-23 NOTE — Progress Notes (Signed)
Jari Sportsman to be D/C'd Home per MD order.  Discussed with the patient and all questions fully answered.  VSS, Skin clean, dry and intact without evidence of skin break down, no evidence of skin tears noted. IV catheter discontinued intact. Site without signs and symptoms of complications. Dressing and pressure applied.  An After Visit Summary was printed and given to the patient. Patient received prescription.  D/c education completed with patient/family including follow up instructions, medication list, d/c activities limitations if indicated, with other d/c instructions as indicated by MD - patient able to verbalize understanding, all questions fully answered.   Patient instructed to return to ED, call 911, or call MD for any changes in condition.   Patient to be escorted via [place], and D/C home via private auto.  L'ESPERANCE, Shuan Statzer C 06/23/2015 2:49 PM

## 2015-06-29 ENCOUNTER — Ambulatory Visit: Payer: Medicare Other | Admitting: Physical Therapy

## 2015-06-30 ENCOUNTER — Emergency Department (HOSPITAL_COMMUNITY): Payer: Medicare Other

## 2015-06-30 ENCOUNTER — Encounter (HOSPITAL_COMMUNITY): Payer: Self-pay | Admitting: Emergency Medicine

## 2015-06-30 ENCOUNTER — Emergency Department (HOSPITAL_COMMUNITY)
Admission: EM | Admit: 2015-06-30 | Discharge: 2015-06-30 | Disposition: A | Discharge status: Home / Self Care | Payer: Medicare Other | Source: Home / Self Care | Attending: Emergency Medicine | Admitting: Emergency Medicine

## 2015-06-30 DIAGNOSIS — Z794 Long term (current) use of insulin: Secondary | ICD-10-CM | POA: Insufficient documentation

## 2015-06-30 DIAGNOSIS — Z8669 Personal history of other diseases of the nervous system and sense organs: Secondary | ICD-10-CM | POA: Insufficient documentation

## 2015-06-30 DIAGNOSIS — Z79899 Other long term (current) drug therapy: Secondary | ICD-10-CM | POA: Insufficient documentation

## 2015-06-30 DIAGNOSIS — Z8673 Personal history of transient ischemic attack (TIA), and cerebral infarction without residual deficits: Secondary | ICD-10-CM | POA: Insufficient documentation

## 2015-06-30 DIAGNOSIS — F329 Major depressive disorder, single episode, unspecified: Secondary | ICD-10-CM | POA: Insufficient documentation

## 2015-06-30 DIAGNOSIS — Z91040 Latex allergy status: Secondary | ICD-10-CM

## 2015-06-30 DIAGNOSIS — Z7982 Long term (current) use of aspirin: Secondary | ICD-10-CM | POA: Insufficient documentation

## 2015-06-30 DIAGNOSIS — E119 Type 2 diabetes mellitus without complications: Secondary | ICD-10-CM

## 2015-06-30 DIAGNOSIS — F419 Anxiety disorder, unspecified: Secondary | ICD-10-CM

## 2015-06-30 DIAGNOSIS — K5909 Other constipation: Secondary | ICD-10-CM | POA: Insufficient documentation

## 2015-06-30 DIAGNOSIS — Z87891 Personal history of nicotine dependence: Secondary | ICD-10-CM

## 2015-06-30 DIAGNOSIS — J45909 Unspecified asthma, uncomplicated: Secondary | ICD-10-CM | POA: Insufficient documentation

## 2015-06-30 LAB — CBC WITH DIFFERENTIAL/PLATELET
BASOS PCT: 1 %
Basophils Absolute: 0 10*3/uL (ref 0.0–0.1)
Eosinophils Absolute: 0.1 10*3/uL (ref 0.0–0.7)
Eosinophils Relative: 2 %
HEMATOCRIT: 37.8 % (ref 36.0–46.0)
HEMOGLOBIN: 12.9 g/dL (ref 12.0–15.0)
Lymphocytes Relative: 30 %
Lymphs Abs: 1 10*3/uL (ref 0.7–4.0)
MCH: 33.5 pg (ref 26.0–34.0)
MCHC: 34.1 g/dL (ref 30.0–36.0)
MCV: 98.2 fL (ref 78.0–100.0)
MONOS PCT: 10 %
Monocytes Absolute: 0.3 10*3/uL (ref 0.1–1.0)
NEUTROS ABS: 1.9 10*3/uL (ref 1.7–7.7)
NEUTROS PCT: 57 %
Platelets: 235 10*3/uL (ref 150–400)
RBC: 3.85 MIL/uL — AB (ref 3.87–5.11)
RDW: 13.2 % (ref 11.5–15.5)
WBC: 3.2 10*3/uL — AB (ref 4.0–10.5)

## 2015-06-30 LAB — URINALYSIS, ROUTINE W REFLEX MICROSCOPIC
Bilirubin Urine: NEGATIVE
Glucose, UA: NEGATIVE mg/dL
Hgb urine dipstick: NEGATIVE
Ketones, ur: 15 mg/dL — AB
LEUKOCYTES UA: NEGATIVE
NITRITE: NEGATIVE
PH: 7.5 (ref 5.0–8.0)
Protein, ur: NEGATIVE mg/dL
SPECIFIC GRAVITY, URINE: 1.008 (ref 1.005–1.030)

## 2015-06-30 LAB — BASIC METABOLIC PANEL
ANION GAP: 9 (ref 5–15)
BUN: 10 mg/dL (ref 6–20)
CHLORIDE: 97 mmol/L — AB (ref 101–111)
CO2: 26 mmol/L (ref 22–32)
Calcium: 9 mg/dL (ref 8.9–10.3)
Creatinine, Ser: 0.69 mg/dL (ref 0.44–1.00)
GFR calc non Af Amer: >60 mL/min (ref >60)
Glucose, Bld: 191 mg/dL — ABNORMAL HIGH (ref 65–99)
POTASSIUM: 4.4 mmol/L (ref 3.5–5.1)
SODIUM: 132 mmol/L — AB (ref 135–145)

## 2015-06-30 MED ORDER — SODIUM CHLORIDE 0.9 % IV BOLUS (SEPSIS)
1000.0000 mL | Freq: Once | INTRAVENOUS | Status: AC
  Administered 2015-06-30: 1000 mL via INTRAVENOUS

## 2015-06-30 MED ORDER — SODIUM CHLORIDE 0.9 % IV SOLN
INTRAVENOUS | Status: DC
  Administered 2015-06-30: 11:00:00 via INTRAVENOUS

## 2015-06-30 NOTE — ED Notes (Addendum)
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 Due for testing next Monday. Strict I/Os and takes sodium pill in mornings to help raise her BP in morning. States she still feels dizziness upon standing. Hoping to get Addisons test today. BP for EMS sitting 140/70 HR 80 and standing 110/60 HR 100

## 2015-06-30 NOTE — Discharge Instructions (Signed)
Keep your appointment for next week to have your ACTH stim test Constipation, Adult Constipation is when a person has fewer than three bowel movements a week, has difficulty having a bowel movement, or has stools that are dry, hard, or larger than normal. As people grow older, constipation is more common. A low-fiber diet, not taking in enough fluids, and taking certain medicines may make constipation worse.  CAUSES   Certain medicines, such as antidepressants, pain medicine, iron supplements, antacids, and water pills.   Certain diseases, such as diabetes, irritable bowel syndrome (IBS), thyroid disease, or depression.   Not drinking enough water.   Not eating enough fiber-rich foods.   Stress or travel.   Lack of physical activity or exercise.   Ignoring the urge to have a bowel movement.   Using laxatives too much.  SIGNS AND SYMPTOMS   Having fewer than three bowel movements a week.   Straining to have a bowel movement.   Having stools that are hard, dry, or larger than normal.   Feeling full or bloated.   Pain in the lower abdomen.   Not feeling relief after having a bowel movement.  DIAGNOSIS  Your health care provider will take a medical history and perform a physical exam. Further testing may be done for severe constipation. Some tests may include:  A barium enema X-ray to examine your rectum, colon, and, sometimes, your small intestine.   A sigmoidoscopy to examine your lower colon.   A colonoscopy to examine your entire colon. TREATMENT  Treatment will depend on the severity of your constipation and what is causing it. Some dietary treatments include drinking more fluids and eating more fiber-rich foods. Lifestyle treatments may include regular exercise. If these diet and lifestyle recommendations do not help, your health care provider may recommend taking over-the-counter laxative medicines to help you have bowel movements. Prescription medicines  may be prescribed if over-the-counter medicines do not work.  HOME CARE INSTRUCTIONS   Eat foods that have a lot of fiber, such as fruits, vegetables, whole grains, and beans.  Limit foods high in fat and processed sugars, such as french fries, hamburgers, cookies, candies, and soda.   A fiber supplement may be added to your diet if you cannot get enough fiber from foods.   Drink enough fluids to keep your urine clear or pale yellow.   Exercise regularly or as directed by your health care provider.   Go to the restroom when you have the urge to go. Do not hold it.   Only take over-the-counter or prescription medicines as directed by your health care provider. Do not take other medicines for constipation without talking to your health care provider first.  [place] IF:   You have bright red blood in your stool.   Your constipation lasts for more than 4 days or gets worse.   You have abdominal or rectal pain.   You have thin, pencil-like stools.   You have unexplained weight loss. MAKE SURE YOU:   Understand these instructions.  Will watch your condition.  Will get help right away if you are not doing well or get worse.   This information is not intended to replace advice given to you by your health care provider. Make sure you discuss any questions you have with your health care provider.   Document Released: 10/28/2003 Document Revised: 02/19/2014 Document Reviewed: 11/10/2012 Elsevier Interactive Patient Education 2016 Elsevier Inc.  High-Fiber Diet Fiber, also called dietary fiber, is  a type of carbohydrate found in fruits, vegetables, whole grains, and beans. A high-fiber diet can have many health benefits. Your health care provider may recommend a high-fiber diet to help:  Prevent constipation. Fiber can make your bowel movements more regular.  Lower your cholesterol.  Relieve hemorrhoids, uncomplicated diverticulosis, or irritable  bowel syndrome.  Prevent overeating as part of a weight-loss plan.  Prevent heart disease, type 2 diabetes, and certain cancers. WHAT IS MY PLAN? The recommended daily intake of fiber includes:  38 grams for men under age 55.  42 grams for men over age 92.  41 grams for women under age 50.  67 grams for women over age 29. You can get the recommended daily intake of dietary fiber by eating a variety of fruits, vegetables, grains, and beans. Your health care provider may also recommend a fiber supplement if it is not possible to get enough fiber through your diet. WHAT DO I NEED TO KNOW ABOUT A HIGH-FIBER DIET?  Fiber supplements have not been widely studied for their effectiveness, so it is better to get fiber through food sources.  Always check the fiber content on thenutrition facts label of any prepackaged food. Look for foods that contain at least 5 grams of fiber per serving.  Ask your dietitian if you have questions about specific foods that are related to your condition, especially if those foods are not listed in the following section.  Increase your daily fiber consumption gradually. Increasing your intake of dietary fiber too quickly may cause bloating, cramping, or gas.  Drink plenty of water. Water helps you to digest fiber. WHAT FOODS CAN I EAT? Grains Whole-grain breads. Multigrain cereal. Oats and oatmeal. Brown rice. Barley. Bulgur wheat. [place]. Bran muffins. Popcorn. Rye wafer crackers. Vegetables Sweet potatoes. Spinach. Kale. Artichokes. Cabbage. Broccoli. [place] peas. Carrots. Squash. Fruits Berries. Pears. Apples. Oranges. Avocados. Prunes and raisins. Dried figs. Meats and Other Protein Sources [place], kidney, pinto, and soy beans. Split peas. Lentils. Nuts and seeds. Dairy Fiber-fortified yogurt. Beverages Fiber-fortified soy milk. Fiber-fortified orange juice. Other Fiber bars. The items listed above may not be a complete list of recommended foods or  beverages. Contact your dietitian for more options. WHAT FOODS ARE NOT RECOMMENDED? Grains White bread. Pasta made with refined flour. White rice. Vegetables Fried potatoes. Canned vegetables. Well-cooked vegetables.  Fruits Fruit juice. Cooked, strained fruit. Meats and Other Protein Sources Fatty cuts of meat. Fried Sales executive or fried fish. Dairy Milk. Yogurt. Cream cheese. Sour cream. Beverages Soft drinks. Other Cakes and pastries. Butter and oils. The items listed above may not be a complete list of foods and beverages to avoid. Contact your dietitian for more information. WHAT ARE SOME TIPS FOR INCLUDING HIGH-FIBER FOODS IN MY DIET?  Eat a wide variety of high-fiber foods.  Make sure that half of all grains consumed each day are whole grains.  Replace breads and cereals made from refined flour or white flour with whole-grain breads and cereals.  Replace white rice with brown rice, bulgur wheat, or millet.  Start the day with a breakfast that is high in fiber, such as a cereal that contains at least 5 grams of fiber per serving.  Use beans in place of meat in soups, salads, or pasta.  Eat high-fiber snacks, such as berries, raw vegetables, nuts, or popcorn.   This information is not intended to replace advice given to you by your health care provider. Make sure you discuss any questions you have with your health  care provider.   Document Released: 01/29/2005 Document Revised: 02/19/2014 Document Reviewed: 07/14/2013 Elsevier Interactive Patient Education [employer].

## 2015-06-30 NOTE — ED Provider Notes (Signed)
CSN: [id number]     Arrival date & time 06/30/15  1028 History   First MD Initiated Contact with Patient 06/30/15 1044     Chief Complaint  Patient presents with  . Dehydration     (Consider location/radiation/quality/duration/timing/severity/associated sxs/prior Treatment) HPI Comments: Patient here after becoming dizzy lightheaded when she went to stand up this morning. 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 She was also found to be hyponatremic here and has been told to take salt tablets when necessary when she feels dizzy. Denies any fever, vomiting, diarrhea, black stools, chest discomfort or shortness of breath. Denies passing out today. No palpitations noted. She is unsure what her blood pressure was but noted that it did decrease when she stood up. Called EMS and was transported here  The history is provided by the patient.    Past Medical History  Diagnosis Date  . Diabetes mellitus   . Depression   . Anxiety   . Retinopathy   . TIA (transient ischemic attack) 10/16/2013  . Diabetic retinopathy ([place]) 10/16/2013  . Snorings 10/16/2013  . 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 Asthma     childhood  . Tremors of nervous system    Past Surgical History  Procedure Laterality Date  . Eye surgeries     . Cataract extraction, bilateral    . Colonoscopy     Family History  Problem Relation Age of Onset  . Cancer Mother   . COPD Father   . Diabetes Father   . Hypertension Maternal Grandmother   . Stroke Maternal Grandmother   . Stroke Maternal Grandfather   . Cancer Maternal Grandfather   . Hypothyroidism Daughter    Social History  Substance Use Topics  . Smoking status: Former Smoker -- 1.00 packs/day for 10 years    Types: Cigarettes    Quit date: 02/12/1998  . Smokeless tobacco: Never Used  . Alcohol Use: 0.0 oz/week    0 Standard drinks or equivalent per week     Comment: occassional    OB  History    No data available     Review of Systems  All other systems reviewed and are negative.     Allergies  Codeine; Sulfa antibiotics; Adhesive; and Latex  Home Medications   Prior to Admission medications   Medication Sig Start Date End Date Taking? Authorizing Provider  aspirin 325 MG tablet Take 1 tablet (325 mg total) by mouth daily. Patient taking differently: Take 325 mg by mouth daily after breakfast.  04/29/15   Velvet Bathe, MD  DULoxetine (CYMBALTA) 20 MG capsule Take 40 mg by mouth daily.     Historical Provider, MD  glucagon (GLUCAGON EMERGENCY) 1 MG injection Inject 1 mg into the muscle once as needed.    Historical Provider, MD  Insulin Degludec (TRESIBA FLEXTOUCH) 100 UNIT/ML SOPN Inject 8 Units into the skin at bedtime.     Historical Provider, MD  insulin lispro (HUMALOG) 100 UNIT/ML injection Inject 2-7 Units into the skin 3 (three) times daily as needed for high blood sugar (CBG >180). Per sliding scale    Historical Provider, MD  levothyroxine (SYNTHROID, LEVOTHROID) 100 MCG tablet Take 100 mcg by mouth daily before breakfast.     Historical Provider, MD  LORazepam (ATIVAN) 0.5 MG tablet Take 0.5 mg by mouth 2 (two) times daily. 05/30/15   Historical Provider, MD  Melatonin 3 MG TABS Take 3 mg by mouth at  bedtime.    Historical Provider, MD  Misc. Devices (FINGERTIP PULSE OXIMETER) MISC Use as directed 01/19/14   Collene Gobble, MD  Misc. Devices (PULSE OXIMETER) MISC 1 Device by Does not apply route as directed. 01/29/14   Collene Gobble, MD  ONE TOUCH ULTRA TEST test strip  11/25/13   Historical Provider, MD  OXYGEN Inhale 2 L into the lungs at bedtime.    Historical Provider, MD  raloxifene (EVISTA) 60 MG tablet Take 60 mg by mouth at bedtime.     Historical Provider, MD  sodium chloride 1 g tablet Take 1 tablet (1 g total) by mouth daily as needed. 06/23/15   Nita Sells, MD  Vitamin D, Ergocalciferol, (DRISDOL) 50000 UNITS CAPS capsule Take 50,000  Units by mouth every Wednesday.  12/16/13   Historical Provider, MD   BP 114/70 mmHg  Pulse 83  Temp(Src) 98 F (36.7 C) (Oral)  Resp 20  SpO2 100% Physical Exam  Constitutional: She is oriented to person, place, and time. She appears well-developed and well-nourished.  Non-toxic appearance. No distress.  HENT:  Head: Normocephalic and atraumatic.  Eyes: Conjunctivae, EOM and lids are normal. Pupils are equal, round, and reactive to light.  Neck: Normal range of motion. Neck supple. No tracheal deviation present. No thyroid mass present.  Cardiovascular: Normal rate, regular rhythm and normal heart sounds.  Exam reveals no gallop.   No murmur heard. Pulmonary/Chest: Effort normal and breath sounds normal. No stridor. No respiratory distress. She has no decreased breath sounds. She has no wheezes. She has no rhonchi. She has no rales.  Abdominal: Soft. Normal appearance and bowel sounds are normal. She exhibits no distension. There is no tenderness. There is no rebound and no CVA tenderness.  Musculoskeletal: Normal range of motion. She exhibits no edema or tenderness.  Neurological: She is alert and oriented to person, place, and time. She has normal strength. No cranial nerve deficit or sensory deficit. GCS eye subscore is 4. GCS verbal subscore is 5. GCS motor subscore is 6.  Skin: Skin is warm and dry. No abrasion and no rash noted.  Psychiatric: She has a normal mood and affect. Her speech is normal and behavior is normal.  Nursing note and vitals reviewed.   ED Course  Procedures (including critical care time) Labs Review Labs Reviewed  CBC WITH DIFFERENTIAL/PLATELET  BASIC METABOLIC PANEL    Imaging Review No results found. I have personally reviewed and evaluated these images and lab results as part of my medical decision-making.   EKG Interpretation None      MDM   Final diagnoses:  None    Spoke with patient's primary care doctor, Dr. Forde Dandy, who recommended  that I draw an ACTH test. This is pitting at this time. At the urging of the patient, I rechecked a urinalysis which showed no infection. Her Abdominal series was without signs of obstruction and shows moderate stool throughout her colon. Patient encouraged to follow-up with her doctor.    Lacretia Leigh, MD 06/30/15 1355

## 2015-07-01 ENCOUNTER — Ambulatory Visit: Payer: Managed Care, Other (non HMO) | Admitting: Physical Therapy

## 2015-07-01 ENCOUNTER — Emergency Department (HOSPITAL_COMMUNITY): Payer: Medicare Other

## 2015-07-01 ENCOUNTER — Encounter (HOSPITAL_COMMUNITY): Payer: Self-pay | Admitting: Emergency Medicine

## 2015-07-01 ENCOUNTER — Other Ambulatory Visit (HOSPITAL_COMMUNITY): Payer: Self-pay | Admitting: *Deleted

## 2015-07-01 ENCOUNTER — Inpatient Hospital Stay (HOSPITAL_COMMUNITY)
Admission: EM | Admit: 2015-07-01 | Discharge: 2015-07-04 | DRG: 641 | Disposition: A | Discharge status: Home Health | Payer: Medicare Other | Attending: Internal Medicine | Admitting: Internal Medicine

## 2015-07-01 DIAGNOSIS — G4734 Idiopathic sleep related nonobstructive alveolar hypoventilation: Secondary | ICD-10-CM | POA: Diagnosis present

## 2015-07-01 DIAGNOSIS — R531 Weakness: Secondary | ICD-10-CM

## 2015-07-01 DIAGNOSIS — Z794 Long term (current) use of insulin: Secondary | ICD-10-CM

## 2015-07-01 DIAGNOSIS — IMO0001 Reserved for inherently not codable concepts without codable children: Secondary | ICD-10-CM

## 2015-07-01 DIAGNOSIS — R634 Abnormal weight loss: Secondary | ICD-10-CM | POA: Diagnosis present

## 2015-07-01 DIAGNOSIS — R410 Disorientation, unspecified: Secondary | ICD-10-CM

## 2015-07-01 DIAGNOSIS — E039 Hypothyroidism, unspecified: Secondary | ICD-10-CM | POA: Diagnosis not present

## 2015-07-01 DIAGNOSIS — E871 Hypo-osmolality and hyponatremia: Principal | ICD-10-CM | POA: Diagnosis present

## 2015-07-01 DIAGNOSIS — K5900 Constipation, unspecified: Secondary | ICD-10-CM | POA: Diagnosis present

## 2015-07-01 DIAGNOSIS — Z7982 Long term (current) use of aspirin: Secondary | ICD-10-CM

## 2015-07-01 DIAGNOSIS — R479 Unspecified speech disturbances: Secondary | ICD-10-CM | POA: Diagnosis present

## 2015-07-01 DIAGNOSIS — G454 Transient global amnesia: Secondary | ICD-10-CM

## 2015-07-01 DIAGNOSIS — D219 Benign neoplasm of connective and other soft tissue, unspecified: Secondary | ICD-10-CM

## 2015-07-01 DIAGNOSIS — E538 Deficiency of other specified B group vitamins: Secondary | ICD-10-CM | POA: Diagnosis present

## 2015-07-01 DIAGNOSIS — Z87891 Personal history of nicotine dependence: Secondary | ICD-10-CM

## 2015-07-01 DIAGNOSIS — E119 Type 2 diabetes mellitus without complications: Secondary | ICD-10-CM

## 2015-07-01 DIAGNOSIS — E1065 Type 1 diabetes mellitus with hyperglycemia: Secondary | ICD-10-CM | POA: Diagnosis present

## 2015-07-01 DIAGNOSIS — G459 Transient cerebral ischemic attack, unspecified: Secondary | ICD-10-CM | POA: Diagnosis present

## 2015-07-01 DIAGNOSIS — E1040 Type 1 diabetes mellitus with diabetic neuropathy, unspecified: Secondary | ICD-10-CM | POA: Diagnosis present

## 2015-07-01 DIAGNOSIS — F419 Anxiety disorder, unspecified: Secondary | ICD-10-CM | POA: Diagnosis not present

## 2015-07-01 DIAGNOSIS — R0902 Hypoxemia: Secondary | ICD-10-CM | POA: Diagnosis present

## 2015-07-01 DIAGNOSIS — F329 Major depressive disorder, single episode, unspecified: Secondary | ICD-10-CM | POA: Diagnosis present

## 2015-07-01 DIAGNOSIS — D259 Leiomyoma of uterus, unspecified: Secondary | ICD-10-CM | POA: Diagnosis present

## 2015-07-01 DIAGNOSIS — E10319 Type 1 diabetes mellitus with unspecified diabetic retinopathy without macular edema: Secondary | ICD-10-CM | POA: Diagnosis present

## 2015-07-01 DIAGNOSIS — Z79899 Other long term (current) drug therapy: Secondary | ICD-10-CM

## 2015-07-01 DIAGNOSIS — Z9981 Dependence on supplemental oxygen: Secondary | ICD-10-CM

## 2015-07-01 DIAGNOSIS — J45909 Unspecified asthma, uncomplicated: Secondary | ICD-10-CM | POA: Diagnosis present

## 2015-07-01 DIAGNOSIS — R030 Elevated blood-pressure reading, without diagnosis of hypertension: Secondary | ICD-10-CM | POA: Diagnosis present

## 2015-07-01 LAB — COMPREHENSIVE METABOLIC PANEL
ALBUMIN: 3.4 g/dL — AB (ref 3.5–5.0)
ALT: 18 U/L (ref 14–54)
AST: 19 U/L (ref 15–41)
Alkaline Phosphatase: 46 U/L (ref 38–126)
Anion gap: 9 (ref 5–15)
BILIRUBIN TOTAL: 0.8 mg/dL (ref 0.3–1.2)
BUN: 6 mg/dL (ref 6–20)
CO2: 25 mmol/L (ref 22–32)
Calcium: 8.6 mg/dL — ABNORMAL LOW (ref 8.9–10.3)
Chloride: 99 mmol/L — ABNORMAL LOW (ref 101–111)
Creatinine, Ser: 0.72 mg/dL (ref 0.44–1.00)
GFR calc Af Amer: >60 mL/min (ref >60)
GFR calc non Af Amer: >60 mL/min (ref >60)
GLUCOSE: 198 mg/dL — AB (ref 65–99)
POTASSIUM: 3.7 mmol/L (ref 3.5–5.1)
Sodium: 133 mmol/L — ABNORMAL LOW (ref 135–145)
TOTAL PROTEIN: 6 g/dL — AB (ref 6.5–8.1)

## 2015-07-01 LAB — URINALYSIS, ROUTINE W REFLEX MICROSCOPIC
Bilirubin Urine: NEGATIVE
GLUCOSE, UA: NEGATIVE mg/dL
Hgb urine dipstick: NEGATIVE
KETONES UR: 15 mg/dL — AB
LEUKOCYTES UA: NEGATIVE
Nitrite: NEGATIVE
PH: 7 (ref 5.0–8.0)
PROTEIN: NEGATIVE mg/dL
SPECIFIC GRAVITY, URINE: 1.01 (ref 1.005–1.030)

## 2015-07-01 LAB — CBC WITH DIFFERENTIAL/PLATELET
BASOS ABS: 0 10*3/uL (ref 0.0–0.1)
BASOS PCT: 0 %
Eosinophils Absolute: 0 10*3/uL (ref 0.0–0.7)
Eosinophils Relative: 1 %
HEMATOCRIT: 37.2 % (ref 36.0–46.0)
HEMOGLOBIN: 12.6 g/dL (ref 12.0–15.0)
Lymphocytes Relative: 25 %
Lymphs Abs: 1.3 10*3/uL (ref 0.7–4.0)
MCH: 32.8 pg (ref 26.0–34.0)
MCHC: 33.9 g/dL (ref 30.0–36.0)
MCV: 96.9 fL (ref 78.0–100.0)
MONO ABS: 0.5 10*3/uL (ref 0.1–1.0)
Monocytes Relative: 9 %
NEUTROS ABS: 3.3 10*3/uL (ref 1.7–7.7)
NEUTROS PCT: 65 %
Platelets: 238 10*3/uL (ref 150–400)
RBC: 3.84 MIL/uL — AB (ref 3.87–5.11)
RDW: 13.1 % (ref 11.5–15.5)
WBC: 5.1 10*3/uL (ref 4.0–10.5)

## 2015-07-01 LAB — I-STAT CHEM 8, ED
BUN: 7 mg/dL (ref 6–20)
CALCIUM ION: 1.11 mmol/L — AB (ref 1.13–1.30)
CHLORIDE: 96 mmol/L — AB (ref 101–111)
Creatinine, Ser: 0.6 mg/dL (ref 0.44–1.00)
GLUCOSE: 188 mg/dL — AB (ref 65–99)
HCT: 42 % (ref 36.0–46.0)
Hemoglobin: 14.3 g/dL (ref 12.0–15.0)
Potassium: 3.7 mmol/L (ref 3.5–5.1)
Sodium: 134 mmol/L — ABNORMAL LOW (ref 135–145)
TCO2: 22 mmol/L (ref 0–100)

## 2015-07-01 LAB — I-STAT TROPONIN, ED: TROPONIN I, POC: 0 ng/mL (ref 0.00–0.08)

## 2015-07-01 LAB — CBG MONITORING, ED: GLUCOSE-CAPILLARY: 204 mg/dL — AB (ref 65–99)

## 2015-07-01 LAB — URINE CULTURE

## 2015-07-01 LAB — GLUCOSE, CAPILLARY
GLUCOSE-CAPILLARY: 175 mg/dL — AB (ref 65–99)
Glucose-Capillary: 268 mg/dL — ABNORMAL HIGH (ref 65–99)

## 2015-07-01 LAB — ACTH: C206 ACTH: 20.5 pg/mL (ref 7.2–63.3)

## 2015-07-01 MED ORDER — ENOXAPARIN SODIUM 30 MG/0.3ML ~~LOC~~ SOLN
30.0000 mg | SUBCUTANEOUS | Status: DC
Start: 2015-07-01 — End: 2015-07-04
  Administered 2015-07-01 – 2015-07-03 (×3): 30 mg via SUBCUTANEOUS
  Filled 2015-07-01 (×3): qty 0.3

## 2015-07-01 MED ORDER — ASPIRIN EC 81 MG PO TBEC
81.0000 mg | DELAYED_RELEASE_TABLET | Freq: Every day | ORAL | Status: DC
  Administered 2015-07-02 – 2015-07-04 (×3): 81 mg via ORAL
  Filled 2015-07-01 (×3): qty 1

## 2015-07-01 MED ORDER — SODIUM CHLORIDE 0.9 % IV BOLUS (SEPSIS)
1000.0000 mL | Freq: Once | INTRAVENOUS | Status: AC
  Administered 2015-07-01: 1000 mL via INTRAVENOUS

## 2015-07-01 MED ORDER — ONDANSETRON HCL 4 MG PO TABS: 4.0000 mg | ORAL_TABLET | Freq: Four times a day (QID) | ORAL | Status: DC | PRN

## 2015-07-01 MED ORDER — LEVOTHYROXINE SODIUM 100 MCG PO TABS
100.0000 ug | ORAL_TABLET | Freq: Every day | ORAL | Status: DC
  Administered 2015-07-02 – 2015-07-04 (×3): 100 ug via ORAL
  Filled 2015-07-01 (×3): qty 1

## 2015-07-01 MED ORDER — RALOXIFENE HCL 60 MG PO TABS
60.0000 mg | ORAL_TABLET | Freq: Every day | ORAL | Status: DC
  Administered 2015-07-01 – 2015-07-03 (×3): 60 mg via ORAL
  Filled 2015-07-01 (×3): qty 1

## 2015-07-01 MED ORDER — ACETAMINOPHEN 325 MG PO TABS: 650.0000 mg | ORAL_TABLET | Freq: Four times a day (QID) | ORAL | Status: DC | PRN

## 2015-07-01 MED ORDER — SODIUM CHLORIDE 0.9 % IV SOLN
INTRAVENOUS | Status: DC
  Administered 2015-07-01 – 2015-07-04 (×5): via INTRAVENOUS

## 2015-07-01 MED ORDER — ACETAMINOPHEN 650 MG RE SUPP: 650.0000 mg | Freq: Four times a day (QID) | RECTAL | Status: DC | PRN

## 2015-07-01 MED ORDER — COSYNTROPIN 0.25 MG IJ SOLR
0.2500 mg | Freq: Once | INTRAMUSCULAR | Status: AC
  Administered 2015-07-02: 0.25 mg via INTRAVENOUS
  Filled 2015-07-01 (×2): qty 0.25

## 2015-07-01 MED ORDER — CYANOCOBALAMIN 1000 MCG/ML IJ SOLN
1000.0000 ug | Freq: Every day | INTRAMUSCULAR | Status: AC
  Administered 2015-07-01 – 2015-07-03 (×3): 1000 ug via INTRAMUSCULAR
  Filled 2015-07-01 (×3): qty 1

## 2015-07-01 MED ORDER — INSULIN DEGLUDEC 100 UNIT/ML ~~LOC~~ SOPN
8.0000 [IU] | PEN_INJECTOR | Freq: Every day | SUBCUTANEOUS | Status: DC
  Administered 2015-07-01 – 2015-07-03 (×3): 8 [IU] via SUBCUTANEOUS

## 2015-07-01 MED ORDER — LORAZEPAM 0.5 MG PO TABS
0.5000 mg | ORAL_TABLET | Freq: Two times a day (BID) | ORAL | Status: DC
  Administered 2015-07-01 – 2015-07-04 (×6): 0.5 mg via ORAL
  Filled 2015-07-01 (×6): qty 1

## 2015-07-01 MED ORDER — BISACODYL 5 MG PO TBEC
5.0000 mg | DELAYED_RELEASE_TABLET | Freq: Every day | ORAL | Status: DC | PRN
  Administered 2015-07-03: 5 mg via ORAL
  Filled 2015-07-01: qty 1

## 2015-07-01 MED ORDER — DULOXETINE HCL 20 MG PO CPEP
40.0000 mg | ORAL_CAPSULE | Freq: Every day | ORAL | Status: DC
  Administered 2015-07-02 – 2015-07-04 (×3): 40 mg via ORAL
  Filled 2015-07-01 (×3): qty 2

## 2015-07-01 MED ORDER — POLYETHYLENE GLYCOL 3350 17 G PO PACK: 17.0000 g | PACK | Freq: Every day | ORAL | Status: DC | PRN

## 2015-07-01 MED ORDER — ONDANSETRON HCL 4 MG/2ML IJ SOLN
4.0000 mg | Freq: Four times a day (QID) | INTRAMUSCULAR | Status: DC | PRN
Start: 2015-07-01 — End: 2015-07-04

## 2015-07-01 MED ORDER — INSULIN ASPART 100 UNIT/ML ~~LOC~~ SOLN
0.0000 [IU] | Freq: Three times a day (TID) | SUBCUTANEOUS | Status: DC
  Administered 2015-07-03: 1 [IU] via SUBCUTANEOUS
  Administered 2015-07-03: 5 [IU] via SUBCUTANEOUS

## 2015-07-01 NOTE — ED Notes (Signed)
MD at bedside. 

## 2015-07-01 NOTE — Progress Notes (Signed)
Patient trasfered from ED to 5W29 via bed; alert and oriented x 3; no complaints of pain; IV saline locked in LFA; skin intact; family at bedside. Fluid IV were started per MD order. Orient patient to room and unit; gave patient care guide; instructed how to use the call bell and  fall risk precautions. Will continue to monitor the patient.

## 2015-07-01 NOTE — H&P (Signed)
History and Physical    Leslie Mccann [id number] DOB: 1946/01/19 DOA: 07/01/2015  PCP: Sheela Stack, MD  Patient coming from:  Home  Chief Complaint: altered mental status changes, lightheadedness  HPI: Leslie Mccann is a 70 y.o. 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 Patient has been hospitalized 3 times ([facility] and [facility]) since March for weakness, confusion. Per husband she is on a 1.5 fluid restriction daily and salt pills as needed for low sodium / hypotension. Husband and daughter very concerned about intermittent hyponatremia and now weight loss and profound weakness.     Extensive record review in Epic shows that patient previously evaluated by Neurology for gait disorder and recurrent falls,  Seizures, possible TIA, and sleeping problems. Sleep study normal except for nighttime hypoxemia. Patient evaluated by pulmonologist, Dr. Lamonte Sakai November 2015, started on 02 at night. There was concern that her psychiatric medications might be suppressing her respiratory drive. PFTs unremarkable. No intrinsic lung disease found.   Patient admitted [facility] in March of this year for confusion. Brain MRI negative for acute findings. EEG was abnormal, she was started on antiepileptic medication which had to be stopped due to tremors and lethargy. Apparently patient had a repeat EEG which was normal, she was taken off seizure medications.  Patient was recently admitted to [facility]  with, according to our last discharge summary 06/23/15 , a sodium in the 120s. TSH was apparently normal. Then readmitted to [facility] earlier this month for lightheadedness, poor appetite and syncope felt to be secondary to dehydration. There was question of Addison's as well.  Patient in ED today with dizziness, recurrent problems with speech, weakness which husband believes is secondary to low sodium.  Took salt pill this morning for low BP.  On 1.5 liter fluid restriction and salt pills as needed depending on BP. Family frustrated at lack of diagnosis  ED Course:  1 liter of NS bolus Na 134   Past Medical History  Diagnosis Date  . Diabetes mellitus   . Depression   . Anxiety   . Retinopathy   . TIA (transient ischemic attack) 10/16/2013  . Diabetic retinopathy ([place]) 10/16/2013  . Snorings 10/16/2013  . 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 Asthma     childhood  . Tremors of nervous system     Past Surgical History  Procedure Laterality Date  . Eye surgeries     . Cataract extraction, bilateral    . Colonoscopy      ROS  Positive for frequent urination without dysuria. Positive for constipation. All other review of systems in the 10 point review were negative unless stated in the history of present illness    reports that she quit smoking about 17 years ago. Her smoking use included Cigarettes. She has a 10 pack-year smoking history. She has never used smokeless tobacco. She reports that she drinks alcohol. She reports that she does not use illicit drugs.  Allergies  Allergen Reactions  . Ciprofloxacin Hcl Other (See Comments)    intolerance  . Codeine Nausea And Vomiting  . Sulfa Antibiotics Nausea And Vomiting  . Adhesive [Tape] Rash    pls use paper tape  . Latex Rash    Family History  Problem Relation Age of Onset  . Cancer Mother   . COPD Father   . Diabetes Father   . Hypertension Maternal Grandmother   . Stroke Maternal Grandmother   .  Stroke Maternal Grandfather   . Cancer Maternal Grandfather   . Hypothyroidism Daughter     Prior to Admission medications   Medication Sig Start Date End Date Taking? Authorizing Provider  aspirin 81 MG tablet Take 81 mg by mouth daily.   Yes Historical Provider, MD  DULoxetine (CYMBALTA) 20 MG capsule Take 40 mg by mouth daily.    Yes Historical Provider, MD  glucagon (GLUCAGON EMERGENCY) 1 MG injection Inject 1 mg into the  muscle once as needed.   Yes Historical Provider, MD  Insulin Degludec (TRESIBA FLEXTOUCH) 100 UNIT/ML SOPN Inject 8 Units into the skin at bedtime.    Yes Historical Provider, MD  insulin lispro (HUMALOG) 100 UNIT/ML injection Inject 2-7 Units into the skin 3 (three) times daily as needed for high blood sugar (CBG >180). Per sliding scale   Yes Historical Provider, MD  levothyroxine (SYNTHROID, LEVOTHROID) 100 MCG tablet Take 100 mcg by mouth daily before breakfast.    Yes Historical Provider, MD  LORazepam (ATIVAN) 0.5 MG tablet Take 0.5 mg by mouth 2 (two) times daily. 05/30/15  Yes Historical Provider, MD  Misc. Devices (FINGERTIP PULSE OXIMETER) MISC Use as directed 01/19/14  Yes Collene Gobble, MD  Misc. Devices (PULSE OXIMETER) MISC 1 Device by Does not apply route as directed. 01/29/14  Yes Collene Gobble, MD  ONE TOUCH ULTRA TEST test strip  11/25/13  Yes Historical Provider, MD  OXYGEN Inhale 2 L into the lungs at bedtime.   Yes Historical Provider, MD  raloxifene (EVISTA) 60 MG tablet Take 60 mg by mouth at bedtime.    Yes Historical Provider, MD  sodium chloride 1 g tablet Take 1 tablet (1 g total) by mouth daily as needed. Patient taking differently: Take 1 g by mouth daily as needed (decreased BP).  06/23/15  Yes Nita Sells, MD  Vitamin D, Ergocalciferol, (DRISDOL) 50000 UNITS CAPS capsule Take 50,000 Units by mouth every Wednesday.  12/16/13  Yes Historical Provider, MD  aspirin 325 MG tablet Take 1 tablet (325 mg total) by mouth daily. Patient not taking: Reported on 06/30/2015 04/29/15   Velvet Bathe, MD    Physical Exam: Filed Vitals:   07/01/15 1400 07/01/15 1415 07/01/15 1523 07/01/15 1530  BP: 184/81 178/87 180/112 185/85  Pulse: 89 89 84 85  Temp:      TempSrc:      Resp: 17 20 15 21   SpO2: 100% 100% 99% 97%    Constitutional: Thin white female comfortable in bed comfortable Filed Vitals:   07/01/15 1400 07/01/15 1415 07/01/15 1523 07/01/15 1530  BP: 184/81  178/87 180/112 185/85  Pulse: 89 89 84 85  Temp:      TempSrc:      Resp: 17 20 15 21   SpO2: 100% 100% 99% 97%   Eyes: PER, lids and conjunctivae normal ENMT: Mucous membranes are moist. Posterior pharynx clear of any exudate or lesions.Normal dentition.  Neck: normal, supple, no masses Respiratory: clear to auscultation bilaterally, no wheezing, no crackles. Normal respiratory effort. No accessory muscle use.  Cardiovascular: Regular rate and rhythm, no murmurs / rubs / gallops. No extremity edema. 2+ pedal pulses. No carotid bruits.  Abdomen: no tenderness, no masses palpated. No hepatomegaly. Bowel sounds positive.  Musculoskeletal: no clubbing / cyanosis. No joint deformity upper and lower extremities. Good ROM, no contractures. Normal muscle tone.  Skin: no rashes, lesions, ulcers.  Neurologic: CN 2-12 grossly intact. Sensation intact, Strength 5/5 in all 4.  Psychiatric: Normal judgment  and insight. Alert and oriented x 3. Normal mood.    Labs on Admission: I have personally reviewed following labs and imaging studies  CBC:  Recent Labs Lab 06/30/15 1050 07/01/15 1453 07/01/15 1512  WBC 3.2* 5.1  --   NEUTROABS 1.9 3.3  --   HGB 12.9 12.6 14.3  HCT 37.8 37.2 42.0  MCV 98.2 96.9  --   PLT 235 238  --    Basic Metabolic Panel:  Recent Labs Lab 06/30/15 1050 07/01/15 1453 07/01/15 1512  NA 132* 133* 134*  K 4.4 3.7 3.7  CL 97* 99* 96*  CO2 26 25  --   GLUCOSE 191* 198* 188*  BUN 10 6 7   CREATININE 0.69 0.72 0.60  CALCIUM 9.0 8.6*  --    GFR: Estimated Creatinine Clearance: 56 mL/min (by C-G formula based on Cr of 0.6). Liver Function Tests:  Recent Labs Lab 07/01/15 1453  AST 19  ALT 18  ALKPHOS 46  BILITOT 0.8  PROT 6.0*  ALBUMIN 3.4*   Urine analysis:    Component Value Date/Time   COLORURINE YELLOW 07/01/2015 1422   [place] 07/01/2015 1422   LABSPEC 1.010 07/01/2015 1422   PHURINE 7.0 07/01/2015 1422   GLUCOSEU NEGATIVE  07/01/2015 1422   HGBUR NEGATIVE 07/01/2015 1422   BILIRUBINUR NEGATIVE 07/01/2015 1422   KETONESUR 15* 07/01/2015 1422   PROTEINUR NEGATIVE 07/01/2015 1422   UROBILINOGEN 1.0 09/21/2012 0129   NITRITE NEGATIVE 07/01/2015 1422   LEUKOCYTESUR NEGATIVE 07/01/2015 1422     Recent Results (from the past 240 hour(s))  Urine culture     Status: Abnormal   Collection Time: 06/30/15 12:54 PM  Result Value Ref Range Status   Specimen Description URINE, RANDOM  Final   Special Requests NONE  Final   Culture MULTIPLE SPECIES PRESENT, SUGGEST RECOLLECTION (A)  Final   Report Status 07/01/2015 FINAL  Final     Radiological Exams on Admission: Dg Chest 2 View  07/01/2015  CLINICAL DATA:  Altered mental status. EXAM: CHEST  2 VIEW COMPARISON:  Jun 30, 2015. FINDINGS: The heart size and mediastinal contours are within normal limits. Both lungs are clear. No pneumothorax or pleural effusion is noted. The visualized skeletal structures are unremarkable. IMPRESSION: No active cardiopulmonary disease. Electronically Signed   By: Marijo Conception, M.D.   On: 07/01/2015 14:41   Ct Head Wo Contrast  07/01/2015  CLINICAL DATA:  Confusion/altered mental status EXAM: CT HEAD WITHOUT CONTRAST TECHNIQUE: Contiguous axial images were obtained from the base of the skull through the vertex without intravenous contrast. COMPARISON:  Head CT April 27, 2015; brain MRI April 28, 2015 FINDINGS: Age related volume loss is stable. There is no intracranial mass, hemorrhage, extra-axial fluid collection, or midline shift. Slight small vessel disease in the centra semiovale bilaterally is stable. There is no new gray-white compartment lesion. No acute infarct evident. Bony calvarium appears intact. The mastoid air cells are clear. Orbits appear symmetric bilaterally. IMPRESSION: Age related volume loss with mild periventricular small vessel disease. No intracranial mass, hemorrhage, or evidence of acute infarct. Electronically  Signed   By: Lowella Grip III M.D.   On: 07/01/2015 15:09   Dg Abd Acute W/chest  06/30/2015  CLINICAL DATA:  Constipation and increased urinary frequency EXAM: DG ABDOMEN ACUTE W/ 1V CHEST COMPARISON:  Chest radiograph Jun 21, 2015 FINDINGS: PA chest: No edema or consolidation. Heart size and pulmonary vascularity are normal. No adenopathy. Supine and upright abdomen: There is fairly  diffuse stool throughout the colon. There is no bowel dilatation or air-fluid level suggesting obstruction. No free air. There are multiple calcified uterine leiomyomas in the pelvis. There is degenerative change in the lumbar spine with mid lumbar dextroscoliosis. 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 Bowel gas pattern unremarkable without obstruction or free air. Fairly diffuse stool throughout colon. No lung edema or consolidation. Electronically Signed   By: Lowella Grip III M.D.   On: 06/30/2015 13:34    EKG: Independently reviewed.   EKG Interpretation  Date/Time:  Friday Jul 01 2015 13:38:44 EDT Ventricular Rate:  84 PR Interval:  142 QRS Duration: 98 QT Interval:  401 QTC Calculation: 474 R Axis:   68 Text Interpretation:  Sinus rhythm No significant change since last tracing Confirmed by FLOYD MD, DANIEL [id number]) on 07/01/2015 1:48:47 PM      Assessment/Plan   Confusion, weakness, low BP at home.  Head CT today negative for acute findings.. This is her fourth admission since March for similar symptoms. In March head MRI without any acute findings. TSH was normal, B12 low normal. Patient saw PCP today, she is scheduled for Cortrosyn test in 2 weeks. Husband concerned about progressive weakness, dizziness, and weight loss at home.  -Place in observation - Medical bed -IV fluids -Will schedule Cortrosyn stimulation test for am  -B12 supplement (low normal in March). Ordered IM dose x3, needs to continue weekly as outpatient.   Weight loss. Weight 15 pounds  since March.  -CT scan chest / abd/pelvis to evaluate for underlying malignancy.                                        Constipation. No BM for several days until husband gave dulcolax and an enema last night. Passed some stool last night and this am. Doesn't take anything on regular basis except stool softener for 4 days.  -Miralax qd  Elevated BP. No history of HTN. Husband and daughter, this is very unusual for patient. Her blood pressure always runs on low side. -Monitor BP for now  Nocturnal hypoxemia. Negative workup by Pulmonary. Concern for psych meds suppressing respiratory drive. Prescribed 02 at night, not diligent in using it -02 at night  Anxiety / depression -continue home cymbalta -continue bid ativan  Hypothyroidism.  -continue home synthroid  Type I diabetes -reduce QHS Tresiba by 1/2 dose, 8 units to 4 units  - not eating much. -CBGs, SSI   DVT prophylaxis:   Lovenox  Code Status:   Full code  Family Communication:  Husband in room and daughter Webb Silversmith over speaker phone Disposition Plan: Discharge home in 24-48 hours         Consults called: None Admission status:  Observation - Medical bed    Tye Savoy NP [facility] Pager [phone]  If 7PM-7AM, please contact night-coverage [email or web address] Password [facility]  07/01/2015, 4:35 PM

## 2015-07-01 NOTE — ED Notes (Signed)
Per EMS, pt coming in from home for orthostatic changes and increased confusion starting this morning . Pts husband reports the last time that this happened it was low sodiums. Pt alert to self.

## 2015-07-01 NOTE — ED Provider Notes (Signed)
CSN: [id number]     Arrival date & time 07/01/15  1336 History   First MD Initiated Contact with Patient 07/01/15 1348     Chief Complaint  Patient presents with  . Altered Mental Status     (Consider location/radiation/quality/duration/timing/severity/associated sxs/prior Treatment) Patient is a 70 y.o. female presenting with altered mental status. The history is provided by the spouse.  Altered Mental Status Presenting symptoms: behavior changes, confusion and disorientation   Severity:  Moderate Most recent episode:  Today Duration:  12 hours Timing:  Constant Progression:  Worsening Chronicity:  Recurrent Associated symptoms: hallucinations    70 yo F With a chief complaint of altered mental status. Patient is been having this issue off and on for the past couple months. She had been admitted for hyponatremia in the past with improvement usually with fluid restriction. There is no known etiology at for the symptoms. Per the husband she was seen here yesterday with worsening confusion that improved with a fluid bolus. They went home and she had some worsening. He tried salt tablets without improvement. She is currently on a fluid restriction. The patient is unable to provide much history.  Past Medical History  Diagnosis Date  . Diabetes mellitus   . Depression   . Anxiety   . Retinopathy   . TIA (transient ischemic attack) 10/16/2013  . Diabetic retinopathy ([place]) 10/16/2013  . Snorings 10/16/2013  . 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 Asthma     childhood  . Tremors of nervous system    Past Surgical History  Procedure Laterality Date  . Eye surgeries     . Cataract extraction, bilateral    . Colonoscopy     Family History  Problem Relation Age of Onset  . Cancer Mother   . COPD Father   . Diabetes Father   . Hypertension Maternal Grandmother   . Stroke Maternal Grandmother   . Stroke Maternal Grandfather   . Cancer Maternal Grandfather   . Hypothyroidism Daughter     Social History  Substance Use Topics  . Smoking status: Former Smoker -- 1.00 packs/day for 10 years    Types: Cigarettes    Quit date: 02/12/1998  . Smokeless tobacco: Never Used  . Alcohol Use: 0.0 oz/week    0 Standard drinks or equivalent per week     Comment: occassional    OB History    No data available     Review of Systems  Unable to perform ROS: Mental status change  Psychiatric/Behavioral: Positive for hallucinations and confusion.      Allergies  Ciprofloxacin hcl; Codeine; Sulfa antibiotics; Adhesive; and Latex  Home Medications   Prior to Admission medications   Medication Sig Start Date End Date Taking? Authorizing Provider  aspirin 81 MG tablet Take 81 mg by mouth daily.   Yes Historical Provider, MD  DULoxetine (CYMBALTA) 20 MG capsule Take 40 mg by mouth daily.    Yes Historical Provider, MD  glucagon (GLUCAGON EMERGENCY) 1 MG injection Inject 1 mg into the muscle once as needed.   Yes Historical Provider, MD  Insulin Degludec (TRESIBA FLEXTOUCH) 100 UNIT/ML SOPN Inject 8 Units into the skin at bedtime.    Yes Historical Provider, MD  insulin lispro (HUMALOG) 100 UNIT/ML injection Inject 2-7 Units into the skin 3 (three) times daily as needed for high blood sugar (CBG >180). Per sliding scale   Yes Historical Provider, MD  levothyroxine (SYNTHROID, LEVOTHROID) 100 MCG tablet Take 100 mcg by mouth daily before  breakfast.    Yes Historical Provider, MD  LORazepam (ATIVAN) 0.5 MG tablet Take 0.5 mg by mouth 2 (two) times daily. 05/30/15  Yes Historical Provider, MD  Misc. Devices (FINGERTIP PULSE OXIMETER) MISC Use as directed 01/19/14  Yes Collene Gobble, MD  Misc. Devices (PULSE OXIMETER) MISC 1 Device by Does not apply route as directed. 01/29/14  Yes Collene Gobble, MD  ONE TOUCH ULTRA TEST test strip  11/25/13  Yes Historical Provider, MD  OXYGEN Inhale 2 L into the lungs at bedtime.   Yes Historical Provider, MD  raloxifene (EVISTA) 60 MG tablet Take 60  mg by mouth at bedtime.    Yes Historical Provider, MD  sodium chloride 1 g tablet Take 1 tablet (1 g total) by mouth daily as needed. Patient taking differently: Take 1 g by mouth daily as needed (decreased BP).  06/23/15  Yes Nita Sells, MD  Vitamin D, Ergocalciferol, (DRISDOL) 50000 UNITS CAPS capsule Take 50,000 Units by mouth every Wednesday.  12/16/13  Yes Historical Provider, MD  aspirin 325 MG tablet Take 1 tablet (325 mg total) by mouth daily. Patient not taking: Reported on 06/30/2015 04/29/15   Velvet Bathe, MD   BP 176/81 mmHg  Pulse 84  Temp(Src) 98.7 F (37.1 C) (Oral)  Resp 18  SpO2 100% Physical Exam  Constitutional: She appears cachectic. She appears ill. No distress.  HENT:  Head: Normocephalic and atraumatic.  Eyes: EOM are normal. Pupils are equal, round, and reactive to light.  Neck: Normal range of motion. Neck supple.  Cardiovascular: Normal rate and regular rhythm.  Exam reveals no gallop and no friction rub.   No murmur heard. Pulmonary/Chest: Effort normal. She has no wheezes. She has no rales.  Abdominal: Soft. She exhibits no distension. There is no tenderness.  Musculoskeletal: She exhibits no edema or tenderness.  Neurological: She is alert.  No focal deficits. Patient is confused talking in scattered speech.  Skin: Skin is warm and dry. She is not diaphoretic.  Psychiatric: She has a normal mood and affect. Her behavior is normal.  Nursing note and vitals reviewed.   ED Course  Procedures (including critical care time) Labs Review Labs Reviewed  CBC WITH DIFFERENTIAL/PLATELET - Abnormal; Notable for the following:    RBC 3.84 (*)    All other components within normal limits  COMPREHENSIVE METABOLIC PANEL - Abnormal; Notable for the following:    Sodium 133 (*)    Chloride 99 (*)    Glucose, Bld 198 (*)    Calcium 8.6 (*)    Total Protein 6.0 (*)    Albumin 3.4 (*)    All other components within normal limits  URINALYSIS, ROUTINE W  REFLEX MICROSCOPIC (NOT AT [facility]) - Abnormal; Notable for the following:    Ketones, ur 15 (*)    All other components within normal limits  GLUCOSE, CAPILLARY - Abnormal; Notable for the following:    Glucose-Capillary 175 (*)    All other components within normal limits  I-STAT CHEM 8, ED - Abnormal; Notable for the following:    Sodium 134 (*)    Chloride 96 (*)    Glucose, Bld 188 (*)    Calcium, Ion 1.11 (*)    All other components within normal limits  CBG MONITORING, ED - Abnormal; Notable for the following:    Glucose-Capillary 204 (*)    All other components within normal limits  BASIC METABOLIC PANEL  CBC  ACTH STIMULATION, 3 TIME POINTS  I-STAT  TROPOININ, ED    Imaging Review Dg Chest 2 View  07/01/2015  CLINICAL DATA:  Altered mental status. EXAM: CHEST  2 VIEW COMPARISON:  Jun 30, 2015. FINDINGS: The heart size and mediastinal contours are within normal limits. Both lungs are clear. No pneumothorax or pleural effusion is noted. The visualized skeletal structures are unremarkable. IMPRESSION: No active cardiopulmonary disease. Electronically Signed   By: Marijo Conception, M.D.   On: 07/01/2015 14:41   Ct Head Wo Contrast  07/01/2015  CLINICAL DATA:  Confusion/altered mental status EXAM: CT HEAD WITHOUT CONTRAST TECHNIQUE: Contiguous axial images were obtained from the base of the skull through the vertex without intravenous contrast. COMPARISON:  Head CT April 27, 2015; brain MRI April 28, 2015 FINDINGS: Age related volume loss is stable. There is no intracranial mass, hemorrhage, extra-axial fluid collection, or midline shift. Slight small vessel disease in the centra semiovale bilaterally is stable. There is no new gray-white compartment lesion. No acute infarct evident. Bony calvarium appears intact. The mastoid air cells are clear. Orbits appear symmetric bilaterally. IMPRESSION: Age related volume loss with mild periventricular small vessel disease. No intracranial mass,  hemorrhage, or evidence of acute infarct. Electronically Signed   By: Lowella Grip III M.D.   On: 07/01/2015 15:09   Dg Abd Acute W/chest  06/30/2015  CLINICAL DATA:  Constipation and increased urinary frequency EXAM: DG ABDOMEN ACUTE W/ 1V CHEST COMPARISON:  Chest radiograph Jun 21, 2015 FINDINGS: PA chest: No edema or consolidation. Heart size and pulmonary vascularity are normal. No adenopathy. Supine and upright abdomen: There is fairly diffuse stool throughout the colon. There is no bowel dilatation or air-fluid level suggesting obstruction. No free air. There are multiple calcified uterine leiomyomas in the pelvis. There is degenerative change in the lumbar spine with mid lumbar dextroscoliosis. 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 Bowel gas pattern unremarkable without obstruction or free air. Fairly diffuse stool throughout colon. No lung edema or consolidation. Electronically Signed   By: Lowella Grip III M.D.   On: 06/30/2015 13:34   I have personally reviewed and evaluated these images and lab results as part of my medical decision-making.   EKG Interpretation   Date/Time:  Friday Jul 01 2015 13:38:44 EDT Ventricular Rate:  84 PR Interval:  142 QRS Duration: 98 QT Interval:  401 QTC Calculation: 474 R Axis:   68 Text Interpretation:  Sinus rhythm No significant change since last  tracing Confirmed by Jerriyah Louis MD, Quillian Quince [id number]) on 07/01/2015 1:48:47 PM      MDM   Final diagnoses:  Confusion    70 yo F with a chief complaint of altered mental status. Family thinks this is likely secondary to undiagnosed Addison's disease. They're demanding that a ACTH stimulation test be performed. I am unsure of the patient's cause of encephalopathy. Her sodium is improved from yesterday. She does not have a urinary tract infection or pneumonia. She is afebrile here. CT of the head was negative. Will discuss with the hospitalist.  Will admit.  The  patients results and plan were reviewed and discussed.   Any x-rays performed were independently reviewed by myself.   Differential diagnosis were considered with the presenting HPI.  Medications  cyanocobalamin ((VITAMIN B-12)) injection 1,000 mcg (not administered)  aspirin EC tablet 81 mg (not administered)  LORazepam (ATIVAN) tablet 0.5 mg (not administered)  DULoxetine (CYMBALTA) DR capsule 40 mg (not administered)  raloxifene (EVISTA) tablet 60 mg (not administered)  levothyroxine (SYNTHROID, LEVOTHROID) tablet  100 mcg (not administered)  0.9 %  sodium chloride infusion ( Intravenous New Bag/Given 07/01/15 1941)  acetaminophen (TYLENOL) tablet 650 mg (not administered)    Or  acetaminophen (TYLENOL) suppository 650 mg (not administered)  polyethylene glycol (MIRALAX / GLYCOLAX) packet 17 g (not administered)  bisacodyl (DULCOLAX) EC tablet 5 mg (not administered)  ondansetron (ZOFRAN) tablet 4 mg (not administered)    Or  ondansetron (ZOFRAN) injection 4 mg (not administered)  insulin aspart (novoLOG) injection 0-9 Units (not administered)  enoxaparin (LOVENOX) injection 30 mg (not administered)  cosyntropin (CORTROSYN) injection 0.25 mg (not administered)  sodium chloride 0.9 % bolus 1,000 mL (1,000 mLs Intravenous New Bag/Given 07/01/15 1422)    Filed Vitals:   07/01/15 1745 07/01/15 1800 07/01/15 1815 07/01/15 1929  BP: 163/104 152/92 169/92 176/81  Pulse: 89 84 86 84  Temp:    98.7 F (37.1 C)  TempSrc:      Resp: 22 15 13 18   SpO2: 99% 98% 98% 100%    Final diagnoses:  Confusion    Admission/ observation were discussed with the admitting physician, patient and/or family and they are comfortable with the plan.    Deno Etienne, DO 07/01/15 1947

## 2015-07-01 NOTE — ED Notes (Signed)
Patient transported to CT 

## 2015-07-02 ENCOUNTER — Observation Stay (HOSPITAL_COMMUNITY): Payer: Medicare Other

## 2015-07-02 ENCOUNTER — Encounter (HOSPITAL_COMMUNITY): Payer: Self-pay | Admitting: Radiology

## 2015-07-02 DIAGNOSIS — E119 Type 2 diabetes mellitus without complications: Secondary | ICD-10-CM

## 2015-07-02 DIAGNOSIS — R531 Weakness: Secondary | ICD-10-CM

## 2015-07-02 DIAGNOSIS — E039 Hypothyroidism, unspecified: Secondary | ICD-10-CM

## 2015-07-02 DIAGNOSIS — R410 Disorientation, unspecified: Secondary | ICD-10-CM

## 2015-07-02 DIAGNOSIS — F419 Anxiety disorder, unspecified: Secondary | ICD-10-CM | POA: Diagnosis not present

## 2015-07-02 DIAGNOSIS — G4734 Idiopathic sleep related nonobstructive alveolar hypoventilation: Secondary | ICD-10-CM

## 2015-07-02 DIAGNOSIS — Z794 Long term (current) use of insulin: Secondary | ICD-10-CM

## 2015-07-02 DIAGNOSIS — R479 Unspecified speech disturbances: Secondary | ICD-10-CM

## 2015-07-02 LAB — BASIC METABOLIC PANEL
ANION GAP: 10 (ref 5–15)
BUN: 6 mg/dL (ref 6–20)
CHLORIDE: 104 mmol/L (ref 101–111)
CO2: 23 mmol/L (ref 22–32)
Calcium: 8.3 mg/dL — ABNORMAL LOW (ref 8.9–10.3)
Creatinine, Ser: 0.57 mg/dL (ref 0.44–1.00)
Glucose, Bld: 220 mg/dL — ABNORMAL HIGH (ref 65–99)
POTASSIUM: 4 mmol/L (ref 3.5–5.1)
SODIUM: 137 mmol/L (ref 135–145)

## 2015-07-02 LAB — GLUCOSE, CAPILLARY
GLUCOSE-CAPILLARY: 196 mg/dL — AB (ref 65–99)
Glucose-Capillary: 209 mg/dL — ABNORMAL HIGH (ref 65–99)
Glucose-Capillary: 150 mg/dL — ABNORMAL HIGH (ref 65–99)
Glucose-Capillary: 151 mg/dL — ABNORMAL HIGH (ref 65–99)

## 2015-07-02 LAB — ACTH STIMULATION, 3 TIME POINTS
Cortisol, 30 Min: 38.6 ug/dL
Cortisol, 60 Min: 42 ug/dL
Cortisol, Base: 31.4 ug/dL

## 2015-07-02 LAB — CORTISOL: CORTISOL PLASMA: 17.6 ug/dL

## 2015-07-02 LAB — CBC
HCT: 35 % — ABNORMAL LOW (ref 36.0–46.0)
HEMOGLOBIN: 11.6 g/dL — AB (ref 12.0–15.0)
MCH: 32 pg (ref 26.0–34.0)
MCHC: 33.1 g/dL (ref 30.0–36.0)
MCV: 96.4 fL (ref 78.0–100.0)
Platelets: 220 10*3/uL (ref 150–400)
RBC: 3.63 MIL/uL — AB (ref 3.87–5.11)
RDW: 13.3 % (ref 11.5–15.5)
WBC: 5.5 10*3/uL (ref 4.0–10.5)

## 2015-07-02 MED ORDER — DIATRIZOATE MEGLUMINE & SODIUM 66-10 % PO SOLN
ORAL | Status: AC
  Administered 2015-07-02: 17:00:00
  Filled 2015-07-02: qty 30

## 2015-07-02 MED ORDER — INSULIN LISPRO 100 UNIT/ML ~~LOC~~ SOLN: 2.0000 [IU] | Freq: Three times a day (TID) | SUBCUTANEOUS | Status: DC | PRN

## 2015-07-02 MED ORDER — IOPAMIDOL (ISOVUE-300) INJECTION 61%
INTRAVENOUS | Status: AC
  Administered 2015-07-02: 100 mL
  Filled 2015-07-02: qty 100

## 2015-07-02 NOTE — Progress Notes (Addendum)
Husband of patient is very pushy and does not want [place] giving his wife her insulin. CBG was 196 this AM, husband did not want her to have insulin until she ate her breakfast. Sliding scale indicates 2U and husband was advised as to how much was recommended but he gave her 3U of Humalog from home.  Lunch time CBG 209 and husband was again advised that patient needed 3U but he was adamant about not giving any insulin until she got her CT done. Informed husband that I would be documenting his administration or declination of insulin.

## 2015-07-02 NOTE — Progress Notes (Signed)
PROGRESS NOTE    Leslie Mccann  [id number] DOB: 10/24/1945 DOA: 07/01/2015 PCP: Sheela Stack, MD   Brief Narrative:  On 07/01/2015 by Ms. Tye Savoy, NP Leslie Mccann is a 70 y.o. 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 Patient has been hospitalized 3 times ([facility] and [facility]) since March for weakness, confusion. Per husband she is on a 1.5 fluid restriction daily and salt pills as needed for low sodium / hypotension. Husband and daughter very concerned about intermittent hyponatremia and now weight loss and profound weakness.   Extensive record review in Epic shows that patient previously evaluated by Neurology for gait disorder and recurrent falls, Seizures, possible TIA, and sleeping problems. Sleep study normal except for nighttime hypoxemia. Patient evaluated by pulmonologist, Dr. Lamonte Sakai November 2015, started on 02 at night. There was concern that her psychiatric medications might be suppressing her respiratory drive. PFTs unremarkable. No intrinsic lung disease found.   Patient admitted to [facility] in March of this year for confusion. Brain MRI negative for acute findings. EEG was abnormal, she was started on antiepileptic medication which had to be stopped due to tremors and lethargy. Apparently patient had a repeat EEG which was normal, she was taken off seizure medications.  Patient was recently admitted to [facility] with, according to our last discharge summary 06/23/15 , a sodium in the 120s. TSH was apparently normal. Then readmitted to [facility] earlier this month for lightheadedness, poor appetite and syncope felt to be secondary to dehydration. There was question of Addison's as well.  Patient in ED today with dizziness, recurrent problems with speech, weakness which husband believes is secondary to low sodium. Took salt pill this morning for low BP. On 1.5  liter fluid restriction and salt pills as needed depending on BP. Family frustrated at lack of diagnosis  Assessment & Plan   Confusion and generalized weakness/ Hyponatremia -Confusion resolved -Head CT today negative for acute findings. -This is her fourth admission since March for similar symptoms. In March head MRI without any acute findings.  -TSH was normal, B12 low normal.  -Continue IVF -Sodium improving  -Pending Cortrosyn stimulation  -Continue to monitor BMP -Husband concerned for pheochromocytoma (after reading about symptoms online)- will order metanephrine 24 hour urine collection   Weight loss -Weight 15 pounds since March.  -CT scan chest / abd/pelvis pending   Constipation  -Continue miralax  Elevated blood pressure -No history of hypertension per husband -BP usually runs low -Monitor BP for now  Nocturnal hypoxemia  -Negative workup by Pulmonary.  -Concern for psych meds suppressing respiratory drive.  -Prescribed 02 at night, not diligent in using it -Continue supplemental O2 QHS  Anxiety / depression -Continue cymbalta and ativan  Hypothyroidism -Continue synthroid  Type I diabetes -reduce QHS Tresiba by 1/2 dose, 8 units to 4 units - not eating much. -Continue ISS and CBG monitoring -Husband seems to think patient is in DKA- explained that patient is not- went through patient's labs with the husband  DVT Prophylaxis Lovenox  Code Status: Full  Family Communication: Husband at bedside  Disposition Plan: Admitted for observation.  Pending CT and further labs  Consultants None  Procedures  None  Antibiotics   Anti-infectives    None      Subjective:   Leslie Mccann seen and examined today.  Patient states she is feeling better. Denies chest pain, shortness of breath, abdominal pain.  Has poor appetite.  Per husband, patient's mental status has improved.    Objective:   Filed Vitals:   07/01/15  1815 07/01/15 1929 07/01/15 2114 07/02/15 0508  BP: 169/92 176/81 124/56 156/65  Pulse: 86 84 90 79  Temp:  98.7 F (37.1 C) 98.6 F (37 C) 97.8 F (36.6 C)  TempSrc:      Resp: 13 18 16 16   SpO2: 98% 100% 98% 100%    Intake/Output Summary (Last 24 hours) at 07/02/15 1353 Last data filed at 07/02/15 1216  Gross per 24 hour  Intake    960 ml  Output   1600 ml  Net   -640 ml   There were no vitals filed for this visit.  Exam  General: Well developed, thin, NAD  HEENT: NCAT, mucous membranes moist.   Cardiovascular: S1 S2 auscultated, no murmurs, RRR  Respiratory: Clear to auscultation  Abdomen: Soft, nontender, nondistended, + bowel sounds  Extremities: warm dry without cyanosis clubbing or edema  Neuro: AAOx3, nonfocal  Psych: Appropriate  Data Reviewed: I have personally reviewed following labs and imaging studies  CBC:  Recent Labs Lab 06/30/15 1050 07/01/15 1453 07/01/15 1512 07/02/15 0739  WBC 3.2* 5.1  --  5.5  NEUTROABS 1.9 3.3  --   --   HGB 12.9 12.6 14.3 11.6*  HCT 37.8 37.2 42.0 35.0*  MCV 98.2 96.9  --  96.4  PLT 235 238  --  [id number]   Basic Metabolic Panel:  Recent Labs Lab 06/30/15 1050 07/01/15 1453 07/01/15 1512 07/02/15 0739  NA 132* 133* 134* 137  K 4.4 3.7 3.7 4.0  CL 97* 99* 96* 104  CO2 26 25  --  23  GLUCOSE 191* 198* 188* 220*  BUN 10 6 7 6   CREATININE 0.69 0.72 0.60 0.57  CALCIUM 9.0 8.6*  --  8.3*   GFR: Estimated Creatinine Clearance: 56 mL/min (by C-G formula based on Cr of 0.57). Liver Function Tests:  Recent Labs Lab 07/01/15 1453  AST 19  ALT 18  ALKPHOS 46  BILITOT 0.8  PROT 6.0*  ALBUMIN 3.4*   No results for input(s): LIPASE, AMYLASE in the last 168 hours. No results for input(s): AMMONIA in the last 168 hours. Coagulation Profile: No results for input(s): INR, PROTIME in the last 168 hours. Cardiac Enzymes: No results for input(s): CKTOTAL, CKMB, CKMBINDEX, TROPONINI in the last 168 hours. BNP  (last 3 results) No results for input(s): PROBNP in the last 8760 hours. HbA1C: No results for input(s): HGBA1C in the last 72 hours. CBG:  Recent Labs Lab 07/01/15 1418 07/01/15 1941 07/01/15 2111 07/02/15 0735 07/02/15 1143  GLUCAP 204* 175* 268* 196* 209*   Lipid Profile: No results for input(s): CHOL, HDL, LDLCALC, TRIG, CHOLHDL, LDLDIRECT in the last 72 hours. Thyroid Function Tests: No results for input(s): TSH, T4TOTAL, FREET4, T3FREE, THYROIDAB in the last 72 hours. Anemia Panel: No results for input(s): VITAMINB12, FOLATE, FERRITIN, TIBC, IRON, RETICCTPCT in the last 72 hours. Urine analysis:    Component Value Date/Time   COLORURINE YELLOW 07/01/2015 [place] 07/01/2015 1422   LABSPEC 1.010 07/01/2015 1422   PHURINE 7.0 07/01/2015 1422   GLUCOSEU NEGATIVE 07/01/2015 1422   HGBUR NEGATIVE 07/01/2015 1422   BILIRUBINUR NEGATIVE 07/01/2015 1422   KETONESUR 15* 07/01/2015 1422   PROTEINUR NEGATIVE 07/01/2015 1422   UROBILINOGEN 1.0 09/21/2012 0129   NITRITE NEGATIVE 07/01/2015 1422   LEUKOCYTESUR NEGATIVE 07/01/2015 1422   Sepsis Labs: @LABRCNTIP (procalcitonin:4,lacticidven:4)  ) Recent Results (from  the past 240 hour(s))  Urine culture     Status: Abnormal   Collection Time: 06/30/15 12:54 PM  Result Value Ref Range Status   Specimen Description URINE, RANDOM  Final   Special Requests NONE  Final   Culture MULTIPLE SPECIES PRESENT, SUGGEST RECOLLECTION (A)  Final   Report Status 07/01/2015 FINAL  Final      Radiology Studies: Dg Chest 2 View  07/01/2015  CLINICAL DATA:  Altered mental status. EXAM: CHEST  2 VIEW COMPARISON:  Jun 30, 2015. FINDINGS: The heart size and mediastinal contours are within normal limits. Both lungs are clear. No pneumothorax or pleural effusion is noted. The visualized skeletal structures are unremarkable. IMPRESSION: No active cardiopulmonary disease. Electronically Signed   By: Marijo Conception, M.D.   On:  07/01/2015 14:41   Ct Head Wo Contrast  07/01/2015  CLINICAL DATA:  Confusion/altered mental status EXAM: CT HEAD WITHOUT CONTRAST TECHNIQUE: Contiguous axial images were obtained from the base of the skull through the vertex without intravenous contrast. COMPARISON:  Head CT April 27, 2015; brain MRI April 28, 2015 FINDINGS: Age related volume loss is stable. There is no intracranial mass, hemorrhage, extra-axial fluid collection, or midline shift. Slight small vessel disease in the centra semiovale bilaterally is stable. There is no new gray-white compartment lesion. No acute infarct evident. Bony calvarium appears intact. The mastoid air cells are clear. Orbits appear symmetric bilaterally. IMPRESSION: Age related volume loss with mild periventricular small vessel disease. No intracranial mass, hemorrhage, or evidence of acute infarct. Electronically Signed   By: Lowella Grip III M.D.   On: 07/01/2015 15:09     Scheduled Meds: . aspirin EC  81 mg Oral Daily  . cyanocobalamin  1,000 mcg Intramuscular Daily  . DULoxetine  40 mg Oral Daily  . enoxaparin (LOVENOX) injection  30 mg Subcutaneous Q24H  . insulin aspart  0-9 Units Subcutaneous TID WC  . Insulin Degludec  8 Units Subcutaneous QHS  . levothyroxine  100 mcg Oral QAC breakfast  . LORazepam  0.5 mg Oral BID  . raloxifene  60 mg Oral QHS   Continuous Infusions: . sodium chloride 75 mL/hr at 07/02/15 0729       Time Spent in minutes   45 minutes  Marylee Belzer D.O. on 07/02/2015 at 1:53 PM  Between 7am to 7pm - Pager - [phone]  After 7pm go to [email or web address] - password [email or web address]  And look for the night coverage person covering for me after hours  [facility] Office  [phone]

## 2015-07-03 ENCOUNTER — Inpatient Hospital Stay (HOSPITAL_COMMUNITY): Payer: Medicare Other

## 2015-07-03 DIAGNOSIS — Z79899 Other long term (current) drug therapy: Secondary | ICD-10-CM | POA: Diagnosis not present

## 2015-07-03 DIAGNOSIS — E538 Deficiency of other specified B group vitamins: Secondary | ICD-10-CM | POA: Diagnosis present

## 2015-07-03 DIAGNOSIS — E871 Hypo-osmolality and hyponatremia: Secondary | ICD-10-CM | POA: Diagnosis present

## 2015-07-03 DIAGNOSIS — J45909 Unspecified asthma, uncomplicated: Secondary | ICD-10-CM | POA: Diagnosis present

## 2015-07-03 DIAGNOSIS — K5900 Constipation, unspecified: Secondary | ICD-10-CM | POA: Diagnosis present

## 2015-07-03 DIAGNOSIS — R410 Disorientation, unspecified: Secondary | ICD-10-CM | POA: Diagnosis not present

## 2015-07-03 DIAGNOSIS — G459 Transient cerebral ischemic attack, unspecified: Secondary | ICD-10-CM | POA: Diagnosis present

## 2015-07-03 DIAGNOSIS — E039 Hypothyroidism, unspecified: Secondary | ICD-10-CM | POA: Diagnosis present

## 2015-07-03 DIAGNOSIS — D259 Leiomyoma of uterus, unspecified: Secondary | ICD-10-CM | POA: Diagnosis present

## 2015-07-03 DIAGNOSIS — R030 Elevated blood-pressure reading, without diagnosis of hypertension: Secondary | ICD-10-CM | POA: Diagnosis present

## 2015-07-03 DIAGNOSIS — Z87891 Personal history of nicotine dependence: Secondary | ICD-10-CM | POA: Diagnosis not present

## 2015-07-03 DIAGNOSIS — F419 Anxiety disorder, unspecified: Secondary | ICD-10-CM | POA: Diagnosis not present

## 2015-07-03 DIAGNOSIS — E10319 Type 1 diabetes mellitus with unspecified diabetic retinopathy without macular edema: Secondary | ICD-10-CM | POA: Diagnosis present

## 2015-07-03 DIAGNOSIS — F329 Major depressive disorder, single episode, unspecified: Secondary | ICD-10-CM | POA: Diagnosis present

## 2015-07-03 DIAGNOSIS — Z7982 Long term (current) use of aspirin: Secondary | ICD-10-CM | POA: Diagnosis not present

## 2015-07-03 DIAGNOSIS — E119 Type 2 diabetes mellitus without complications: Secondary | ICD-10-CM | POA: Diagnosis not present

## 2015-07-03 DIAGNOSIS — Z9981 Dependence on supplemental oxygen: Secondary | ICD-10-CM | POA: Diagnosis not present

## 2015-07-03 DIAGNOSIS — R634 Abnormal weight loss: Secondary | ICD-10-CM | POA: Diagnosis present

## 2015-07-03 DIAGNOSIS — Z794 Long term (current) use of insulin: Secondary | ICD-10-CM | POA: Diagnosis not present

## 2015-07-03 DIAGNOSIS — R0902 Hypoxemia: Secondary | ICD-10-CM | POA: Diagnosis present

## 2015-07-03 DIAGNOSIS — E1065 Type 1 diabetes mellitus with hyperglycemia: Secondary | ICD-10-CM | POA: Diagnosis present

## 2015-07-03 DIAGNOSIS — E1040 Type 1 diabetes mellitus with diabetic neuropathy, unspecified: Secondary | ICD-10-CM | POA: Diagnosis present

## 2015-07-03 LAB — BASIC METABOLIC PANEL
Anion gap: 9 (ref 5–15)
BUN: 6 mg/dL (ref 6–20)
CO2: 28 mmol/L (ref 22–32)
Calcium: 8.6 mg/dL — ABNORMAL LOW (ref 8.9–10.3)
Chloride: 99 mmol/L — ABNORMAL LOW (ref 101–111)
Creatinine, Ser: 0.7 mg/dL (ref 0.44–1.00)
GFR calc Af Amer: >60 mL/min (ref >60)
GLUCOSE: 155 mg/dL — AB (ref 65–99)
POTASSIUM: 3.6 mmol/L (ref 3.5–5.1)
Sodium: 136 mmol/L (ref 135–145)

## 2015-07-03 LAB — GLUCOSE, CAPILLARY
GLUCOSE-CAPILLARY: 189 mg/dL — AB (ref 65–99)
Glucose-Capillary: 143 mg/dL — ABNORMAL HIGH (ref 65–99)
Glucose-Capillary: 189 mg/dL — ABNORMAL HIGH (ref 65–99)
Glucose-Capillary: 261 mg/dL — ABNORMAL HIGH (ref 65–99)
Glucose-Capillary: 183 mg/dL — ABNORMAL HIGH (ref 65–99)

## 2015-07-03 LAB — CBC
HCT: 33.6 % — ABNORMAL LOW (ref 36.0–46.0)
Hemoglobin: 11.3 g/dL — ABNORMAL LOW (ref 12.0–15.0)
MCH: 32 pg (ref 26.0–34.0)
MCHC: 33.6 g/dL (ref 30.0–36.0)
MCV: 95.2 fL (ref 78.0–100.0)
PLATELETS: 221 10*3/uL (ref 150–400)
RBC: 3.53 MIL/uL — AB (ref 3.87–5.11)
RDW: 13.3 % (ref 11.5–15.5)
WBC: 5.5 10*3/uL (ref 4.0–10.5)

## 2015-07-03 MED ORDER — BISACODYL 10 MG RE SUPP
10.0000 mg | Freq: Once | RECTAL | Status: AC
  Administered 2015-07-03: 10 mg via RECTAL
  Filled 2015-07-03: qty 1

## 2015-07-03 MED ORDER — INSULIN ASPART 100 UNIT/ML ~~LOC~~ SOLN
0.0000 [IU] | Freq: Three times a day (TID) | SUBCUTANEOUS | Status: DC
  Administered 2015-07-03: 3 [IU] via SUBCUTANEOUS
  Administered 2015-07-04: 2 [IU] via SUBCUTANEOUS

## 2015-07-03 NOTE — Progress Notes (Signed)
PROGRESS NOTE    Leslie Mccann  [id number] DOB: Dec 27, 1945 DOA: 07/01/2015 PCP: Sheela Stack, MD   Brief Narrative:  On 07/01/2015 by Ms. Tye Savoy, NP Leslie Mccann is a 70 y.o. 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 Patient has been hospitalized 3 times ([facility] and [facility]) since March for weakness, confusion. Per husband she is on a 1.5 fluid restriction daily and salt pills as needed for low sodium / hypotension. Husband and daughter very concerned about intermittent hyponatremia and now weight loss and profound weakness.   Extensive record review in Epic shows that patient previously evaluated by Neurology for gait disorder and recurrent falls, Seizures, possible TIA, and sleeping problems. Sleep study normal except for nighttime hypoxemia. Patient evaluated by pulmonologist, Dr. Lamonte Sakai November 2015, started on 02 at night. There was concern that her psychiatric medications might be suppressing her respiratory drive. PFTs unremarkable. No intrinsic lung disease found.   Patient admitted to [facility] in March of this year for confusion. Brain MRI negative for acute findings. EEG was abnormal, she was started on antiepileptic medication which had to be stopped due to tremors and lethargy. Apparently patient had a repeat EEG which was normal, she was taken off seizure medications.  Patient was recently admitted to [facility] with, according to our last discharge summary 06/23/15 , a sodium in the 120s. TSH was apparently normal. Then readmitted to [facility] earlier this month for lightheadedness, poor appetite and syncope felt to be secondary to dehydration. There was question of Addison's as well.  Patient in ED today with dizziness, recurrent problems with speech, weakness which husband believes is secondary to low sodium. Took salt pill this morning for low BP. On 1.5  liter fluid restriction and salt pills as needed depending on BP. Family frustrated at lack of diagnosis  Assessment & Plan   Confusion and generalized weakness/ Hyponatremia -Confusion resolved -Head CT today negative for acute findings. -This is her fourth admission since March for similar symptoms. In March head MRI without any acute findings.  -TSH was normal, B12 low normal.  -Continue IVF -Sodium improving, 136 today -Cortrosyn stimulation- baseline Cortisol 31.4, 30 minutes 38.6, 60 minutes 42- will try to discuss results with Dr. Forde Dandy on 07/04/2015 -Continue to monitor BMP -Husband concerned for pheochromocytoma (after reading about symptoms online)- pending metanephrine 24 hour urine collection   Weight loss -Weight 15 pounds since March.  -CT scan chest / abd/pelvis: Calcified mass in the lower pelvis 12 x 9 cm, enlarged uterus with multiple calcified and noncalcified fibroids, noncalcified mass in presacral space of the upper pelvis 6.2 x 6.1 cm. Otherwise unremarkable chest and abdominal CT -Reviewed findings of CT scan with general surgery, Dr. Barry Dienes, recommended outpatient gynecology follow-up -Will obtain pelvic US   Constipation  -Continue miralax, dulcolax  Elevated blood pressure -No history of hypertension per husband -BP usually runs low, has been normotensive  -Monitor BP for now  Nocturnal hypoxemia  -Negative workup by Pulmonary.  -Concern for psych meds suppressing respiratory drive.  -Prescribed 02 at night, not diligent in using it -Continue supplemental O2 QHS  Anxiety / depression -Continue cymbalta and ativan  Hypothyroidism -Continue synthroid -TSH 1.591 (06/21/2015)  Type I diabetes -reduce QHS Tresiba by 1/2 dose, 8 units to 4 units - not eating much. -Continue ISS and CBG monitoring -Husband seems to think patient is in DKA- explained that patient is not- went through patient's  labs with the  husband  DVT Prophylaxis Lovenox  Code Status: Full  Family Communication: Sister at bedside  Disposition Plan: Admitted for observation.  Pending further labs.   Consultants Dr. Barry Dienes, gen surg, via phone  Procedures  None  Antibiotics   Anti-infectives    None      Subjective:   Leslie Mccann seen and examined today.  Patient states she is feeling better but tired this morning.  Denies chest pain, shortness of breath, abdominal pain.  Has a history of fibroids.   Objective:   Filed Vitals:   07/02/15 1425 07/02/15 1648 07/02/15 2124 07/03/15 0603  BP: 138/59 137/70 166/69 120/66  Pulse: 86 84 87 81  Temp: 98.5 F (36.9 C)  98.2 F (36.8 C) 97.9 F (36.6 C)  TempSrc: Oral     Resp: 19  18 18   SpO2: 100%  100% 95%    Intake/Output Summary (Last 24 hours) at 07/03/15 1246 Last data filed at 07/03/15 0901  Gross per 24 hour  Intake   2145 ml  Output   2550 ml  Net   -405 ml   There were no vitals filed for this visit.  Exam  General: Well developed, thin, No distress  HEENT: NCAT, mucous membranes moist.   Cardiovascular: S1 S2 auscultated, no murmurs, RRR  Respiratory: Clear to auscultation  Abdomen: Soft, nontender, nondistended, + bowel sounds  Extremities: warm dry without cyanosis clubbing or edema  Neuro: AAOx3, nonfocal  Psych: Appropriate mood and affect  Data Reviewed: I have personally reviewed following labs and imaging studies  CBC:  Recent Labs Lab 06/30/15 1050 07/01/15 1453 07/01/15 1512 07/02/15 0739 07/03/15 0559  WBC 3.2* 5.1  --  5.5 5.5  NEUTROABS 1.9 3.3  --   --   --   HGB 12.9 12.6 14.3 11.6* 11.3*  HCT 37.8 37.2 42.0 35.0* 33.6*  MCV 98.2 96.9  --  96.4 95.2  PLT 235 238  --  220 [id number]   Basic Metabolic Panel:  Recent Labs Lab 06/30/15 1050 07/01/15 1453 07/01/15 1512 07/02/15 0739 07/03/15 0559  NA 132* 133* 134* 137 136  K 4.4 3.7 3.7 4.0 3.6  CL 97* 99* 96* 104 99*  CO2 26 25  --  23 28  GLUCOSE  191* 198* 188* 220* 155*  BUN 10 6 7 6 6   CREATININE 0.69 0.72 0.60 0.57 0.70  CALCIUM 9.0 8.6*  --  8.3* 8.6*   GFR: Estimated Creatinine Clearance: 56 mL/min (by C-G formula based on Cr of 0.7). Liver Function Tests:  Recent Labs Lab 07/01/15 1453  AST 19  ALT 18  ALKPHOS 46  BILITOT 0.8  PROT 6.0*  ALBUMIN 3.4*   No results for input(s): LIPASE, AMYLASE in the last 168 hours. No results for input(s): AMMONIA in the last 168 hours. Coagulation Profile: No results for input(s): INR, PROTIME in the last 168 hours. Cardiac Enzymes: No results for input(s): CKTOTAL, CKMB, CKMBINDEX, TROPONINI in the last 168 hours. BNP (last 3 results) No results for input(s): PROBNP in the last 8760 hours. HbA1C: No results for input(s): HGBA1C in the last 72 hours. CBG:  Recent Labs Lab 07/02/15 1143 07/02/15 1651 07/02/15 2129 07/03/15 0733 07/03/15 1139  GLUCAP 209* 150* 151* 143* 189*   Lipid Profile: No results for input(s): CHOL, HDL, LDLCALC, TRIG, CHOLHDL, LDLDIRECT in the last 72 hours. Thyroid Function Tests: No results for input(s): TSH, T4TOTAL, FREET4, T3FREE, THYROIDAB in the last 72 hours. Anemia Panel:  No results for input(s): VITAMINB12, FOLATE, FERRITIN, TIBC, IRON, RETICCTPCT in the last 72 hours. Urine analysis:    Component Value Date/Time   COLORURINE YELLOW 07/01/2015 [place] 07/01/2015 1422   LABSPEC 1.010 07/01/2015 1422   PHURINE 7.0 07/01/2015 1422   GLUCOSEU NEGATIVE 07/01/2015 1422   HGBUR NEGATIVE 07/01/2015 1422   BILIRUBINUR NEGATIVE 07/01/2015 1422   KETONESUR 15* 07/01/2015 1422   PROTEINUR NEGATIVE 07/01/2015 1422   UROBILINOGEN 1.0 09/21/2012 0129   NITRITE NEGATIVE 07/01/2015 1422   LEUKOCYTESUR NEGATIVE 07/01/2015 1422   Sepsis Labs: @LABRCNTIP (procalcitonin:4,lacticidven:4)  ) Recent Results (from the past 240 hour(s))  Urine culture     Status: Abnormal   Collection Time: 06/30/15 12:54 PM  Result Value Ref  Range Status   Specimen Description URINE, RANDOM  Final   Special Requests NONE  Final   Culture MULTIPLE SPECIES PRESENT, SUGGEST RECOLLECTION (A)  Final   Report Status 07/01/2015 FINAL  Final      Radiology Studies: Dg Chest 2 View  07/01/2015  CLINICAL DATA:  Altered mental status. EXAM: CHEST  2 VIEW COMPARISON:  Jun 30, 2015. FINDINGS: The heart size and mediastinal contours are within normal limits. Both lungs are clear. No pneumothorax or pleural effusion is noted. The visualized skeletal structures are unremarkable. IMPRESSION: No active cardiopulmonary disease. Electronically Signed   By: Marijo Conception, M.D.   On: 07/01/2015 14:41   Ct Head Wo Contrast  07/01/2015  CLINICAL DATA:  Confusion/altered mental status EXAM: CT HEAD WITHOUT CONTRAST TECHNIQUE: Contiguous axial images were obtained from the base of the skull through the vertex without intravenous contrast. COMPARISON:  Head CT April 27, 2015; brain MRI April 28, 2015 FINDINGS: Age related volume loss is stable. There is no intracranial mass, hemorrhage, extra-axial fluid collection, or midline shift. Slight small vessel disease in the centra semiovale bilaterally is stable. There is no new gray-white compartment lesion. No acute infarct evident. Bony calvarium appears intact. The mastoid air cells are clear. Orbits appear symmetric bilaterally. IMPRESSION: Age related volume loss with mild periventricular small vessel disease. No intracranial mass, hemorrhage, or evidence of acute infarct. Electronically Signed   By: Lowella Grip III M.D.   On: 07/01/2015 15:09   Ct Chest W Contrast  07/02/2015  CLINICAL DATA:  Weight loss. EXAM: CT CHEST, ABDOMEN, AND PELVIS WITH CONTRAST TECHNIQUE: Multidetector CT imaging of the chest, abdomen and pelvis was performed following the standard protocol during bolus administration of intravenous contrast. CONTRAST:  1 ISOVUE-300 IOPAMIDOL (ISOVUE-300) INJECTION 61% COMPARISON:  None.  FINDINGS: CT CHEST FINDINGS Mediastinum/Lymph Nodes: Heart size is normal. No pericardial effusion. Coronary artery calcifications noted. Scattered atherosclerotic changes are seen along the walls of the normal-caliber thoracic aorta. No aortic aneurysm or dissection. No mass or enlarged lymph nodes seen within the mediastinum or perihilar regions. Trachea and central bronchi are unremarkable. At least mild bronchiectasis within the peripheral lungs bilaterally. Lungs/Pleura: Mild scarring/ atelectasis at the right lung base. Lungs are otherwise clear. No pulmonary nodule or mass. No pneumonia. No pleural effusion. Musculoskeletal: No chest wall mass or suspicious bone lesions identified. Mild degenerative change within the thoracic spine. CT ABDOMEN PELVIS FINDINGS Hepatobiliary: No masses or other significant abnormality. Pancreas: No mass, inflammatory changes, or other significant abnormality. Spleen: Within normal limits in size and appearance. Adrenals/Urinary Tract: No masses identified. No evidence of hydronephrosis. Stomach/Bowel: Bowel is normal in caliber. Fairly large amount of stool is seen throughout the colon. Stomach appears normal. Vascular/Lymphatic:  Abdominal aorta is normal in caliber. No enlarged lymph nodes appreciated. Reproductive: Lobular partially calcified mass is seen within the lower pelvis, measuring approximately 12 x 9 cm, presumably an enlarged uterus containing multiple calcified and noncalcified fibroids. Additional noncalcified mass within the presacral space of the upper pelvis measures approximately 6.2 x 6.1 cm (series 2, image 96). Other: No free fluid or abscess collection identified. No free intraperitoneal air. Musculoskeletal: Degenerative changes are seen throughout the slightly scoliotic lumbar spine, moderate in degree, with associated mild to moderate central canal stenoses at multiple levels. No acute or suspicious osseous lesion appreciated. IMPRESSION: 1. Lobular  partially calcified mass within the lower pelvis, measuring approximately 12 x 9 cm, presumably an enlarged uterus containing multiple calcified and noncalcified fibroids. There is, however, a noncalcified mass within the presacral space of the upper pelvis measuring approximately 6.2 x 6.1 cm. This presacral mass could be an additional noncalcified fibroid exophytic to the upper uterus, however, concomitant neoplastic mass cannot be excluded. Recommend surgical consultation for further workup considerations, possibly needing laparoscopy for definitive characterization. 2. Remainder of the abdomen and pelvis portion of the exam is unremarkable. Fairly large amount of stool within the colon (constipation?). 3. Unremarkable chest CT. No acute intrathoracic findings. Coronary artery calcifications. Bronchiectasis. These results will be called to the ordering clinician or representative by the Radiologist Assistant, and communication documented in the PACS or zVision Dashboard. Electronically Signed   By: Franki Cabot M.D.   On: 07/02/2015 21:53   Ct Abdomen Pelvis W Contrast  07/02/2015  CLINICAL DATA:  Weight loss. EXAM: CT CHEST, ABDOMEN, AND PELVIS WITH CONTRAST TECHNIQUE: Multidetector CT imaging of the chest, abdomen and pelvis was performed following the standard protocol during bolus administration of intravenous contrast. CONTRAST:  1 ISOVUE-300 IOPAMIDOL (ISOVUE-300) INJECTION 61% COMPARISON:  None. FINDINGS: CT CHEST FINDINGS Mediastinum/Lymph Nodes: Heart size is normal. No pericardial effusion. Coronary artery calcifications noted. Scattered atherosclerotic changes are seen along the walls of the normal-caliber thoracic aorta. No aortic aneurysm or dissection. No mass or enlarged lymph nodes seen within the mediastinum or perihilar regions. Trachea and central bronchi are unremarkable. At least mild bronchiectasis within the peripheral lungs bilaterally. Lungs/Pleura: Mild scarring/ atelectasis at the  right lung base. Lungs are otherwise clear. No pulmonary nodule or mass. No pneumonia. No pleural effusion. Musculoskeletal: No chest wall mass or suspicious bone lesions identified. Mild degenerative change within the thoracic spine. CT ABDOMEN PELVIS FINDINGS Hepatobiliary: No masses or other significant abnormality. Pancreas: No mass, inflammatory changes, or other significant abnormality. Spleen: Within normal limits in size and appearance. Adrenals/Urinary Tract: No masses identified. No evidence of hydronephrosis. Stomach/Bowel: Bowel is normal in caliber. Fairly large amount of stool is seen throughout the colon. Stomach appears normal. Vascular/Lymphatic: Abdominal aorta is normal in caliber. No enlarged lymph nodes appreciated. Reproductive: Lobular partially calcified mass is seen within the lower pelvis, measuring approximately 12 x 9 cm, presumably an enlarged uterus containing multiple calcified and noncalcified fibroids. Additional noncalcified mass within the presacral space of the upper pelvis measures approximately 6.2 x 6.1 cm (series 2, image 96). Other: No free fluid or abscess collection identified. No free intraperitoneal air. Musculoskeletal: Degenerative changes are seen throughout the slightly scoliotic lumbar spine, moderate in degree, with associated mild to moderate central canal stenoses at multiple levels. No acute or suspicious osseous lesion appreciated. IMPRESSION: 1. Lobular partially calcified mass within the lower pelvis, measuring approximately 12 x 9 cm, presumably an enlarged uterus containing multiple calcified and  noncalcified fibroids. There is, however, a noncalcified mass within the presacral space of the upper pelvis measuring approximately 6.2 x 6.1 cm. This presacral mass could be an additional noncalcified fibroid exophytic to the upper uterus, however, concomitant neoplastic mass cannot be excluded. Recommend surgical consultation for further workup considerations,  possibly needing laparoscopy for definitive characterization. 2. Remainder of the abdomen and pelvis portion of the exam is unremarkable. Fairly large amount of stool within the colon (constipation?). 3. Unremarkable chest CT. No acute intrathoracic findings. Coronary artery calcifications. Bronchiectasis. These results will be called to the ordering clinician or representative by the Radiologist Assistant, and communication documented in the PACS or zVision Dashboard. Electronically Signed   By: Franki Cabot M.D.   On: 07/02/2015 21:53     Scheduled Meds: . aspirin EC  81 mg Oral Daily  . DULoxetine  40 mg Oral Daily  . enoxaparin (LOVENOX) injection  30 mg Subcutaneous Q24H  . insulin aspart  0-9 Units Subcutaneous TID WC  . Insulin Degludec  8 Units Subcutaneous QHS  . levothyroxine  100 mcg Oral QAC breakfast  . LORazepam  0.5 mg Oral BID  . raloxifene  60 mg Oral QHS   Continuous Infusions: . sodium chloride 75 mL/hr at 07/02/15 1816       Time Spent in minutes   45 minutes  Malana Eberwein D.O. on 07/03/2015 at 12:46 PM  Between 7am to 7pm - Pager - [phone]  After 7pm go to [email or web address] - password [email or web address]  And look for the night coverage person covering for me after hours  [facility] Office  [phone]

## 2015-07-03 NOTE — Evaluation (Signed)
Physical Therapy Evaluation Patient Details Name: Leslie Mccann MRN: [id number] DOB: [birth year] Today's Date: 07/03/2015   History of Present Illness  Pt is a 70 y.o. 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 Patient has been hospitalized 3 times ([facility] and [facility]) since March for weakness, confusion. Per husband she is on a 1.5 fluid restriction daily and salt pills as needed for low sodium / hypotension. Husband and daughter very concerned about intermittent hyponatremia and now weight loss and profound weakness. She was admitted 07/01/15 with dizziness, recurrent problems with speech, weakness which husband believes is secondary to low sodium.  Clinical Impression  Pt's mobility limited today by urge to use bathroom and suppository.  Pt has highly supportive family.  Her sister (who lives out of town) requests that pt receive Meals On Wheels to support nutrition and manage family's resources for instrumental ADLs (husband also has some health issues).  Pt is unsure whether she would like a bathbench for home and she is worried about water containment while showering.  Patient may benefit from further skilled PT to improve mobility, reduce fall risk and incr functional independence.     Follow Up Recommendations Home health PT;Supervision for mobility/OOB    Equipment Recommendations  Other (comment) (shower bench)    Recommendations for Other Services       Precautions / Restrictions Precautions Precautions: Fall Precaution Comments: has syncopal episodes Restrictions Weight Bearing Restrictions: No      Mobility  Bed Mobility Overal bed mobility: Independent             General bed mobility comments: supine to sit to Rt side of bed  Transfers Overall transfer level: Needs assistance Equipment used: Rolling walker (2 wheeled) Transfers: Sit to/from  [facility] Sit to Stand: Supervision Stand pivot transfers: Supervision       General transfer comment: toilet transfer using BSC over toilet supervision  Ambulation/Gait Ambulation/Gait assistance: Min guard Ambulation Distance (Feet): 20 Feet Assistive device: Rolling walker (2 wheeled) Gait Pattern/deviations: Decreased step length - right;Decreased step length - left;Trunk flexed Gait velocity: decreased   General Gait Details: limited to in room mobility secondary to bowel issues/suppository  Stairs            Wheelchair Mobility    Modified Rankin (Stroke Patients Only)       Balance Overall balance assessment: Needs assistance Sitting-balance support: No upper extremity supported;Feet supported Sitting balance-Leahy Scale: Good     Standing balance support: No upper extremity supported Standing balance-Leahy Scale: Fair Standing balance comment: performed hand washing task at sink without LOB                             Pertinent Vitals/Pain Pain Assessment: No/denies pain    Home Living Family/patient expects to be discharged to:: Private residence Living Arrangements: Spouse/significant other Available Help at Discharge: Family;Available 24 hours/day Type of Home: House Home Access: Stairs to enter Entrance Stairs-Rails: Right;Left (wide rails) Entrance Stairs-Number of Steps: 4 Home Layout: One level Home Equipment: Walker - 2 wheels;Shower seat Additional Comments: husband with health issues and may not be able to provide heavy physical assist    Prior Function Level of Independence: Independent with assistive device(s)         Comments: began using RW within past few months     Hand Dominance   Dominant Hand: Right  Extremity/Trunk Assessment   Upper Extremity Assessment: Overall WFL for tasks assessed           Lower Extremity Assessment: Overall WFL for tasks assessed      Cervical / Trunk  Assessment: Normal  Communication   Communication: No difficulties  Cognition Arousal/Alertness: Awake/alert Behavior During Therapy: WFL for tasks assessed/performed Overall Cognitive Status: Within Functional Limits for tasks assessed                      General Comments      Exercises        Assessment/Plan    PT Assessment Patient needs continued PT services  PT Diagnosis Difficulty walking;Generalized weakness   PT Problem List Decreased strength;Decreased activity tolerance;Decreased balance;Decreased mobility;Decreased knowledge of use of DME  PT Treatment Interventions DME instruction;Gait training;Stair training;Functional mobility training;Therapeutic activities;Therapeutic exercise;Patient/family education;Balance training;Neuromuscular re-education   PT Goals (Current goals can be found in the Care Plan section) Acute Rehab PT Goals Patient Stated Goal: regain strength PT Goal Formulation: With patient/family Time For Goal Achievement: 07/16/15 Potential to Achieve Goals: Good    Frequency Min 3X/week   Barriers to discharge Decreased caregiver support      Co-evaluation               End of Session Equipment Utilized During Treatment: Gait belt Activity Tolerance: Patient tolerated treatment well Patient left: in chair;with call bell/phone within reach;with chair alarm set;with family/visitor present Nurse Communication: Mobility status    Functional Assessment Tool Used: mobility Functional Limitation: Mobility: Walking and moving around Mobility: Walking and Moving Around Current Status [phone]): At least 20 percent but less than 40 percent impaired, limited or restricted Mobility: Walking and Moving Around Goal Status [phone]): At least 1 percent but less than 20 percent impaired, limited or restricted    Time: 1142-1232 PT Time Calculation (min) (ACUTE ONLY): 50 min   Charges:   PT Evaluation $PT Eval Low Complexity: 1 Procedure PT  Treatments $Therapeutic Activity: 8-22 mins $Self Care/Home Management: 8-22   PT G Codes:   PT G-Codes **NOT FOR INPATIENT CLASS** Functional Assessment Tool Used: mobility Functional Limitation: Mobility: Walking and moving around Mobility: Walking and Moving Around Current Status [phone]): At least 20 percent but less than 40 percent impaired, limited or restricted Mobility: Walking and Moving Around Goal Status [phone]): At least 1 percent but less than 20 percent impaired, limited or restricted   Malka So, [place]  [place] 07/03/2015, 1:10 PM

## 2015-07-03 NOTE — Care Management Note (Signed)
Case Management Note  Patient Details  Name: Leslie Mccann MRN: [id number] Date of Birth: Nov 11, 1945  Subjective/Objective:    Presents with weakness, confusion recently d/c to home with husband with home health services in place. 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 Patient has been hospitalized 3 times ([facility] and [facility]) since March for weakness, confusion. Requires assistance with ADL's and owns rolling walker. PCP: Roque Cash.   Action/Plan:  Return to home when medically stable. CM to f/u with disposition needs. [facility] referral in place for community support.  Expected Discharge Date:                  Expected Discharge Plan:  Home/Self Care [name], [phone])  In-House Referral:     Discharge planning Services  CM Consult  Post Acute Care Choice:  Resumption of Svcs/PTA Provider Choice offered to:  Patient, Spouse  DME Arranged:    DME Agency:     HH Arranged:  PT, OT, RN, Nurse's Aide (active) [place] Agency:  [facility]  Status of Service:  In process, will continue to follow  Medicare Important Message Given:    Date Medicare IM Given:    Medicare IM give by:    Date Additional Medicare IM Given:    Additional Medicare Important Message give by:     If discussed at [place] of Stay Meetings, dates discussed:    Additional Comments:  Sharin Mons, [place] [phone] 07/03/2015, 12:42 PM

## 2015-07-04 ENCOUNTER — Ambulatory Visit: Payer: Managed Care, Other (non HMO) | Admitting: Physical Therapy

## 2015-07-04 ENCOUNTER — Inpatient Hospital Stay (HOSPITAL_COMMUNITY): Admission: RE | Admit: 2015-07-04 | Payer: Managed Care, Other (non HMO) | Source: Ambulatory Visit

## 2015-07-04 LAB — BASIC METABOLIC PANEL
ANION GAP: 4 — AB (ref 5–15)
BUN: 7 mg/dL (ref 6–20)
CALCIUM: 8.5 mg/dL — AB (ref 8.9–10.3)
CO2: 28 mmol/L (ref 22–32)
Chloride: 106 mmol/L (ref 101–111)
Creatinine, Ser: 0.56 mg/dL (ref 0.44–1.00)
GFR calc non Af Amer: >60 mL/min (ref >60)
Glucose, Bld: 135 mg/dL — ABNORMAL HIGH (ref 65–99)
Potassium: 3.6 mmol/L (ref 3.5–5.1)
Sodium: 138 mmol/L (ref 135–145)

## 2015-07-04 LAB — CBC
HEMATOCRIT: 32.6 % — AB (ref 36.0–46.0)
HEMOGLOBIN: 11 g/dL — AB (ref 12.0–15.0)
MCH: 33.1 pg (ref 26.0–34.0)
MCHC: 33.7 g/dL (ref 30.0–36.0)
MCV: 98.2 fL (ref 78.0–100.0)
Platelets: 203 10*3/uL (ref 150–400)
RBC: 3.32 MIL/uL — ABNORMAL LOW (ref 3.87–5.11)
RDW: 13.4 % (ref 11.5–15.5)
WBC: 4.8 10*3/uL (ref 4.0–10.5)

## 2015-07-04 LAB — GLUCOSE, CAPILLARY
GLUCOSE-CAPILLARY: 127 mg/dL — AB (ref 65–99)
GLUCOSE-CAPILLARY: 233 mg/dL — AB (ref 65–99)

## 2015-07-04 NOTE — Progress Notes (Addendum)
Pt's husband at bedside. He expressed being upset about some of the care patient has received during this admission. Pt's husband reported another hospitalist, that was not Ree Kida,  stated pt should be on B12 injections daily. Per MAR, B12 order was administered and completed. MD contacted about medication and reported that she should speak with PCP about that specific medication.Pt's husband was not happy about matter. Pt's daughter also called during the morning and asked for the status update on pt and to speak with MD. Update was given by this nurse and MD was notified that daughter wanted to speak with her. Unsure if MD spoke with daughter prior to discharge. Discharge instruction was done with pt and pt's husband. All questions were answered.

## 2015-07-04 NOTE — Plan of Care (Signed)
Received a message to call patient's daughter, Ardell Isaacs, at [phone]. Attempted to call back this number, continuous ring.  No option to leave a message.

## 2015-07-04 NOTE — Discharge Instructions (Signed)
Confusion Confusion is the inability to think with your usual speed or clarity. Confusion may come on quickly or slowly over time. How quickly the confusion comes on depends on the cause. Confusion can be due to any number of causes. CAUSES   Concussion, head injury, or head trauma.  Seizures.  Stroke.  Fever.  Brain tumor.  Age related decreased brain function (dementia).  Heightened emotional states like rage or terror.  Mental illness in which the person loses the ability to determine what is real and what is not (hallucinations).  Infections such as a urinary tract infection (UTI).  Toxic effects from alcohol, drugs, or prescription medicines.  Dehydration and an imbalance of salts in the body (electrolytes).  Lack of sleep.  Low blood sugar (diabetes).  Low levels of oxygen from conditions such as chronic lung disorders.  Drug interactions or other medicine side effects.  Nutritional deficiencies, especially niacin, thiamine, vitamin C, or vitamin B.  Sudden drop in body temperature (hypothermia).  Change in routine, such as when traveling or hospitalized. SIGNS AND SYMPTOMS  People often describe their thinking as cloudy or unclear when they are confused. Confusion can also include feeling disoriented. That means you are unaware of where or who you are. You may also not know what the date or time is. If confused, you may also have difficulty paying attention, remembering, and making decisions. Some people also act aggressively when they are confused.  DIAGNOSIS  The medical evaluation of confusion may include:  Blood and urine tests.  X-rays.  Brain and nervous system tests.  Analyzing your brain waves (electroencephalogram or EEG).  Magnetic resonance imaging (MRI) of your head.  Computed tomography (CT) scan of your head.  Mental status tests in which your health care provider may ask many questions. Some of these questions may seem silly or strange,  but they are a very important test to help diagnose and treat confusion. TREATMENT  An admission to the hospital may not be needed, but a person with confusion should not be left alone. Stay with a family member or friend until the confusion clears. Avoid alcohol, pain relievers, or sedative drugs until you have fully recovered. Do not drive until directed by your health care provider. HOME CARE INSTRUCTIONS  What family and friends can do:  To find out if someone is confused, ask the person to state his or her name, age, and the date. If the person is unsure or answers incorrectly, he or she is confused.  Always introduce yourself, no matter how well the person knows you.  Often remind the person of his or her location.  Place a calendar and clock near the confused person.  Help the person with his or her medicines. You may want to use a pill box, an alarm as a reminder, or give the person each dose as prescribed.  Talk about current events and plans for the day.  Try to keep the environment calm, quiet, and peaceful.  Make sure the person keeps follow-up visits with his or her health care provider. PREVENTION  Ways to prevent confusion:  Avoid alcohol.  Eat a balanced diet.  Get enough sleep.  Take medicine only as directed by your health care provider.  Do not become isolated. Spend time with other people and make plans for your days.  Keep careful watch on your blood sugar levels if you are diabetic. SEEK IMMEDIATE MEDICAL CARE IF:   You develop severe headaches, repeated vomiting, seizures, blackouts, or   slurred speech.  There is increasing confusion, weakness, numbness, restlessness, or personality changes.  You develop a loss of balance, have marked dizziness, feel uncoordinated, or fall.  You have delusions, hallucinations, or develop severe anxiety.  Your family members think you need to be rechecked.   This information is not intended to replace advice given  to you by your health care provider. Make sure you discuss any questions you have with your health care provider.   Document Released: 03/08/2004 Document Revised: 02/19/2014 Document Reviewed: 03/06/2013 [employer] Interactive Patient Education 2016 [employer]  

## 2015-07-04 NOTE — Discharge Summary (Addendum)
Physician Discharge Summary  Leslie Mccann [id number] DOB: Jun 26, 1945 DOA: 07/01/2015  PCP: Sheela Stack, MD  Admit date: 07/01/2015 Discharge date: 07/04/2015  Time spent: 45 minutes  Recommendations for Outpatient Follow-up:  Patient will be discharged to home with home health sevices, PT, RN, Aide, OT.  Patient will need to follow up with primary care provider within one week of discharge, repeat BMP.  Patient should also follow-up with gynecology. Patient should continue medications as prescribed.  Patient should follow a carb modified diet.   Discharge Diagnoses:  Confusion and generalized weakness/hyponatremia Weight loss Calcified fibroids Constipation Elevated blood pressure Nocturnal hypoxemia Anxiety/depression Hypothyroidism Type 1 diabetes  Discharge Condition: stable  Diet recommendation: carb modified   There were no vitals filed for this visit.  History of present illness:  On 07/01/2015 by Ms. Tye Savoy, NP Leslie Mccann is a 70 y.o. 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 Patient has been hospitalized 3 times ([facility] and [facility]) since March for weakness, confusion. Per husband she is on a 1.5 fluid restriction daily and salt pills as needed for low sodium / hypotension. Husband and daughter very concerned about intermittent hyponatremia and now weight loss and profound weakness.   Extensive record review in Epic shows that patient previously evaluated by Neurology for gait disorder and recurrent falls, Seizures, possible TIA, and sleeping problems. Sleep study normal except for nighttime hypoxemia. Patient evaluated by pulmonologist, Dr. Lamonte Sakai November 2015, started on 02 at night. There was concern that her psychiatric medications might be suppressing her respiratory drive. PFTs unremarkable. No intrinsic lung disease found.    Patient admitted to [facility] in March of this year for confusion. Brain MRI negative for acute findings. EEG was abnormal, she was started on antiepileptic medication which had to be stopped due to tremors and lethargy. Apparently patient had a repeat EEG which was normal, she was taken off seizure medications.  Patient was recently admitted to [facility] with, according to our last discharge summary 06/23/15 , a sodium in the 120s. TSH was apparently normal. Then readmitted to [facility] earlier this month for lightheadedness, poor appetite and syncope felt to be secondary to dehydration. There was question of Addison's as well.  Patient in ED today with dizziness, recurrent problems with speech, weakness which husband believes is secondary to low sodium. Took salt pill this morning for low BP. On 1.5 liter fluid restriction and salt pills as needed depending on BP. Family frustrated at lack of diagnosis  Hospital Course:  Confusion and generalized weakness/ Hyponatremia -Confusion resolved -Head CT today negative for acute findings. -This is her fourth admission since March for similar symptoms. In March head MRI without any acute findings.  -TSH was normal, B12 low normal. Patient did receive B12 injections during hospitalization. -Continue IVF -Sodium improving, 138 today -Cortrosyn stimulation- baseline Cortisol 31.4, 30 minutes 38.6, 60 minutes 42- will try to discuss results with Dr. Forde Dandy on 07/04/2015 -Continue to monitor BMP -Husband concerned for pheochromocytoma (after reading about symptoms online)- pending metanephrine 24 hour urine collection  -24 hour urine metanephrine pending however can be followed up on as an outpatient -Spoke with Dr. Forde Dandy, endocrinologist, today 07/04/2015. Stated that Clayton stimulation test was actually normal, was happy that cortisol level was over 20. I also reviewed findings of the CT scan, patient's adrenal are normal. No need for further testing including MRI,  at this time. -Spoke with the patient's husband  today, both on the phone and in person. Patient's husband is frustrated that no specific answers have been given and no cause for patient's hyponatremia and weakness and confusion. Explained to patient and husband results of all the tests that have been done have been inconclusive at this time. Patient may decide to follow-up with another physician for another opinion. Referred patient to [facility], [facility] etc. -This discussion was also witnessed by case management -Counseled patient as well as husband regarding liquid intake, went to restrict and went to drink extra fluids. Discussed drinking G2 Gatorade, taking sodium pills.  Weight loss -Weight 15 pounds since March.  -CT scan chest / abd/pelvis: Calcified mass in the lower pelvis 12 x 9 cm, enlarged uterus with multiple calcified and noncalcified fibroids, noncalcified mass in presacral space of the upper pelvis 6.2 x 6.1 cm. Otherwise unremarkable chest and abdominal CT -Reviewed findings of CT scan with general surgery, Dr. Barry Dienes, recommended outpatient gynecology follow-up -Pelvic ultrasound: Uterus not well assessed due to numerous calcified fibroids  Calcified fibroids -As noted on CT scan as well as ultrasound -Patient is aware of her fibroids, states it runs in the family. Advised patient to follow-up with her gynecologist. At that time if further testing needs to be done it can be ordered.   Constipation  -Continue miralax, dulcolax  Elevated blood pressure -No history of hypertension per husband -BP usually runs low, has been normotensive  -Monitor BP for now  Nocturnal hypoxemia  -Negative workup by Pulmonary.  -Concern for psych meds suppressing respiratory drive.  -Prescribed 02 at night, not diligent in using it -Continue supplemental O2 QHS  Anxiety / depression -Continue cymbalta and ativan  Hypothyroidism -Continue  synthroid -TSH 1.591 (06/21/2015)  Type I diabetes -Continue home regimen at discharge -During hospitalization, Tyler Aas was decreased, patient placed on insulin sliding scale CBG monitoring -Husband was concerned the patient was in DKA, reviewed labs with husband and a certain patient was not in diabetic ketoacidosis.  Consultants Dr. Barry Dienes, gen surg, via phone Dr. Reynold Bowen, Endocrinology, via phone  Procedures  None  Discharge Exam: Filed Vitals:   07/03/15 2314 07/04/15 0533  BP: 130/77 115/62  Pulse: 75 70  Temp: 97.9 F (36.6 C) 97.6 F (36.4 C)  Resp: 18 18   Exam  General: Well developed, thin, No distress  HEENT: NCAT, mucous membranes moist.   Cardiovascular: S1 S2 auscultated, no murmurs, RRR  Respiratory: Clear to auscultation  Abdomen: Soft, nontender, nondistended, + bowel sounds  Extremities: warm dry without cyanosis clubbing or edema  Neuro: AAOx3, nonfocal  Psych: Appropriate mood and affect, pleaseant  Discharge Instructions      Discharge Instructions    Discharge instructions    Complete by:  As directed   Patient will be discharged to home with home health sevices, PT, RN, Aide, OT.  Patient will need to follow up with primary care provider within one week of discharge, repeat BMP.  Patient should also follow-up with gynecology. Patient should continue medications as prescribed.  Patient should follow a carb modified diet.            Medication List    TAKE these medications        aspirin 81 MG tablet  Take 81 mg by mouth daily.     DULoxetine 20 MG capsule  Commonly known as:  CYMBALTA  Take 40 mg by mouth daily.     Fingertip Pulse Oximeter Misc  Use as directed  Pulse Oximeter Misc  1 Device by Does not apply route as directed.     GLUCAGON EMERGENCY 1 MG injection  Generic drug:  glucagon  Inject 1 mg into the muscle once as needed.     insulin lispro 100 UNIT/ML injection  Commonly known as:  HUMALOG    Inject 2-7 Units into the skin 3 (three) times daily as needed for high blood sugar (CBG >180). Per sliding scale     levothyroxine 100 MCG tablet  Commonly known as:  SYNTHROID, LEVOTHROID  Take 100 mcg by mouth daily before breakfast.     LORazepam 0.5 MG tablet  Commonly known as:  ATIVAN  Take 0.5 mg by mouth 2 (two) times daily.     ONE TOUCH ULTRA TEST test strip  Generic drug:  glucose blood     OXYGEN  Inhale 2 L into the lungs at bedtime.     raloxifene 60 MG tablet  Commonly known as:  EVISTA  Take 60 mg by mouth at bedtime.     sodium chloride 1 g tablet  Take 1 tablet (1 g total) by mouth daily as needed.     TRESIBA FLEXTOUCH 100 UNIT/ML Sopn  Generic drug:  Insulin Degludec  Inject 8 Units into the skin at bedtime.     Vitamin D (Ergocalciferol) 50000 units Caps capsule  Commonly known as:  DRISDOL  Take 50,000 Units by mouth every Wednesday.       Allergies  Allergen Reactions  . Ciprofloxacin Hcl Other (See Comments)    intolerance  . Codeine Nausea And Vomiting  . Sulfa Antibiotics Nausea And Vomiting  . Adhesive [Tape] Rash    pls use paper tape  . Latex Rash   Follow-up Information    Follow up with Sheela Stack, MD. Schedule an appointment as soon as possible for a visit in 1 week.   Specialty:  Endocrinology   Why:  Hospital follow-up   Contact information:   [address] [place] [address] [phone]        The results of significant diagnostics from this hospitalization (including imaging, microbiology, ancillary and laboratory) are listed below for reference.    Significant Diagnostic Studies: Dg Chest 2 View  07/01/2015  CLINICAL DATA:  Altered mental status. EXAM: CHEST  2 VIEW COMPARISON:  Jun 30, 2015. FINDINGS: The heart size and mediastinal contours are within normal limits. Both lungs are clear. No pneumothorax or pleural effusion is noted. The visualized skeletal structures are unremarkable. IMPRESSION: No  active cardiopulmonary disease. Electronically Signed   By: Marijo Conception, M.D.   On: 07/01/2015 14:41   X-ray Chest Pa And Lateral  06/21/2015  CLINICAL DATA:  70 year old female with hyponatremia and syncope. EXAM: CHEST  2 VIEW COMPARISON:  Chest radiograph dated 09/21/2012 FINDINGS: Two views of the chest demonstrate emphysematous changes of the lungs with increased AP diameter of the thoracic cage. There is no focal consolidation, pleural effusion, or pneumothorax. The cardiac silhouette is within normal limits. No acute osseous pathology. IMPRESSION: No active cardiopulmonary disease. Electronically Signed   By: Anner Crete M.D.   On: 06/21/2015 02:47   Ct Head Wo Contrast  07/01/2015  CLINICAL DATA:  Confusion/altered mental status EXAM: CT HEAD WITHOUT CONTRAST TECHNIQUE: Contiguous axial images were obtained from the base of the skull through the vertex without intravenous contrast. COMPARISON:  Head CT April 27, 2015; brain MRI April 28, 2015 FINDINGS: Age related volume loss is stable. There is no intracranial  mass, hemorrhage, extra-axial fluid collection, or midline shift. Slight small vessel disease in the centra semiovale bilaterally is stable. There is no new gray-white compartment lesion. No acute infarct evident. Bony calvarium appears intact. The mastoid air cells are clear. Orbits appear symmetric bilaterally. IMPRESSION: Age related volume loss with mild periventricular small vessel disease. No intracranial mass, hemorrhage, or evidence of acute infarct. Electronically Signed   By: Lowella Grip III M.D.   On: 07/01/2015 15:09   Ct Chest W Contrast  07/02/2015  CLINICAL DATA:  Weight loss. EXAM: CT CHEST, ABDOMEN, AND PELVIS WITH CONTRAST TECHNIQUE: Multidetector CT imaging of the chest, abdomen and pelvis was performed following the standard protocol during bolus administration of intravenous contrast. CONTRAST:  1 ISOVUE-300 IOPAMIDOL (ISOVUE-300) INJECTION 61% COMPARISON:   None. FINDINGS: CT CHEST FINDINGS Mediastinum/Lymph Nodes: Heart size is normal. No pericardial effusion. Coronary artery calcifications noted. Scattered atherosclerotic changes are seen along the walls of the normal-caliber thoracic aorta. No aortic aneurysm or dissection. No mass or enlarged lymph nodes seen within the mediastinum or perihilar regions. Trachea and central bronchi are unremarkable. At least mild bronchiectasis within the peripheral lungs bilaterally. Lungs/Pleura: Mild scarring/ atelectasis at the right lung base. Lungs are otherwise clear. No pulmonary nodule or mass. No pneumonia. No pleural effusion. Musculoskeletal: No chest wall mass or suspicious bone lesions identified. Mild degenerative change within the thoracic spine. CT ABDOMEN PELVIS FINDINGS Hepatobiliary: No masses or other significant abnormality. Pancreas: No mass, inflammatory changes, or other significant abnormality. Spleen: Within normal limits in size and appearance. Adrenals/Urinary Tract: No masses identified. No evidence of hydronephrosis. Stomach/Bowel: Bowel is normal in caliber. Fairly large amount of stool is seen throughout the colon. Stomach appears normal. Vascular/Lymphatic: Abdominal aorta is normal in caliber. No enlarged lymph nodes appreciated. Reproductive: Lobular partially calcified mass is seen within the lower pelvis, measuring approximately 12 x 9 cm, presumably an enlarged uterus containing multiple calcified and noncalcified fibroids. Additional noncalcified mass within the presacral space of the upper pelvis measures approximately 6.2 x 6.1 cm (series 2, image 96). Other: No free fluid or abscess collection identified. No free intraperitoneal air. Musculoskeletal: Degenerative changes are seen throughout the slightly scoliotic lumbar spine, moderate in degree, with associated mild to moderate central canal stenoses at multiple levels. No acute or suspicious osseous lesion appreciated. IMPRESSION: 1.  Lobular partially calcified mass within the lower pelvis, measuring approximately 12 x 9 cm, presumably an enlarged uterus containing multiple calcified and noncalcified fibroids. There is, however, a noncalcified mass within the presacral space of the upper pelvis measuring approximately 6.2 x 6.1 cm. This presacral mass could be an additional noncalcified fibroid exophytic to the upper uterus, however, concomitant neoplastic mass cannot be excluded. Recommend surgical consultation for further workup considerations, possibly needing laparoscopy for definitive characterization. 2. Remainder of the abdomen and pelvis portion of the exam is unremarkable. Fairly large amount of stool within the colon (constipation?). 3. Unremarkable chest CT. No acute intrathoracic findings. Coronary artery calcifications. Bronchiectasis. These results will be called to the ordering clinician or representative by the Radiologist Assistant, and communication documented in the PACS or zVision Dashboard. Electronically Signed   By: Franki Cabot M.D.   On: 07/02/2015 21:53   US Pelvis Complete  07/04/2015  CLINICAL DATA:  Assess calcified fibroids.  Initial encounter. EXAM: TRANSABDOMINAL ULTRASOUND OF PELVIS TECHNIQUE: Transabdominal ultrasound examination of the pelvis was performed including evaluation of the uterus, ovaries, adnexal regions, and pelvic cul-de-sac. COMPARISON:  CT of the abdomen and  pelvis from 07/02/2015 FINDINGS: Uterus Measurements: 9.7 x 6.4 x 7.8 cm. Difficult to assess due to numerous calcified fibroids, better seen on recent CT. The concerning mass noted on CT would be better characterized on laparoscopic evaluation or on contrast-enhanced MRI. Endometrium Not seen. Right ovary Measurements: 3.4 x 2.3 x 2.9 cm. Normal appearance/no adnexal mass. Left ovary Not visualized. Other findings:  No abnormal free fluid. IMPRESSION: Uterus not well assessed due to numerous calcified fibroids. The concerning mass noted  on CT, at the posterior aspect of the uterus, would be better characterized on laparoscopic evaluation, or on contrast-enhanced MRI of the pelvis. Electronically Signed   By: Garald Balding M.D.   On: 07/04/2015 03:58   Ct Abdomen Pelvis W Contrast  07/02/2015  CLINICAL DATA:  Weight loss. EXAM: CT CHEST, ABDOMEN, AND PELVIS WITH CONTRAST TECHNIQUE: Multidetector CT imaging of the chest, abdomen and pelvis was performed following the standard protocol during bolus administration of intravenous contrast. CONTRAST:  1 ISOVUE-300 IOPAMIDOL (ISOVUE-300) INJECTION 61% COMPARISON:  None. FINDINGS: CT CHEST FINDINGS Mediastinum/Lymph Nodes: Heart size is normal. No pericardial effusion. Coronary artery calcifications noted. Scattered atherosclerotic changes are seen along the walls of the normal-caliber thoracic aorta. No aortic aneurysm or dissection. No mass or enlarged lymph nodes seen within the mediastinum or perihilar regions. Trachea and central bronchi are unremarkable. At least mild bronchiectasis within the peripheral lungs bilaterally. Lungs/Pleura: Mild scarring/ atelectasis at the right lung base. Lungs are otherwise clear. No pulmonary nodule or mass. No pneumonia. No pleural effusion. Musculoskeletal: No chest wall mass or suspicious bone lesions identified. Mild degenerative change within the thoracic spine. CT ABDOMEN PELVIS FINDINGS Hepatobiliary: No masses or other significant abnormality. Pancreas: No mass, inflammatory changes, or other significant abnormality. Spleen: Within normal limits in size and appearance. Adrenals/Urinary Tract: No masses identified. No evidence of hydronephrosis. Stomach/Bowel: Bowel is normal in caliber. Fairly large amount of stool is seen throughout the colon. Stomach appears normal. Vascular/Lymphatic: Abdominal aorta is normal in caliber. No enlarged lymph nodes appreciated. Reproductive: Lobular partially calcified mass is seen within the lower pelvis, measuring  approximately 12 x 9 cm, presumably an enlarged uterus containing multiple calcified and noncalcified fibroids. Additional noncalcified mass within the presacral space of the upper pelvis measures approximately 6.2 x 6.1 cm (series 2, image 96). Other: No free fluid or abscess collection identified. No free intraperitoneal air. Musculoskeletal: Degenerative changes are seen throughout the slightly scoliotic lumbar spine, moderate in degree, with associated mild to moderate central canal stenoses at multiple levels. No acute or suspicious osseous lesion appreciated. IMPRESSION: 1. Lobular partially calcified mass within the lower pelvis, measuring approximately 12 x 9 cm, presumably an enlarged uterus containing multiple calcified and noncalcified fibroids. There is, however, a noncalcified mass within the presacral space of the upper pelvis measuring approximately 6.2 x 6.1 cm. This presacral mass could be an additional noncalcified fibroid exophytic to the upper uterus, however, concomitant neoplastic mass cannot be excluded. Recommend surgical consultation for further workup considerations, possibly needing laparoscopy for definitive characterization. 2. Remainder of the abdomen and pelvis portion of the exam is unremarkable. Fairly large amount of stool within the colon (constipation?). 3. Unremarkable chest CT. No acute intrathoracic findings. Coronary artery calcifications. Bronchiectasis. These results will be called to the ordering clinician or representative by the Radiologist Assistant, and communication documented in the PACS or zVision Dashboard. Electronically Signed   By: Franki Cabot M.D.   On: 07/02/2015 21:53   Dg Abd Acute W/chest  06/30/2015  CLINICAL DATA:  Constipation and increased urinary frequency EXAM: DG ABDOMEN ACUTE W/ 1V CHEST COMPARISON:  Chest radiograph Jun 21, 2015 FINDINGS: PA chest: No edema or consolidation. Heart size and pulmonary vascularity are normal. No adenopathy. Supine  and upright abdomen: There is fairly diffuse stool throughout the colon. There is no bowel dilatation or air-fluid level suggesting obstruction. No free air. There are multiple calcified uterine leiomyomas in the pelvis. There is degenerative change in the lumbar spine with mid lumbar dextroscoliosis. 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 Bowel gas pattern unremarkable without obstruction or free air. Fairly diffuse stool throughout colon. No lung edema or consolidation. Electronically Signed   By: Lowella Grip III M.D.   On: 06/30/2015 13:34    Microbiology: Recent Results (from the past 240 hour(s))  Urine culture     Status: Abnormal   Collection Time: 06/30/15 12:54 PM  Result Value Ref Range Status   Specimen Description URINE, RANDOM  Final   Special Requests NONE  Final   Culture MULTIPLE SPECIES PRESENT, SUGGEST RECOLLECTION (A)  Final   Report Status 07/01/2015 FINAL  Final     Labs: Basic Metabolic Panel:  Recent Labs Lab 06/30/15 1050 07/01/15 1453 07/01/15 1512 07/02/15 0739 07/03/15 0559 07/04/15 0525  NA 132* 133* 134* 137 136 138  K 4.4 3.7 3.7 4.0 3.6 3.6  CL 97* 99* 96* 104 99* 106  CO2 26 25  --  23 28 28   GLUCOSE 191* 198* 188* 220* 155* 135*  BUN 10 6 7 6 6 7   CREATININE 0.69 0.72 0.60 0.57 0.70 0.56  CALCIUM 9.0 8.6*  --  8.3* 8.6* 8.5*   Liver Function Tests:  Recent Labs Lab 07/01/15 1453  AST 19  ALT 18  ALKPHOS 46  BILITOT 0.8  PROT 6.0*  ALBUMIN 3.4*   No results for input(s): LIPASE, AMYLASE in the last 168 hours. No results for input(s): AMMONIA in the last 168 hours. CBC:  Recent Labs Lab 06/30/15 1050 07/01/15 1453 07/01/15 1512 07/02/15 0739 07/03/15 0559 07/04/15 0525  WBC 3.2* 5.1  --  5.5 5.5 4.8  NEUTROABS 1.9 3.3  --   --   --   --   HGB 12.9 12.6 14.3 11.6* 11.3* 11.0*  HCT 37.8 37.2 42.0 35.0* 33.6* 32.6*  MCV 98.2 96.9  --  96.4 95.2 98.2  PLT 235 238  --  220 221 203    Cardiac Enzymes: No results for input(s): CKTOTAL, CKMB, CKMBINDEX, TROPONINI in the last 168 hours. BNP: BNP (last 3 results) No results for input(s): BNP in the last 8760 hours.  ProBNP (last 3 results) No results for input(s): PROBNP in the last 8760 hours.  CBG:  Recent Labs Lab 07/03/15 1139 07/03/15 1525 07/03/15 1646 07/03/15 2218 07/04/15 0743  GLUCAP 189* 261* 189* 183* 127*       Signed:  Cristal Ford  [facility] 07/04/2015, 11:41 AM

## 2015-07-04 NOTE — Progress Notes (Signed)
NURSING PROGRESS NOTE  TEARAH BRINK [id number] Discharge Data: 07/04/2015 3:23 PM Attending Provider: No att. providers found MI:[id number] Antony Haste, MD     Jari Sportsman to be D/C'd Home per MD order.  Discussed with the patient the After Visit Summary and all questions fully answered. All IV's discontinued with no bleeding noted. All belongings returned to patient for patient to take home.   Last Vital Signs:  Blood pressure 115/62, pulse 70, temperature 97.6 F (36.4 C), temperature source Oral, resp. rate 18, SpO2 97 %.  Discharge Medication List   Medication List    TAKE these medications        aspirin 81 MG tablet  Take 81 mg by mouth daily.     DULoxetine 20 MG capsule  Commonly known as:  CYMBALTA  Take 40 mg by mouth daily.     Fingertip Pulse Oximeter Misc  Use as directed     Pulse Oximeter Misc  1 Device by Does not apply route as directed.     GLUCAGON EMERGENCY 1 MG injection  Generic drug:  glucagon  Inject 1 mg into the muscle once as needed.     insulin lispro 100 UNIT/ML injection  Commonly known as:  HUMALOG  Inject 2-7 Units into the skin 3 (three) times daily as needed for high blood sugar (CBG >180). Per sliding scale     levothyroxine 100 MCG tablet  Commonly known as:  SYNTHROID, LEVOTHROID  Take 100 mcg by mouth daily before breakfast.     LORazepam 0.5 MG tablet  Commonly known as:  ATIVAN  Take 0.5 mg by mouth 2 (two) times daily.     ONE TOUCH ULTRA TEST test strip  Generic drug:  glucose blood     OXYGEN  Inhale 2 L into the lungs at bedtime.     raloxifene 60 MG tablet  Commonly known as:  EVISTA  Take 60 mg by mouth at bedtime.     sodium chloride 1 g tablet  Take 1 tablet (1 g total) by mouth daily as needed.     TRESIBA FLEXTOUCH 100 UNIT/ML Sopn  Generic drug:  Insulin Degludec  Inject 8 Units into the skin at bedtime.     Vitamin D (Ergocalciferol) 50000 units Caps capsule  Commonly known as:  DRISDOL  Take 50,000  Units by mouth every Wednesday.

## 2015-07-04 NOTE — Care Management Important Message (Signed)
Important Message  Patient Details  Name: Leslie Mccann MRN: [id number] Date of Birth: June 05, 1945   Medicare Important Message Given:  Yes    Sharin Mons, RN 07/04/2015, 11:41 AM

## 2015-07-04 NOTE — Consult Note (Signed)
   [facility] CM Inpatient Consult   07/04/2015  Leslie Mccann Dec 10, 1945 [id number]  Referral received from inpatient RNCM. Patient is  70 y.o. 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 Patient has been hospitalized 3 times ([facility] and [facility]) since March for weakness, confusion. Patient has been active with [facility].  This will be a restart of services with [facility].  Consent form signed.  Patient will receive post hospital follow up and care needs will be addressed.  Inpatient RNCM updated regarding the patient/family consent to services.  For questions, please contact:  Natividad Brood, RN BSN [facility] Liaison  [phone] business mobile phone Toll free office [phone]

## 2015-07-05 ENCOUNTER — Other Ambulatory Visit: Payer: Self-pay | Admitting: *Deleted

## 2015-07-05 NOTE — Patient Outreach (Signed)
[facility]) Care Management  07/05/2015  GUYNELL ELDEN 1945-05-28 [id number]   Transition of care  RN spoke with pt's husband who indicated pt was eating and not a good time. RN offered to contact pt tomorrow both receptive and an appointment was set for 4 PM as requested by the spouse to call pt back and inquire further.  [name], RN Care Management Coordinator [place] Office [phone]

## 2015-07-06 ENCOUNTER — Ambulatory Visit: Payer: Managed Care, Other (non HMO) | Admitting: Physical Therapy

## 2015-07-06 ENCOUNTER — Other Ambulatory Visit: Payer: Self-pay | Admitting: *Deleted

## 2015-07-06 LAB — METANEPHRINES, URINE, 24 HOUR
Metaneph Total, Ur: 44 ug/L
Metanephrines, 24H Ur: 128 ug/24 hr (ref 45–290)
Normetanephrine, 24H Ur: 183 ug/24 hr (ref 82–500)
Normetanephrine, Ur: 63 ug/L
TOTAL VOLUME: 2900

## 2015-07-06 NOTE — Patient Outreach (Signed)
[facility]) Care Management  07/06/2015  Leslie Mccann Jun 06, 1945 [id number]   Transition of care   RN introduced the [facility] program/services and purpose for today's call. RN spoke with pt for some of the assessment today however pt granted permission to speak with her husband with her on speaker. RN able to completed the transition of care and discussed pt's issues related to her diabetes. RN offered community home visits to provide educational information on how to manage ongoing diabetes with health eating habits and provide printable information that maybe able to assist. Pt receptive and a home visit has been scheduled for tomorrow. Note pt reports [facility] with resume and currently OT has visited but pt continue to await PT services Leslie Mccann).  [name], RN Care Management Coordinator [place] Office [phone]

## 2015-07-07 ENCOUNTER — Encounter: Payer: Self-pay | Admitting: *Deleted

## 2015-07-07 ENCOUNTER — Other Ambulatory Visit: Payer: Self-pay | Admitting: *Deleted

## 2015-07-07 NOTE — Patient Outreach (Signed)
[facility]) Care Management   07/07/2015  Leslie Mccann [birth year] [id number]  Leslie Mccann is an 70 y.o. female  Subjective:   DM: Pt aware of some information about diabetes with 8-10 finger sticks daily instructed by her provider (Dr. Forde Dandy) to document all readings in attempts to regulate her diabetes related to her blood sugars. Pt receptive to teaching and educational material that has been provided today by this RN for case management services. Pt reports she has had diabetes for a along time(30 years) and this is the first time she has encountered issues with her diabetes.  Pt indicates she is seeking a specialist to assist with regulating her diabetes at another hospital ([facility]) and awaiting a call back to confirm an appointment possibly in June. MEDICATIONS: Pt and caregiver able to verify all medications noted in EPIC and has concerns about the sodium chloride 1 gram with instruction caregiver states provided to pt to administer 1 tab with BP standing less then 100/50 however caregiver in question being that this was the instruction provided on an admission earlier in May not this last admission. Just seeking clarification via Dr. Forde Dandy and request assistance.  MEDICAL APPOINTMENTS: Pt reports all upcoming medical appointments including pending appointment with [facility] endocrinologist in June. Pt aware to contact provider if she is unable to attend the appointment to avoid any potential fees.  No further inquires or request at this time with no community resources needed at this time.   Objective:   Review of Systems  Constitutional: Negative.   HENT: Negative.   Eyes: Negative.   Respiratory: Negative.   Cardiovascular: Negative.   Gastrointestinal: Positive for constipation.       History of constipation with medication available  Genitourinary: Negative.   Musculoskeletal: Negative.   Skin: Negative.   Neurological: Negative.   Endo/Heme/Allergies: Negative.    Psychiatric/Behavioral: Negative.     Physical Exam  Constitutional: She is oriented to person, place, and time. She appears well-developed.  HENT:  Right Ear: External ear normal.  Left Ear: External ear normal.  Eyes: EOM are normal.  Neck: Normal range of motion.  Cardiovascular: Normal heart sounds.   Respiratory: Effort normal and breath sounds normal.  GI: Soft. Bowel sounds are normal.  Musculoskeletal: Normal range of motion.  Neurological: She is alert and oriented to person, place, and time.  Skin: Skin is warm and dry.  Psychiatric: She has a normal mood and affect. Her behavior is normal. Judgment and thought content normal.    Encounter Medications:   Outpatient Encounter Prescriptions as of 07/07/2015  Medication Sig Note  . aspirin 81 MG tablet Take 81 mg by mouth daily.   . DULoxetine (CYMBALTA) 20 MG capsule Take 40 mg by mouth daily.    Marland Kitchen glucagon (GLUCAGON EMERGENCY) 1 MG injection Inject 1 mg into the muscle once as needed.   . Insulin Degludec (TRESIBA FLEXTOUCH) 100 UNIT/ML SOPN Inject 8 Units into the skin at bedtime.    . insulin lispro (HUMALOG) 100 UNIT/ML injection Inject 2-7 Units into the skin 3 (three) times daily as needed for high blood sugar (CBG >180). Per sliding scale 06/30/2015: -   . levothyroxine (SYNTHROID, LEVOTHROID) 100 MCG tablet Take 100 mcg by mouth daily before breakfast.    . LORazepam (ATIVAN) 0.5 MG tablet Take 0.5 mg by mouth 2 (two) times daily.   . Misc. Devices (FINGERTIP PULSE OXIMETER) MISC Use as directed   . Misc. Devices (PULSE OXIMETER) MISC  1 Device by Does not apply route as directed.   . ONE TOUCH ULTRA TEST test strip    . OXYGEN Inhale 2 L into the lungs at bedtime.   . raloxifene (EVISTA) 60 MG tablet Take 60 mg by mouth at bedtime.    . sodium chloride 1 g tablet Take 1 tablet (1 g total) by mouth daily as needed. (Patient taking differently: Take 1 g by mouth daily as needed (decreased BP). )   . Vitamin D,  Ergocalciferol, (DRISDOL) 50000 UNITS CAPS capsule Take 50,000 Units by mouth every Wednesday.     No facility-administered encounter medications on file as of 07/07/2015.    Functional Status:   In your present state of health, do you have any difficulty performing the following activities: 07/07/2015 07/04/2015  Hearing? N N  Vision? N N  Difficulty concentrating or making decisions? Y N  Walking or climbing stairs? Y N  Dressing or bathing? Y N  Doing errands, shopping? Y -  Conservation officer, nature and eating ? Y -  Using the Toilet? N -  In the past six months, have you accidently leaked urine? N -  Do you have problems with loss of bowel control? N -  Managing your Medications? Y -  Managing your Finances? N -  Housekeeping or managing your Housekeeping? Y -    Fall/Depression Screening:    PHQ 2/9 Scores 07/07/2015 05/23/2015 05/16/2015  PHQ - 2 Score 0 2 2  PHQ- 9 Score - 7 7  BP 110/58 mmHg  Pulse 86  Resp 20  Ht 1.676 m (5\' 6" )  Wt 121 lb 8 oz (55.112 kg)  BMI 19.62 kg/m2  SpO2 98%  Assessment:   Introduce to the [facility] program for enrollment  Case management related to diabetes Adherence related medications Adherence related to medical appointments Orthostatic hypotension  Plan:  Will complete physical assessment and confirm signed consent on pt for enrollment in to the [facility] services.  Will start education on diabetes and provide printable information with carbohydrate counting and healthy eating habits related to her diabetes. Emmi provided Diabetes-controlling your blood sugar, Diabetes sand Blood Pressure and Diabetes-when you are sick. Informative reviewed and discussed with no inquires. Provided [facility] calendar for daily documentation of all readings for providers to view. Will verify medications and pt's with sufficient supplies with no delays with her medications. Will encouraged adherence with any changes made by her provider on her discharge medications. Will verify upcoming  medical appointments and strongly encouraged pt to attend to avoid "NO SHOW" fee. Stres the importance of attending all appointments for any potential changes needed with her medications or referral/consult that maybe initiated.  Will obtain blood pressure via assessment today (110/58) and as requested by pt's caregiver completed a standing BP (90/52- with some dizziness indicated by pt). Note due to reading 22/52 RN contacted Dr. Baldwin Crown office and left a confidential voice message. Caregiver was inquired on a medication he was told to give to pt if her BP dropped 100/50 and wanted to confirm this information.  Office called back with a scheduled appointment arranged with Dr. Caryl Comes on Tuesday 6/6. Office nurse indicated Dr. Forde Dandy has informed this pt and her caregiver that he would not address this pt's inquire on the particular medication in question regarding Sodium Chloride due to Dr. Forde Dandy did not prescribed this medication in the pass and will not address this issues today. RN has informed office nurse and pt if symptoms get worse to seek  medical attention due to the initial visit pending with Dr. Caryl Comes next week (pt and caregiver verbalized an understanding).  Nurse has also indicated pt must wait a few minutes prior to retake of her BP when standing to avoid hypotension on this pt. This information has been instructed to the caregiver and the pt and a retake of the pt's BP was obtained with a readings of 100/69 with no additional symptoms reported. No further inquires on this as pt will consult Dr. Caryl Comes on next Tuesday's office visit.  Plan of care discussed with goals as pt has agreed. Will re-evaluate pt's plan of care on next month and continue ongoing transition of care over the next few weeks as agreed.  [name], RN Care Management Coordinator [place] Office [phone]

## 2015-07-08 ENCOUNTER — Encounter: Payer: Self-pay | Admitting: *Deleted

## 2015-07-12 ENCOUNTER — Telehealth: Payer: Self-pay | Admitting: Internal Medicine

## 2015-07-12 ENCOUNTER — Ambulatory Visit: Payer: Managed Care, Other (non HMO) | Admitting: Physical Therapy

## 2015-07-12 NOTE — Telephone Encounter (Signed)
Spoke with husband, and let him know he can take her to Dr Forde Dandy or the ER as we have not seen patient.  I can not make any recommendations on patient that has not been seen in our office.  She has an appointment on Thurs with Dr Caryl Comes to discuss these on going issues.  He has multiple quetions in regards to salt intake and what to do for her.  I again have asked he call Dr Forde Dandy.  He let know Dr Forde Dandy " does not believe in salt" and has told them he does not want to deal with that.

## 2015-07-12 NOTE — Telephone Encounter (Signed)
New message   Pt c/o BP issue: STAT if pt c/o blurred vision, one-sided weakness or slurred speech  1. What are your last 5 BP readings? Husband said error on bp reader means to low to read .Marland Kitchen.. 70/52 standing up   2. Are you having any other symptoms (ex. Dizziness, headache, blurred vision, passed out)?  A lot of dizziness, car sickness, very weak,  3. What is your BP issue? Blood not pumping correctly. Been going on for over three months

## 2015-07-13 ENCOUNTER — Telehealth: Payer: Self-pay | Admitting: Internal Medicine

## 2015-07-13 ENCOUNTER — Other Ambulatory Visit: Payer: Self-pay | Admitting: *Deleted

## 2015-07-13 NOTE — Telephone Encounter (Signed)
Spoke with husband, aware they will need to wait until they see dr Caryl Comes tomorrow. Agreed with the instructions given to the pt at discharge of fluids and salt tablets.

## 2015-07-13 NOTE — Patient Outreach (Signed)
[facility]) Care Management  07/13/2015  Leslie Mccann 04/25/1945 [id number]   Transition of care  RN spoke with both the pt and her caregiver spouse. Reported was pt's BP very low with her orthostatic BP. States her BP has been low for a few days now and she is pending an appointment with Dr. Caryl Comes tomorrow to address this issues. Pt reports her provider is aware of her symptoms. Pt has a home BP device and will carry the device on her office visit tomorrow for comparison on her readings. Spouse indicates he continues to give the pt the sodium tablets as prescribed on an earlier discharge.Pt denies any related symptoms at this time with her low BP.  RN encouraged pt to not increase her activities and use precautionary measures when getting up and ambulating. RN will continue to follow up next week with ongoing transition of care call and a home visit in June as scheduled.  [name], RN Care Management Coordinator [place] Office [phone]

## 2015-07-13 NOTE — Telephone Encounter (Signed)
New Message:  Pt's husband is calling in stating that she is having more frequent issues with her Orthostatic hypotension and he is concerned. He would like for her to be seen sooner than tomorrow with Dr. Caryl Comes.

## 2015-07-14 ENCOUNTER — Ambulatory Visit (INDEPENDENT_AMBULATORY_CARE_PROVIDER_SITE_OTHER): Payer: Medicare Other | Admitting: Internal Medicine

## 2015-07-14 ENCOUNTER — Encounter: Payer: Self-pay | Admitting: Internal Medicine

## 2015-07-14 VITALS — BP 130/80 | HR 88 | Ht 66.0 in | Wt 119.0 lb

## 2015-07-14 DIAGNOSIS — I951 Orthostatic hypotension: Secondary | ICD-10-CM | POA: Diagnosis not present

## 2015-07-14 MED ORDER — MIDODRINE HCL 2.5 MG PO TABS: 2.5000 mg | ORAL_TABLET | Freq: Three times a day (TID) | ORAL | Status: DC

## 2015-07-14 NOTE — Progress Notes (Signed)
ELECTROPHYSIOLOGY CONSULT NOTE  Patient ID: Leslie Mccann, MRN: [id number], DOB/AGE: [birth year] 70 y.o. Admit date: (Not on file) Date of Consult: 07/15/2015  Primary Physician: Sheela Stack, MD Primary Cardiologist: none Consulting Physician [place]  Chief Complaint: passing out    HPI Leslie Mccann is a 70 y.o. female  Referred because of orthostatic hypotension; she has a long-standing history of diabetes dating back more than 40 years.  There has been evidence of retinopathy and neuropathy and nephropathy.  About 3 months ago she began to develop symptoms of orthostatic lightheadedness occasionally accompanied by syncope. She's also had shower intolerance and heat intolerance.  She was admitted 3/17 with altered mental status and memory issues.  Her EEG at that time was abnormal. MRI evaluation had demonstrated no acute strokes. She remained somewhat confused.  Neurology notes were reviewed and were not particularly illuminating   she has a history of a? Seizure disorder. EEG 4/17 was normal. 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 Her mental status change associated with a sodium of 120. The other was normal.   Echocardiogram demonstrated normal LV function.  Past Medical History  Diagnosis Date  . Diabetes mellitus   . Depression   . Anxiety   . Retinopathy   . TIA (transient ischemic attack) 10/16/2013  . Diabetic retinopathy ([place]) 10/16/2013  . Snorings 10/16/2013  . 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 Asthma     childhood  . Tremors of nervous system       Surgical History:  Past Surgical History  Procedure Laterality Date  . Eye surgeries     . Cataract extraction, bilateral    . Colonoscopy       Home Meds: Prior to Admission medications   Medication Sig Start Date End  Date Taking? Authorizing Provider  aspirin 81 MG tablet Take 81 mg by mouth daily.    Historical Provider, MD  DULoxetine (CYMBALTA) 20 MG capsule Take 40 mg by mouth daily.     Historical Provider, MD  glucagon (GLUCAGON EMERGENCY) 1 MG injection Inject 1 mg into the muscle once as needed. As directed    Historical Provider, MD  Insulin Degludec (TRESIBA FLEXTOUCH) 100 UNIT/ML SOPN Inject 8 Units into the skin at bedtime.     Historical Provider, MD  insulin lispro (HUMALOG) 100 UNIT/ML injection Inject 2-7 Units into the skin 3 (three) times daily as needed for high blood sugar (CBG >180). Per sliding scale    Historical Provider, MD  levothyroxine (SYNTHROID, LEVOTHROID) 100 MCG tablet Take 100 mcg by mouth daily before breakfast.     Historical Provider, MD  LORazepam (ATIVAN) 0.5 MG tablet Take 0.5 mg by mouth 2 (two) times daily. 05/30/15   Historical Provider, MD  Misc. Devices (FINGERTIP PULSE OXIMETER) MISC Use as directed 01/19/14   Collene Gobble, MD  Misc. Devices (PULSE OXIMETER) MISC 1 Device by Does not apply route as directed. 01/29/14   Collene Gobble, MD  ONE TOUCH ULTRA TEST test strip  11/25/13   Historical Provider, MD  OXYGEN Inhale 2 L into the lungs at bedtime.    Historical Provider, MD  raloxifene (EVISTA) 60 MG tablet Take 60 mg by mouth at bedtime.     Historical Provider, MD  sodium chloride 1 g tablet  Take 1 g by mouth as needed. As directed    Historical Provider, MD  Vitamin D, Ergocalciferol, (DRISDOL) 50000 UNITS CAPS capsule Take 50,000 Units by mouth every Wednesday.  12/16/13   Historical Provider, MD    Allergies:  Allergies  Allergen Reactions  . Ciprofloxacin Hcl     unknown  . Codeine Nausea And Vomiting  . Sulfa Antibiotics Nausea And Vomiting  . Adhesive [Tape] Rash    pls use paper tape  . Latex Rash    Social History   Social History  . Marital Status: Married    Spouse Name: [name]  . Number of Children: 1  . Years of Education:  College   Occupational History  . Not on file.   Social History Main Topics  . Smoking status: Former Smoker -- 1.00 packs/day for 10 years    Types: Cigarettes    Quit date: 02/12/1998  . Smokeless tobacco: Never Used  . Alcohol Use: 0.0 oz/week    0 Standard drinks or equivalent per week     Comment: occassional   . Drug Use: No  . Sexual Activity: Not on file   Other Topics Concern  . Not on file   Social History Narrative   Patient is married [name]) and lives at home with her husband.   Patient has one adult child.   Patient has a college education.   Patient is right-handed.   Patient drinks 1-2 cups of coffee daily.     Family History  Problem Relation Age of Onset  . Cancer Mother   . COPD Father   . Diabetes Father   . Hypertension Maternal Grandmother   . Stroke Maternal Grandmother   . Stroke Maternal Grandfather   . Cancer Maternal Grandfather   . Hypothyroidism Daughter      ROS:  Please see the history of present illness.     All other systems reviewed and negative.    Physical Exam: Blood pressure 130/80, pulse 88, height 5\' 6"  (1.676 m), weight 119 lb (53.978 kg). General: Well developed, cachectic  female in no acute distress. Head: Normocephalic, atraumatic, sclera non-icteric, no xanthomas, nares are without discharge. EENT: normal  Lymph Nodes:  none Neck: Negative for carotid bruits. JVD not elevated. Back:without scoliosis kyphosis Lungs: Clear bilaterally to auscultation without wheezes, rales, or rhonchi. Breathing is unlabored. Heart: RRR with S1 S2.  2/6 systolic murmur . No rubs, or gallops appreciated. Abdomen: Soft, non-tender, non-distended with normoactive bowel sounds. No hepatomegaly. No rebound/guarding. No obvious abdominal masses. Msk:  Strength and tone appear normal for age. Extremities: No clubbing or cyanosis. No edema.  Distal pedal pulses are 2+ and equal bilaterally. Skin: Warm and Dry Neuro: Alert and oriented  X 3. CN III-XII intact Grossly normal sensory and motor function Speech pattern is a little bit stilted; her family says it is her normal Psych:  Responds to questions appropriately with a normal affect.      Labs: Cardiac Enzymes No results for input(s): CKTOTAL, CKMB, TROPONINI in the last 72 hours. CBC Lab Results  Component Value Date   WBC 4.8 07/04/2015   HGB 11.0* 07/04/2015   HCT 32.6* 07/04/2015   MCV 98.2 07/04/2015   PLT 203 07/04/2015   PROTIME: No results for input(s): LABPROT, INR in the last 72 hours. Chemistry No results for input(s): NA, K, CL, CO2, BUN, CREATININE, CALCIUM, PROT, BILITOT, ALKPHOS, ALT, AST, GLUCOSE in the last 168 hours.  Invalid input(s): LABALBU Lipids  Lab Results  Component Value Date   CHOL 239* 04/29/2015   HDL 92 04/29/2015   LDLCALC 138* 04/29/2015   TRIG 47 04/29/2015   BNP No results found for: PROBNP Thyroid Function Tests: No results for input(s): TSH, T4TOTAL, T3FREE, THYROIDAB in the last 72 hours.  Invalid input(s): FREET3 Miscellaneous No results found for: DDIMER  Radiology/Studies:  Dg Chest 2 View  07/01/2015  CLINICAL DATA:  Altered mental status. EXAM: CHEST  2 VIEW COMPARISON:  Jun 30, 2015. FINDINGS: The heart size and mediastinal contours are within normal limits. Both lungs are clear. No pneumothorax or pleural effusion is noted. The visualized skeletal structures are unremarkable. IMPRESSION: No active cardiopulmonary disease. Electronically Signed   By: Marijo Conception, M.D.   On: 07/01/2015 14:41   X-ray Chest Pa And Lateral  06/21/2015  CLINICAL DATA:  70 year old female with hyponatremia and syncope. EXAM: CHEST  2 VIEW COMPARISON:  Chest radiograph dated 09/21/2012 FINDINGS: Two views of the chest demonstrate emphysematous changes of the lungs with increased AP diameter of the thoracic cage. There is no focal consolidation, pleural effusion, or pneumothorax. The cardiac silhouette is within normal limits. No  acute osseous pathology. IMPRESSION: No active cardiopulmonary disease. Electronically Signed   By: Anner Crete M.D.   On: 06/21/2015 02:47   Ct Head Wo Contrast  07/01/2015  CLINICAL DATA:  Confusion/altered mental status EXAM: CT HEAD WITHOUT CONTRAST TECHNIQUE: Contiguous axial images were obtained from the base of the skull through the vertex without intravenous contrast. COMPARISON:  Head CT April 27, 2015; brain MRI April 28, 2015 FINDINGS: Age related volume loss is stable. There is no intracranial mass, hemorrhage, extra-axial fluid collection, or midline shift. Slight small vessel disease in the centra semiovale bilaterally is stable. There is no new gray-white compartment lesion. No acute infarct evident. Bony calvarium appears intact. The mastoid air cells are clear. Orbits appear symmetric bilaterally. IMPRESSION: Age related volume loss with mild periventricular small vessel disease. No intracranial mass, hemorrhage, or evidence of acute infarct. Electronically Signed   By: Lowella Grip III M.D.   On: 07/01/2015 15:09   Ct Chest W Contrast  07/02/2015  CLINICAL DATA:  Weight loss. EXAM: CT CHEST, ABDOMEN, AND PELVIS WITH CONTRAST TECHNIQUE: Multidetector CT imaging of the chest, abdomen and pelvis was performed following the standard protocol during bolus administration of intravenous contrast. CONTRAST:  1 ISOVUE-300 IOPAMIDOL (ISOVUE-300) INJECTION 61% COMPARISON:  None. FINDINGS: CT CHEST FINDINGS Mediastinum/Lymph Nodes: Heart size is normal. No pericardial effusion. Coronary artery calcifications noted. Scattered atherosclerotic changes are seen along the walls of the normal-caliber thoracic aorta. No aortic aneurysm or dissection. No mass or enlarged lymph nodes seen within the mediastinum or perihilar regions. Trachea and central bronchi are unremarkable. At least mild bronchiectasis within the peripheral lungs bilaterally. Lungs/Pleura: Mild scarring/ atelectasis at the right  lung base. Lungs are otherwise clear. No pulmonary nodule or mass. No pneumonia. No pleural effusion. Musculoskeletal: No chest wall mass or suspicious bone lesions identified. Mild degenerative change within the thoracic spine. CT ABDOMEN PELVIS FINDINGS Hepatobiliary: No masses or other significant abnormality. Pancreas: No mass, inflammatory changes, or other significant abnormality. Spleen: Within normal limits in size and appearance. Adrenals/Urinary Tract: No masses identified. No evidence of hydronephrosis. Stomach/Bowel: Bowel is normal in caliber. Fairly large amount of stool is seen throughout the colon. Stomach appears normal. Vascular/Lymphatic: Abdominal aorta is normal in caliber. No enlarged lymph nodes appreciated. Reproductive: Lobular partially calcified mass is seen within the  lower pelvis, measuring approximately 12 x 9 cm, presumably an enlarged uterus containing multiple calcified and noncalcified fibroids. Additional noncalcified mass within the presacral space of the upper pelvis measures approximately 6.2 x 6.1 cm (series 2, image 96). Other: No free fluid or abscess collection identified. No free intraperitoneal air. Musculoskeletal: Degenerative changes are seen throughout the slightly scoliotic lumbar spine, moderate in degree, with associated mild to moderate central canal stenoses at multiple levels. No acute or suspicious osseous lesion appreciated. IMPRESSION: 1. Lobular partially calcified mass within the lower pelvis, measuring approximately 12 x 9 cm, presumably an enlarged uterus containing multiple calcified and noncalcified fibroids. There is, however, a noncalcified mass within the presacral space of the upper pelvis measuring approximately 6.2 x 6.1 cm. This presacral mass could be an additional noncalcified fibroid exophytic to the upper uterus, however, concomitant neoplastic mass cannot be excluded. Recommend surgical consultation for further workup considerations, possibly  needing laparoscopy for definitive characterization. 2. Remainder of the abdomen and pelvis portion of the exam is unremarkable. Fairly large amount of stool within the colon (constipation?). 3. Unremarkable chest CT. No acute intrathoracic findings. Coronary artery calcifications. Bronchiectasis. These results will be called to the ordering clinician or representative by the Radiologist Assistant, and communication documented in the PACS or zVision Dashboard. Electronically Signed   By: Franki Cabot M.D.   On: 07/02/2015 21:53   US Pelvis Complete  07/04/2015  CLINICAL DATA:  Assess calcified fibroids.  Initial encounter. EXAM: TRANSABDOMINAL ULTRASOUND OF PELVIS TECHNIQUE: Transabdominal ultrasound examination of the pelvis was performed including evaluation of the uterus, ovaries, adnexal regions, and pelvic cul-de-sac. COMPARISON:  CT of the abdomen and pelvis from 07/02/2015 FINDINGS: Uterus Measurements: 9.7 x 6.4 x 7.8 cm. Difficult to assess due to numerous calcified fibroids, better seen on recent CT. The concerning mass noted on CT would be better characterized on laparoscopic evaluation or on contrast-enhanced MRI. Endometrium Not seen. Right ovary Measurements: 3.4 x 2.3 x 2.9 cm. Normal appearance/no adnexal mass. Left ovary Not visualized. Other findings:  No abnormal free fluid. IMPRESSION: Uterus not well assessed due to numerous calcified fibroids. The concerning mass noted on CT, at the posterior aspect of the uterus, would be better characterized on laparoscopic evaluation, or on contrast-enhanced MRI of the pelvis. Electronically Signed   By: Garald Balding M.D.   On: 07/04/2015 03:58   Ct Abdomen Pelvis W Contrast  07/02/2015  CLINICAL DATA:  Weight loss. EXAM: CT CHEST, ABDOMEN, AND PELVIS WITH CONTRAST TECHNIQUE: Multidetector CT imaging of the chest, abdomen and pelvis was performed following the standard protocol during bolus administration of intravenous contrast. CONTRAST:  1  ISOVUE-300 IOPAMIDOL (ISOVUE-300) INJECTION 61% COMPARISON:  None. FINDINGS: CT CHEST FINDINGS Mediastinum/Lymph Nodes: Heart size is normal. No pericardial effusion. Coronary artery calcifications noted. Scattered atherosclerotic changes are seen along the walls of the normal-caliber thoracic aorta. No aortic aneurysm or dissection. No mass or enlarged lymph nodes seen within the mediastinum or perihilar regions. Trachea and central bronchi are unremarkable. At least mild bronchiectasis within the peripheral lungs bilaterally. Lungs/Pleura: Mild scarring/ atelectasis at the right lung base. Lungs are otherwise clear. No pulmonary nodule or mass. No pneumonia. No pleural effusion. Musculoskeletal: No chest wall mass or suspicious bone lesions identified. Mild degenerative change within the thoracic spine. CT ABDOMEN PELVIS FINDINGS Hepatobiliary: No masses or other significant abnormality. Pancreas: No mass, inflammatory changes, or other significant abnormality. Spleen: Within normal limits in size and appearance. Adrenals/Urinary Tract: No masses identified. No evidence  of hydronephrosis. Stomach/Bowel: Bowel is normal in caliber. Fairly large amount of stool is seen throughout the colon. Stomach appears normal. Vascular/Lymphatic: Abdominal aorta is normal in caliber. No enlarged lymph nodes appreciated. Reproductive: Lobular partially calcified mass is seen within the lower pelvis, measuring approximately 12 x 9 cm, presumably an enlarged uterus containing multiple calcified and noncalcified fibroids. Additional noncalcified mass within the presacral space of the upper pelvis measures approximately 6.2 x 6.1 cm (series 2, image 96). Other: No free fluid or abscess collection identified. No free intraperitoneal air. Musculoskeletal: Degenerative changes are seen throughout the slightly scoliotic lumbar spine, moderate in degree, with associated mild to moderate central canal stenoses at multiple levels. No acute  or suspicious osseous lesion appreciated. IMPRESSION: 1. Lobular partially calcified mass within the lower pelvis, measuring approximately 12 x 9 cm, presumably an enlarged uterus containing multiple calcified and noncalcified fibroids. There is, however, a noncalcified mass within the presacral space of the upper pelvis measuring approximately 6.2 x 6.1 cm. This presacral mass could be an additional noncalcified fibroid exophytic to the upper uterus, however, concomitant neoplastic mass cannot be excluded. Recommend surgical consultation for further workup considerations, possibly needing laparoscopy for definitive characterization. 2. Remainder of the abdomen and pelvis portion of the exam is unremarkable. Fairly large amount of stool within the colon (constipation?). 3. Unremarkable chest CT. No acute intrathoracic findings. Coronary artery calcifications. Bronchiectasis. These results will be called to the ordering clinician or representative by the Radiologist Assistant, and communication documented in the PACS or zVision Dashboard. Electronically Signed   By: Franki Cabot M.D.   On: 07/02/2015 21:53   Dg Abd Acute W/chest  06/30/2015  CLINICAL DATA:  Constipation and increased urinary frequency EXAM: DG ABDOMEN ACUTE W/ 1V CHEST COMPARISON:  Chest radiograph Jun 21, 2015 FINDINGS: PA chest: No edema or consolidation. Heart size and pulmonary vascularity are normal. No adenopathy. Supine and upright abdomen: There is fairly diffuse stool throughout the colon. There is no bowel dilatation or air-fluid level suggesting obstruction. No free air. There are multiple calcified uterine leiomyomas in the pelvis. There is degenerative change in the lumbar spine with mid lumbar dextroscoliosis. 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 Bowel gas pattern unremarkable without obstruction or free air. Fairly diffuse stool throughout colon. No lung edema or consolidation. Electronically  Signed   By: Lowella Grip III M.D.   On: 06/30/2015 13:34    EKG:  Sinus  Assessment and Plan: orthosttatic hypotension  Diabetes  Recurrent hyponatremia   The patient has long-standing diabetes and profound orthostatic hypotension with a 60 mm drop in blood pressure and interestingly a concomitant 40 beats per minute increasing heart rate.  The abrupt onset of symptoms a 3 or 4 months ago either represents "the strongly camel's back" and/or a cooccuring event i.e. a viral infection that might have further disrupted her autonomic. I suspect it is the former.  In any case trying to dress the manifestations of her orthostatic intolerance and to prevent syncope is our next goal. We have discussed pharmacological and nonpharmacological strategies. As relates to the latter I suggested the use of an abdominal binder and we have discussed extensively the issue of orthostatic regulation and the value of extrinsic compression. I would refrain from sodium supplementation at this time for 2 reasons one she has supine blood pressures in the 150 range and the issue of hyponatremia needs to be sorted out.  Thus pharmacological therapies will include the use of ProAmatine started  2.5 mg 3 times a day with the idea of up titration. For the reasons mentioned above would also reports refrain from use of Florinef  She does not have symptoms to suggest systemic dysautonomia in terms of bowel function dry eyes and her dry mouth.  She is scheduled to see [facility] in the next couple of weeks. Any input that they have from neurology as relates to the above would be appreciated\\  The issue of hyponatremia confuses me. Prolonged nature of her symptoms also confuses me. Orthostatic hypotension symptoms I would anticipate with largely resolved within minutes of recumbency. Whether her remaining reserve given small vessel disease in the context of long-standing diabetes might take longer to recover is not  something with which I'm familiar but I guess is possible  The above was discussed also by telephone with her daughter  Virl Axe

## 2015-07-14 NOTE — Patient Instructions (Signed)
Medication Instructions:  Your physician has recommended you make the following change in your medication: Start proamatine 2.5 mg by mouth three times daily.     Labwork: None  Testing/Procedures: None  Follow-Up: Your physician recommends that you schedule a follow-up appointment in: July with Dr. Caryl Comes.  Wear an abdominal binder.  You can find these at a pharmacy or medical supply store.  Call our office in 10-14 days to let us know how you are doing on the new medication   Any Other Special Instructions Will Be Listed Below (If Applicable).     If you need a refill on your cardiac medications before your next appointment, please call your pharmacy.

## 2015-07-15 ENCOUNTER — Ambulatory Visit: Payer: Managed Care, Other (non HMO) | Admitting: Physical Therapy

## 2015-07-15 ENCOUNTER — Telehealth: Payer: Self-pay | Admitting: Internal Medicine

## 2015-07-15 NOTE — Telephone Encounter (Signed)
Called to make 1 month fu appt with Dr. Henrene Pastor husband very concerned with BP, even after starting new med yesterday, exercising from  a chair it's going up as well, I told him it may take a couple days for the new med to start working, and to call us Monday and let us know how she did, but he wants a nurse call today if possible because he's afraid she will end up in the hospital

## 2015-07-15 NOTE — Telephone Encounter (Signed)
Dr. Caryl Comes called and spoke with the patient's husband. The patient's husband was worried about drops in her BP that she is still experiencing today with therapy. Per Dr. Caryl Comes- stay on proamitine 2.5 mg three times a day for at least the next week. They are concerned about low sodium levels- Dr. Caryl Comes encouraged follow up with [facility] endocrinology as scheduled.

## 2015-07-19 ENCOUNTER — Ambulatory Visit: Payer: Managed Care, Other (non HMO) | Admitting: Physical Therapy

## 2015-07-20 ENCOUNTER — Other Ambulatory Visit: Payer: Self-pay | Admitting: *Deleted

## 2015-07-20 NOTE — Patient Outreach (Signed)
[facility]) Care Management  07/20/2015  Leslie Mccann 03-Dec-1945 [id number]  Transition of care  RN spoke with pt today and inquired further on her follow up appointments. Pt states he followed with the new cardiologist and was informed to stop taking the sodium chloride tablets and prescribed a new medication (Midodrine). Pt aware of the purpose of this medication and has been taking the medication over the pass week. States initially she was doing "fine" however lately her readings have become low once again when her aide takes her readings. Pt will contact the cardiologist tomorrow with all her readings for any new interventions. Pt also reports she will consult a endocrinologist on [place] later in this month to address both her diabetes and her hypotension. Reports her diabetes is okay again pending follow up with the specialist this month for more regularity. Pt reports attending all medication appointments and following the recommendation of her provider with no additional sodium chloride tablets. RN review the plan of care and verified pt continues to manage her care more independently based upon the recommendations of her providers. RN has requested to continue transition of care contacts and informed pt of the scheduled home visit for this month. Pt grateful for the follow up calls    [name], RN Care Management Coordinator [place] [facility] [phone]

## 2015-07-21 ENCOUNTER — Other Ambulatory Visit: Payer: Self-pay | Admitting: Obstetrics and Gynecology

## 2015-07-21 DIAGNOSIS — I951 Orthostatic hypotension: Secondary | ICD-10-CM

## 2015-07-21 DIAGNOSIS — R1907 Generalized intra-abdominal and pelvic swelling, mass and lump: Secondary | ICD-10-CM

## 2015-07-22 ENCOUNTER — Ambulatory Visit: Payer: Managed Care, Other (non HMO) | Admitting: Physical Therapy

## 2015-07-23 ENCOUNTER — Telehealth: Payer: Self-pay | Admitting: Physician Assistant

## 2015-07-23 NOTE — Telephone Encounter (Signed)
Leslie Mccann is a 70 y.o. female with type 1 diabetes who was recently seen by Dr. Caryl Comes for orthostatic hypotension. She also has a history of hyponatremia. She was placed on midodrine 2.5 mg 3 times a day.   Patient's husband called today. DPR on file. She has had minimal improvement with a 2.5 mg of midodrine. Today her pressure was noted to be lower and she is symptomatic with weakness and nausea. She denies syncope. She denies any infectious symptoms. Blood pressure went from 90/60 sitting to 66/49 standing. I did suggest that with her worsening blood pressure and symptoms, she should go the emergency room for further evaluation. They would like to avoid this given her several recent admissions to the hospital. They would like to try pushing fluids and being liberal with salt. They would also like to try to increase the dose of midodrine.  She can push fluids this AM and liberalize salt. She can increase midodrine to 5 mg 3 times a day.  If her blood pressure remains low and she continues to feel poorly, she should go the emergency room.  I will asked Dr. Olin Pia nurse to call her on Monday to follow-up to see if she needs any further adjustment in her medications.  Patient and her husband agree with this plan. Richardson Dopp, PA-C   07/23/2015 10:59 AM

## 2015-07-25 ENCOUNTER — Telehealth: Payer: Self-pay | Admitting: Internal Medicine

## 2015-07-25 NOTE — Telephone Encounter (Signed)
I called and spoke with the patient's husband. He states they are at [facility] and have been waiting for about 3 & 1/2 hours. She has received IV fluids. They are waiting for a physician to see her.  The patient's husband was inquiring if Dr. Caryl Comes has been able to investigate [facility] since this was mentioned as an option earlier today. I advised him that Dr. Caryl Comes has just finished his clinic for the day. I have encouraged him to let the physicians at [facility] evaluate her and her multiple issues. He is agreeable.

## 2015-07-25 NOTE — Telephone Encounter (Signed)
Per Butch Penny- clarification- the patient is "now" at [facility]. I attempted to call the patient's husband- no answer and his voice mail is full. I will try to call back.

## 2015-07-25 NOTE — Telephone Encounter (Signed)
New message      Talk to the nurse to give an update on pt.  She is not at [facility].

## 2015-07-25 NOTE — Telephone Encounter (Signed)
I called to f/u on the patient's symptoms per [facility] note. I spoke with the patient's husband.  He states she took midodrine 5 mg x 1 dose and started to have abdominal discomfort and increase urination. He reports that she took the 2.5 mg dose of midodrine as previously instructed the rest of the weekend. She has continued to have low blood pressure and weakness. He reports that the patient is scheduled for a pelvic MRI on Thursday this week to evaluate her fibroid tumors that she has had, but they can no longer see around. She is dealing with some constipation. She is scheduled to see [facility] end[facility]inology on 08/03/15 for hyponatremia, but they are not sure they can wait that long.  The patient's husband states he may try to take her to the [facility] ER to follow up as they have endocrinology services readily available. I have advise him that this may be the best plan of care for her at this time as she may be dehydrated and require IV fluids. I also advised I am unsure if the size of her fibroids may be contributing to any symptoms she may be having. I have reviewed with Dr. Caryl Comes, he recommended she also try to increase midodrine. The patient's husband state they will try this, but he is also going to try to get her to [facility]. He was appreciative of the call back.

## 2015-07-26 ENCOUNTER — Telehealth: Payer: Self-pay | Admitting: Internal Medicine

## 2015-07-26 ENCOUNTER — Ambulatory Visit: Payer: Managed Care, Other (non HMO) | Admitting: Physical Therapy

## 2015-07-26 MED ORDER — PYRIDOSTIGMINE BROMIDE 60 MG PO TABS: 30.0000 mg | ORAL_TABLET | Freq: Two times a day (BID) | ORAL | Status: DC

## 2015-07-26 NOTE — Telephone Encounter (Signed)
Pt went to ER for bladder spasms and frequent urination-feels due to Midrodine-also Sodium level was 128 -told to stop taking salt tabs- pls advise [phone]

## 2015-07-26 NOTE — Telephone Encounter (Signed)
Dr. Caryl Comes spoke with the patient's husband. Orders received to: 1) Stop midodrine 2) Start mestinon 30 mg one tablet by mouth twice daily 3) Wear the abdominal binder as instructed previously 4) Increase the HOB 4-6 inches  They are to call back next week with how she is doing.

## 2015-07-26 NOTE — Telephone Encounter (Signed)
I spoke with the patient's husband. She went to [facility] ER- she was not admitted. She was given 1 L normal saline. Orthostatics were done after this- she was 179/86->149/80->125/70. Na+ was 128. She was given a prescription for ditropan as her primary diagnosis in the ER was urinary retention/ frequency. The patient's husband is asking today if she should see renal for further evaluation. She is scheduled to follow up with endocrinology on 08/03/15. They question if the patient should up her midodrine to 5 mg twice daily. They also question if she should take ditropan.  I advised the patient I would need to review the above with Dr. Caryl Comes. They are frustrated as they report Dr. Forde Dandy is "unwilling" to help with her situation.

## 2015-07-27 ENCOUNTER — Telehealth: Payer: Self-pay | Admitting: Internal Medicine

## 2015-07-27 NOTE — Telephone Encounter (Signed)
New message      Pt c/o BP issue: STAT if pt c/o blurred vision, one-sided weakness or slurred speech  1. What are your last 5 BP readings? 110/74 sitting, 64/40 standing, 68/42 at another time 2. Are you having any other symptoms (ex. Dizziness, headache, blurred vision, passed out)? Nausea,dizziness 3. What is your BP issue?  Pt does not think the mestinon 60mg  is working.  [facility] and her sodium level was 128. Could this be making her bp low?  Please advise

## 2015-07-27 NOTE — Telephone Encounter (Signed)
Pt husband called in with below BP readings from today while from [facility] PT. He stated that he is worried about wife's sodium is low again with these low BP's. Pt is feeling fine now and is sitting. Pt has started medication suggestions from yesterday and wearing the abdominal binder but will raise the bed tonight with hopes it helps. [facility] PT has released pt from services until pt is more stable to complete therapy or orders are adjusted for pt to participate.  Pt concerns reviewed with DOD, stated that pt needs to continue with MD recommendations from yesterday. Try to increase time laying and sitting and agrees with no PT until hypotension is better controled. Pt encouraged to keep salting food and to try Gadorade. Pt diabetic, so not going to take [facility] but will follow recommendations giving more time to become theraputic. Told would forward to Dr. Caryl Comes and his nurse to review and if they have additional suggestions they will be in contact with you. Verbalized understanding, no additional questions at this time.

## 2015-07-28 ENCOUNTER — Ambulatory Visit
Admission: RE | Admit: 2015-07-28 | Discharge: 2015-07-28 | Disposition: A | Discharge status: Home / Self Care | Payer: Medicare Other | Source: Ambulatory Visit | Attending: Obstetrics and Gynecology | Admitting: Obstetrics and Gynecology

## 2015-07-28 DIAGNOSIS — R1907 Generalized intra-abdominal and pelvic swelling, mass and lump: Secondary | ICD-10-CM

## 2015-07-28 DIAGNOSIS — I951 Orthostatic hypotension: Secondary | ICD-10-CM

## 2015-07-28 MED ORDER — GADOBENATE DIMEGLUMINE 529 MG/ML IV SOLN
10.0000 mL | Freq: Once | INTRAVENOUS | Status: AC | PRN
  Administered 2015-07-28: 10 mL via INTRAVENOUS

## 2015-07-28 NOTE — Telephone Encounter (Signed)
Made pt husband aware of Dr. Olin Pia recommendations. He states that "pt feels horrible today and and that BP was unreadable this morning on the machine". He stated that she needs to have her sodium checked. Told to call Dr. Jill Poling' office (PCP) to send orders to [facility] RN to draw lab work, since they are following her care. He was very upset that they are not just regularly checking her sodium and he feels that is why she is having the problems. He was unable to raise head of bed last night as he did not have help but he did prop her up on pillows. He still feels that the Mestinon has not helped and pt is wearing abdominal binder and salting foods as orders.  He was reminded to call us back after his Endocrine appt on 6/21 and continue with current plan. He verbalized understanding, no additional questions at this time.

## 2015-07-28 NOTE — Telephone Encounter (Signed)
We spoke with the pts husband at some length the other day and asked that we do five things 1) raise hob 4-6" 2) oob and in chair all day 3) abdominal binder all day  4) continue to push fluids and salt 5) mestinon  They were to do these things and let us know next week how she is doing  We look forward to hearing from him next week after their appointment with endocrine on 6/21  Thanks

## 2015-07-29 ENCOUNTER — Other Ambulatory Visit: Payer: Self-pay | Admitting: *Deleted

## 2015-07-29 ENCOUNTER — Ambulatory Visit: Payer: Managed Care, Other (non HMO) | Admitting: Physical Therapy

## 2015-07-29 NOTE — Patient Outreach (Signed)
[facility]) Care Management  07/29/2015  SHIZUYE MESSAMORE 04-Aug-1945 [id number]   RN attempted outreach call to pt to inquire on her ongoing management of care however mailbox is full. RN also attempted outreach calls to other numbers noted in EPIC however unsuccessful. Will continue to reschedule ongoing transition of care call however RN has a home visit scheduled this month with this pt. Will continue to inquired on pt's diabetes.  [name], RN Care Management Coordinator [place] Office [phone]

## 2015-08-02 ENCOUNTER — Ambulatory Visit: Payer: Managed Care, Other (non HMO) | Admitting: Physical Therapy

## 2015-08-03 ENCOUNTER — Ambulatory Visit: Payer: Medicare Other | Admitting: Neurology

## 2015-08-03 ENCOUNTER — Encounter: Payer: Self-pay | Admitting: Physical Therapy

## 2015-08-03 NOTE — Therapy (Signed)
[facility] [address] [place], [place], [address] Phone: [phone]   Fax:  [phone]  Patient Details  Name: Leslie Mccann MRN: [id number] Date of Birth: 1945-03-18 Referring Provider:  No ref. provider found  Encounter Date: 2015-08-17       G-Codes - 2015-08-17 1139    Functional Assessment Tool Used As eval-pt did not return to therapy   Functional Limitation Mobility: Walking and moving around   Mobility: Walking and Moving Around Goal Status [phone]) At least 20 percent but less than 40 percent impaired, limited or restricted   Mobility: Walking and Moving Around Discharge Status [phone]) At least 40 percent but less than 60 percent impaired, limited or restricted      PHYSICAL THERAPY DISCHARGE SUMMARY  Visits from Start of Care: 1-eval only  Current functional level related to goals / functional outcomes: Unable to fully assess, due to pt hospitalized and did not return to PT after eval.   Remaining deficits: See eval   Education / Equipment: Unable to fully address  Plan: Patient agrees to discharge.  Patient goals were not met. Patient is being discharged due to a change in medical status.  ?????    Mady Haagensen, PT 08-17-2015 11:40 AM Phone: [phone] Fax: [phone]   Leslie Mccann. 08-17-2015, 11:39 AM  [facility] [address] [place], [place], [address] Phone: [phone]   Fax:  [phone]

## 2015-08-05 ENCOUNTER — Ambulatory Visit: Payer: Self-pay | Admitting: *Deleted

## 2015-08-05 ENCOUNTER — Ambulatory Visit: Payer: Managed Care, Other (non HMO) | Admitting: Physical Therapy

## 2015-08-08 ENCOUNTER — Telehealth: Payer: Self-pay | Admitting: Internal Medicine

## 2015-08-08 NOTE — Telephone Encounter (Signed)
New message     The husband is calling the pt is a pt at [facility] and the husband is asking Dr. Caryl Comes to give a referral for a primary care provider, so the pt can get help with the hypertension the pt is in room 8315 and the Md can call the pt at [phone].   The pt needs a primary care MD to help work with [place] and Dr. Forde Dandy and the POD's clinic. They need Dr. Caryl Comes to make a referral for a primary care physician and can release the information for the pt to a primary care doctor. The husband is reaching out to other doctors to help.

## 2015-08-08 NOTE — Telephone Encounter (Signed)
Patient's husband is requesting a PCP referral that Dr. Caryl Comes suggest ASAP, because patient is being discharged by [facility] tomorrow. Asked about patient's current PCP Dr. Forde Dandy, patient's husband states that he just handles her diabetes. Will forward to Dr. Caryl Comes for advisement.

## 2015-08-09 ENCOUNTER — Other Ambulatory Visit: Payer: Self-pay | Admitting: *Deleted

## 2015-08-09 ENCOUNTER — Ambulatory Visit: Payer: Self-pay | Admitting: *Deleted

## 2015-08-09 NOTE — Telephone Encounter (Signed)
Follow Up:   He says he is waiting to hear back from the nurse,he said she was supposed to have called back yesterday.. Please call him today asap He needs a referral for a Internal Medicine,he needs this right away. Pt is in [facility].

## 2015-08-09 NOTE — Telephone Encounter (Signed)
Reviewed with Dr. Caryl Comes- He would like for the patient's husband to speak with Dr. Forde Dandy to help co-ordinate a PCP. I have notified the patient's husband of this. He states that Dr. Forde Dandy seems to only want to handle the diabetes and anything related to endocrinology. I have advised him that this is Dr. Olin Pia recommendation that he start there. I have advised him if that does not work, then please discuss possible options with the hospital team at [facility] since the patient is still there under endocrinology services. I have asked that he ask the staff there if they are familiar with any [facility] associated PCP's in this area as I am not. He states he feels "defeated" at this point, but verbalizes understanding.

## 2015-08-09 NOTE — Patient Outreach (Signed)
[facility]) Care Management  08/09/2015  Leslie Mccann 1945-04-06 [id number]   RN spoke with pt today who indicates she is still at [facility] receiving treatment. RN offered to rescheduled today's scheduled home visit and pt agreed however does not have her schedule and request a call later this week to reschedule a home visit in July. RN will follow up accordingly.  [name], RN Care Management Coordinator [place] Office [phone]

## 2015-08-11 ENCOUNTER — Other Ambulatory Visit: Payer: Self-pay | Admitting: *Deleted

## 2015-08-11 NOTE — Patient Outreach (Signed)
[facility]) Care Management  08/11/2015  Leslie Mccann 02-24-1945 [id number]   Transition of care  Pt reports she recent was discharged from [facility] due to her fragile status of orthostatic blood pressures. Pt states she has returned home and the [facility] agency has resumed care with PT/OT and nursing. Pt reports changes in her medications which was reviewed with pt understanding. Pt reports a lot of upcoming appointments but her BP is "much better" and her diabetes is well under control now with readings from 120-130 and the most recent A1C was 7.4. Pt reports her appetite is better and her tolerance is increasing. RN offered ongoing case management services and reiterated on the initial goals. Pt receptive to the ongoing plan of care and goals however has opt not to continue the home visits but remain involved at this time for the ongoing transition of care contacts over the next 30 days. RN strongly encouraged pt to adhere with the plan of care and goals discussed today alone with the recommendations of her providers. RN also inform pt that Jerold Coombe, RN via [facility] would be contacting her next week with an ongoing transition of care call (pt receptive). RN will follow up with this pt in two weeks and inquire on pt's ongoing process in managing her care. No further inquires or request at this time.  Patient was recently discharged from hospital and all medications have been reviewed.  [name], RN Care Management Coordinator [place] Office [phone]

## 2015-08-18 ENCOUNTER — Encounter: Payer: Self-pay | Admitting: Internal Medicine

## 2015-08-18 ENCOUNTER — Other Ambulatory Visit: Payer: Self-pay | Admitting: *Deleted

## 2015-08-18 ENCOUNTER — Ambulatory Visit (INDEPENDENT_AMBULATORY_CARE_PROVIDER_SITE_OTHER): Payer: Medicare Other | Admitting: Internal Medicine

## 2015-08-18 VITALS — BP 119/74 | HR 94 | Ht 65.0 in | Wt 117.2 lb

## 2015-08-18 DIAGNOSIS — I951 Orthostatic hypotension: Secondary | ICD-10-CM | POA: Diagnosis not present

## 2015-08-18 NOTE — Addendum Note (Signed)
Addended by: Alvis Lemmings C on: 08/18/2015 03:39 PM   Modules accepted: Orders, Medications

## 2015-08-18 NOTE — Progress Notes (Signed)
Patient Care Team: Reynold Bowen, MD as PCP - General (Endocrinology) Tobi Bastos, RN as [place] Management   HPI  Leslie Mccann is a 70 y.o. female Seen in follow-up for orthostatic intolerance/hypotension occurred in the context of long-standing diabetes with multi-organ involvement.   Efforts to use Midrin or complicated by urinary retention. Mestinon apparently was not helpful. She has been using an abdominal binder with some  Benefit.  At [facility] was resumed -0.5 mg daily   She is better   BP is am are 60-70  Efforts to treat her hyponatremia and included salt and fluids although she has had a propensity towards hypertension  There was a concern about drug induced Parkinson's as well as a question of a seizure disorder related to altered mental status on presentation 3/17; antiepileptic medications have been discontinued.  Interval EEGs have been normal  Records and Results Reviewed [facility] for evaluation notable for concerns of SIADH for hyponatremia, pelvic mass , She has an appointment scheduled with the [facility] syncope clinic later this month.  Past Medical History  Diagnosis Date  . Diabetes mellitus   . Depression   . Anxiety   . Retinopathy   . TIA (transient ischemic attack) 10/16/2013  . Diabetic retinopathy ([place]) 10/16/2013  . Snorings 10/16/2013  . 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 Asthma     childhood  . Tremors of nervous system     Past Surgical History  Procedure Laterality Date  . Eye surgeries     . Cataract extraction, bilateral    . Colonoscopy      Current Outpatient Prescriptions  Medication Sig Dispense Refill  . aspirin 81 MG tablet Take 81 mg by mouth daily.    . DULoxetine (CYMBALTA) 20 MG capsule Take 40 mg by mouth daily.     . Fludrocortisone Acetate (FLORINEF PO) Take 0.5 mg by mouth daily. If BP changes when lying down to 165 will change dosage to every other day.    Marland Kitchen glucagon (GLUCAGON EMERGENCY) 1 MG  injection Inject 1 mg into the muscle once as needed. As directed    . Insulin Degludec (TRESIBA FLEXTOUCH) 100 UNIT/ML SOPN Inject 8 Units into the skin at bedtime.     . insulin lispro (HUMALOG) 100 UNIT/ML injection Inject 2-7 Units into the skin 3 (three) times daily as needed for high blood sugar (CBG >180). Per sliding scale    . levothyroxine (SYNTHROID, LEVOTHROID) 100 MCG tablet Take 100 mcg by mouth daily before breakfast.     . LORazepam (ATIVAN) 0.5 MG tablet Take 0.5 mg by mouth 2 (two) times daily.  0  . Misc. Devices (FINGERTIP PULSE OXIMETER) MISC Use as directed 1 each 0  . Misc. Devices (PULSE OXIMETER) MISC 1 Device by Does not apply route as directed. 1 each 0  . ONE TOUCH ULTRA TEST test strip   0  . OXYGEN Inhale 2 L into the lungs at bedtime.    . raloxifene (EVISTA) 60 MG tablet Take 60 mg by mouth at bedtime.     . sodium chloride 1 g tablet Take 1 g by mouth as needed. Reported on 08/11/2015    . Vitamin D, Ergocalciferol, (DRISDOL) 50000 UNITS CAPS capsule Take 50,000 Units by mouth every Wednesday. Taking only on Wednesday  0   No current facility-administered medications for this visit.    Allergies  Allergen Reactions  . Ciprofloxacin Hcl     unknown  .  Codeine Nausea And Vomiting  . Sulfa Antibiotics Nausea And Vomiting  . Adhesive [Tape] Rash    pls use paper tape  . Latex Rash      Review of Systems negative except from HPI and PMH  Physical Exam BP 119/74 mmHg  Pulse 94  Ht 5\' 5"  (1.651 m)  Wt 117 lb 3.2 oz (53.162 kg)  BMI 19.50 kg/m2  SpO2 96% Well developed and well nourished in no acute distress HENT normal E scleral and icterus clear Neck Supple JVP flat; carotids brisk and full Clear to ausculation  Regular rate and rhythm, no murmurs gallops or rub Soft with active bowel sounds No clubbing cyanosis  Edema Alert and oriented, grossly normal motor and sensory function Skin Warm and Dry    Assessment and  Plan  Orthostatic  hypotension  Diabetes mellitus  Hyponatremia   She has been better since Florinef and abdominal binder. She was intolerant of support stockings.  Her blood pressures being low on the morning we will increase her Florinef to twice daily. She is taking 0.05 mg and will make it twice a day. I anticipate follow-up labs will be done at [facility] next week.  I suggested that she try spandex compression pants that  down to the knee.

## 2015-08-18 NOTE — Patient Outreach (Signed)
Covering for assigned care manager, L. Zigmund Daniel, weekly transition of care call placed to member.  She and husband state that this is not a good time to talk, stating that she just "took her shot" and has to eat.  She also has to prepare for her follow up appointment with Dr. Caryl Comes today.  Member request for this care manager to call back later today or tomorrow.  Will attempt follow up tomorrow.  Will also provide update to assigned care manager for follow up next week.  Valente David, [place], MSN [place] [phone]

## 2015-08-18 NOTE — Patient Instructions (Signed)
Medication Instructions: - Your physician has recommended you make the following change in your medication:  1) Increase florinef to 0.5 mg twice daily  Labwork: - none  Procedures/Testing: - none  Follow-Up: - Your physician recommends that you schedule a follow-up appointment in: 6 weeks with Dr. Caryl Comes.  Any Additional Special Instructions Will Be Listed Below (If Applicable).     If you need a refill on your cardiac medications before your next appointment, please call your pharmacy.

## 2015-08-19 ENCOUNTER — Telehealth: Payer: Self-pay | Admitting: Internal Medicine

## 2015-08-19 NOTE — Telephone Encounter (Signed)
Neoma Laming nurse with [facility]-calling regarding BP running high-started increase in med yesterday pls advise laying down  172/90  sitting 152/60 standing 138/62 pls advise [phone]  If med changes

## 2015-08-19 NOTE — Telephone Encounter (Signed)
Home health nurse with [facility] calling about patient's BP being elevated, and to see if Dr. Caryl Comes wants to reduce the dose of Florinef, that was started yesterday. Patient is using compression stockings that were recommended, but not wearing the abdominal binder. Will forward to Dr. Caryl Comes for advice?  Laying    172/90 Sitting     152/60 Standing 138/62

## 2015-08-22 ENCOUNTER — Other Ambulatory Visit: Payer: Self-pay | Admitting: *Deleted

## 2015-08-22 NOTE — Telephone Encounter (Signed)
Follow-up     The husband needs to speak with the nurse regarding the Friday message about adjusting the medication. Please call the cell number provided.

## 2015-08-22 NOTE — Telephone Encounter (Signed)
**Note De-Identified  Obfuscation** The pts husband is advised of Dr Olin Pia recommendations and he verbalized understanding. He is requesting a call from Dr Caryl Comes or his nurse Nira Conn.

## 2015-08-22 NOTE — Telephone Encounter (Signed)
i would have her not use compression stockings  But would continue florinef for now, but take all of it in the am Let us know what BP are next week

## 2015-08-22 NOTE — Patient Outreach (Signed)
[facility]) Care Management  08/22/2015  Leslie Mccann [birth year] [id number]   RN spoke with pt today who recent discharged from [facility] on 6/24. Pt reports she is recovering well with improved BP with this morning's read at 135/60. Pt reports her A1C remains in good standing at 7.8 on labs last week. Pt states her Florinef has changed today with 1 mg daily instead of 0.5 mg twice daily. States a clinic visit today at [facility] with normal readings on her Sodium and Potassium levels. Pt feeling very well and getting her strength back with the ongoing assistance in the home [name] (caregiver for pt). Pt has opt to decline home visits but remains receptive to ongoing transition of care contacts and agreed to a follow up call next Monday. Will follow up accordingly.  [name], RN Care Management Coordinator [place] Office [phone]

## 2015-08-23 NOTE — Telephone Encounter (Signed)
I spoke with the patient's husband. He was clarifying the medication orders from Dr. Caryl Comes- she will take florinef 1 mg every morning. She is having low BP's in the am- I advised Mr. Marily Memos (spouse) this is why we will have her take the florinef in the morning. He reports she is having high BP's when lying- I advised Mr. Marily Memos, that we are aware of this and this was the reasoning for not initially placing her on the florinef. I have advised him to keep track the patient's BP's twice daily. I have also advised him to let us know what the readings are after at least 5 days so we can see what trends the patient is having. Mr. Marily Memos reports that the patient will be seeing the POTS clinic at [facility] on Friday this week.  Will await a call back regarding the patient's BP readings next week.

## 2015-08-23 NOTE — Telephone Encounter (Signed)
This has been forwarded to Dr Klein/Heather

## 2015-08-23 NOTE — Telephone Encounter (Signed)
I left a message for the patient's husband to call.

## 2015-08-29 ENCOUNTER — Other Ambulatory Visit: Payer: Self-pay | Admitting: *Deleted

## 2015-08-29 NOTE — Patient Outreach (Signed)
[facility]) Care Management  08/29/2015  Leslie Mccann [birth year] [id number]   Transition of care  RN spoke with pt today who indicates all her numbers are much improved with lying/sitting/standing. Pt states she saw a new specialist (postual orthostatic tachycardia HTN) with good reviews as pt continues to improve. Pt reports her CBG was slightly elevated this morning at 197 but has improved since administering her medications.  Pt indicates she is feeling much better with her medical condition and will continue to go to [facility] for her diabetic doctor until she is stable. RN offered once again further involvement but pt feels she is progress to a positive recovery. Plan of [place] goals discussed as pt continues to follow with attending all medical appointments and taking all her prescribed medications with no readmissions since her last discharge via [facility].  RN offered to continue to follow up with one addition call next week for ongoing transition of care as pt agreed. RN will follow up accordingly.  [name], RN Care Management Coordinator [place] Office [phone]

## 2015-09-09 ENCOUNTER — Other Ambulatory Visit: Payer: Self-pay | Admitting: *Deleted

## 2015-09-09 NOTE — Patient Outreach (Signed)
[facility]) Care Management  09/09/2015  Leslie Mccann Apr 26, 1945 [id number]   RN attempted outreach call today however unsuccessful. RN able to leave a HIPAA approved voice message requesting a call back. Will continue follow up call next week to inquire further on pt's progress in managing her care.   [name], RN Care Management Coordinator [place] Office [phone]

## 2015-09-13 ENCOUNTER — Encounter: Payer: Self-pay | Admitting: *Deleted

## 2015-09-13 ENCOUNTER — Other Ambulatory Visit: Payer: Self-pay | Admitting: *Deleted

## 2015-09-13 NOTE — Patient Outreach (Signed)
[facility]) Care Management  09/13/2015  ALESSANDRIA HENKEN [birth year] [id number]   RN spoke with pt today concerning her ongoing management of health.  Pt states she is doing a lot better with her diabetes stating her last A1C is not 7.7 and her provider is pleased with that readings.  States no issues with her daily CBG and she continues to attend all medical appointments with her specialist at [facility] with no problems and adjust well to changes made with her medications. Also reports her BP is good with no fainting spells or falls reported. Pt states she is eating better with support from her husband who is cooking her meals and pt states she is staying hydrated with no additional problems. RN offered once again if any additional involvement needed for community case management or telephonic follow ups as pt again declined indicating she is managing her health very well with ongoing progress. Pt very appreciative and grateful for the follow up calls over the last month and initial home visit that took place several months ago. Pt made aware that telephonic transition of care ends today however if any inquires or questions via [facility] are needed in the future to contact the [facility] office directly or her inquires. RN will notify pt's primary of the end of [facility] services today.  Case will be closed with all goals met on the plan of care.   [name], RN Care Management Coordinator [place] Office [phone]

## 2015-09-30 ENCOUNTER — Telehealth: Payer: Self-pay | Admitting: Emergency Medicine

## 2015-09-30 NOTE — Telephone Encounter (Signed)
Called and spoke with pts husband and he stated that the pt has appt with RB on Tuesday at 68 and she has an appt with endocrinologist at 1030 on Tuesday in [place].  He stated that they are moving in about 3 weeks and they cannot reschedule this appt.  He stated that they may be late, but they still want to be seen on Tuesday.  Will forward to LL to make her aware.

## 2015-10-04 ENCOUNTER — Encounter: Payer: Self-pay | Admitting: Emergency Medicine

## 2015-10-04 ENCOUNTER — Ambulatory Visit (INDEPENDENT_AMBULATORY_CARE_PROVIDER_SITE_OTHER): Payer: Medicare Other | Admitting: Emergency Medicine

## 2015-10-04 DIAGNOSIS — G4734 Idiopathic sleep related nonobstructive alveolar hypoventilation: Secondary | ICD-10-CM

## 2015-10-04 NOTE — Progress Notes (Signed)
Subjective:    Patient ID: Leslie Mccann, female    DOB: 04-20-1945, 70 y.o.   MRN: [id number]  HPI 70 yo woman, former smoker (10 pk-yrs), childhood asthma, diabetes. She was referred to Dr Brett Fairy after an episode of confusion and apraxia concerning for a TIA. She recovered to baseline after this transient episode. Her principle complaint is severe fatigue.  She underwent PSG on 10/14 that showed some evidence for periodic limb movements, no apneas, but extended period of hypoxemia with SpO2 mid-80's%. She is on several meds that can affect ventilatory drive (and fatigue) >  Abilify, xanax, ativan, cymbalta.   ROV 01/13/14 -- follow up visit for dyspnea and exertional hypoxemia.  PFT done 12/1 > no AFL, normal volumes and DLCO. She tried to come down on ativan - had to restart due to increased anxiety / depression. She is having trouble tolerating the [place] O2 due to discomfort with the device. No daytime sx.   ROV 09/21/14 -- follow up visit for nocturnal hypoxemia that we have confirmed on overnight oximetry.  She remains on the same medication regimen. She is tolerating the O2, but tells me that she has not been wearing it for the last month because her dog was ill, was scared of the machine. She plans to restart it.   ROV 10/04/15 -- Follow-up visit for patient with a former tobacco history, chronic nocturnal hypoxemia in absence of obstructive sleep apnea. We have been maintaining her on nocturnal oxygen. She has been wearing it some nights but not all.  Her husband has checked her at night while sleeping on RA, no desats on his observation.  She is no longer on abilify, she has decreased her ativan in half. 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    Review of Systems  Constitutional: Negative for fever and unexpected weight change.  HENT: Negative for congestion, dental problem, ear pain, nosebleeds, postnasal drip, rhinorrhea, sinus  pressure, sneezing, sore throat and trouble swallowing.   Eyes: Negative for redness and itching.  Respiratory: Negative for cough, chest tightness, shortness of breath and wheezing.   Cardiovascular: Negative for palpitations and leg swelling.  Gastrointestinal: Negative for nausea and vomiting.  Genitourinary: Negative for dysuria.  Musculoskeletal: Negative for joint swelling.  Skin: Negative for rash.  Neurological: Negative for headaches.  Hematological: Does not bruise/bleed easily.  Psychiatric/Behavioral: Negative for dysphoric mood. The patient is not nervous/anxious.        Objective:   Physical Exam Vitals:   10/04/15 1429  BP: 108/60  Pulse: 95  SpO2: 95%  Weight: 118 lb (53.5 kg)  Height: 5' 5.5" (1.664 m)   Gen: Pleasant, thin, in no distress,  normal affect  ENT: No lesions,  mouth clear,  oropharynx clear, no postnasal drip  Neck: No JVD, no TMG, no carotid bruits  Lungs: No use of accessory muscles, clear without rales or rhonchi  Cardiovascular: RRR, heart sounds normal, no murmur or gallops, no peripheral edema  Musculoskeletal: No deformities, no cyanosis or clubbing  Neuro: alert, non focal, mild tremor  Skin: Warm, no lesions or rashes      Assessment & Plan:  Nocturnal hypoxemia She is now off Abilify and her Ativan has been cut in half. She may no longer be desaturating at night. We will check an overnight oximetry to assess. If she no longer desaturates to we will discontinue the nocturnal oxygen.  Baltazar Apo, MD, PhD 10/04/2015, 2:48 PM  [facility] [phone] or if no answer [phone]

## 2015-10-04 NOTE — Patient Instructions (Signed)
We will perform a repeat overnight oximetry on room air to see if you still need to wear oxygen at night.  Continue to use your oxygen at 2L/min at night for now.  We will call you to let you know if we are able to stop the oxygen.  Follow with Dr Lamonte Sakai in 12 months or sooner if you have any problems

## 2015-10-04 NOTE — Assessment & Plan Note (Signed)
She is now off Abilify and her Ativan has been cut in half. She may no longer be desaturating at night. We will check an overnight oximetry to assess. If she no longer desaturates to we will discontinue the nocturnal oxygen.

## 2015-10-04 NOTE — Addendum Note (Signed)
Addended by: Desmond Dike C on: 10/04/2015 02:55 PM   Modules accepted: Orders

## 2015-10-06 ENCOUNTER — Ambulatory Visit (INDEPENDENT_AMBULATORY_CARE_PROVIDER_SITE_OTHER): Payer: Medicare Other | Admitting: Internal Medicine

## 2015-10-06 ENCOUNTER — Encounter: Payer: Self-pay | Admitting: Internal Medicine

## 2015-10-06 VITALS — BP 170/89 | HR 82 | Ht 65.0 in | Wt 117.6 lb

## 2015-10-06 DIAGNOSIS — I951 Orthostatic hypotension: Secondary | ICD-10-CM | POA: Diagnosis not present

## 2015-10-06 NOTE — Addendum Note (Signed)
Addended by: Campbell Riches on: 10/06/2015 03:02 PM   Modules accepted: Orders

## 2015-10-06 NOTE — Patient Instructions (Signed)
Medication Instructions:  Your physician recommends that you continue on your current medications as directed. Please refer to the Current Medication list given to you today.  * If you need a refill on your cardiac medications before your next appointment, please call your pharmacy.   Labwork: None ordered  Testing/Procedures: None ordered  Follow-Up: Your physician wants you to follow-up in: 6 months with Dr. Klein.  You will receive a reminder letter in the mail two months in advance. If you don't receive a letter, please call our office to schedule the follow-up appointment.  Thank you for choosing [facility]!!        

## 2015-10-06 NOTE — Progress Notes (Signed)
Patient Care Team: Reynold Bowen, MD as PCP - General (Endocrinology)   HPI  Leslie Mccann is a 71 y.o. female Seen in follow-up for orthostatic intolerance/hypotension occurred in the context of long-standing diabetes with multi-organ involvement.   Efforts to use Midrin or complicated by urinary retention. Mestinon apparently was not helpful. She has been using an abdominal binder with some  Benefit.  At [facility] was resumed -0.5 mg daily   She is better      Efforts to treat her hyponatremia and included salt and fluids although she has had a propensity towards hypertension  endocrinology notes [facility] yesterday were reviewed.  She is no longer wearing an abdominal binder because it was uncomfortable when she sat.\  She has been able to be up and about and be quite active   Past Medical History:  Diagnosis Date  . Anxiety   . Asthma    childhood  . 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 Depression   . Diabetes mellitus   . Diabetic retinopathy ([place]) 10/16/2013  . Retinopathy   . Snorings 10/16/2013  . TIA (transient ischemic attack) 10/16/2013  . Tremors of nervous system     Past Surgical History:  Procedure Laterality Date  . CATARACT EXTRACTION, BILATERAL    . COLONOSCOPY    . eye surgeries       Current Outpatient Prescriptions  Medication Sig Dispense Refill  . aspirin 81 MG tablet Take 81 mg by mouth daily.    . DULoxetine (CYMBALTA) 20 MG capsule Take 40 mg by mouth daily.     . fludrocortisone (FLORINEF) 0.1 MG tablet Take one tablet (0.1 mg) by mouth every morning    . glucagon (GLUCAGON EMERGENCY) 1 MG injection Inject 1 mg into the muscle once as needed. As directed    . Insulin Degludec (TRESIBA FLEXTOUCH) 100 UNIT/ML SOPN Inject 8 Units into the skin at bedtime.     . insulin lispro (HUMALOG) 100 UNIT/ML injection Inject 2-7 Units into the skin 3 (three) times daily as needed for high blood sugar (CBG >180). Per sliding scale    . levothyroxine  (SYNTHROID, LEVOTHROID) 100 MCG tablet Take 100 mcg by mouth daily before breakfast.     . LORazepam (ATIVAN) 0.5 MG tablet Take 0.5 mg by mouth 2 (two) times daily.  0  . Misc. Devices (FINGERTIP PULSE OXIMETER) MISC Use as directed 1 each 0  . Misc. Devices (PULSE OXIMETER) MISC 1 Device by Does not apply route as directed. 1 each 0  . ONE TOUCH ULTRA TEST test strip   0  . OXYGEN Inhale 2 L into the lungs at bedtime.    . raloxifene (EVISTA) 60 MG tablet Take 60 mg by mouth at bedtime.     . Vitamin D, Ergocalciferol, (DRISDOL) 50000 UNITS CAPS capsule Take 50,000 Units by mouth every Wednesday. Taking only on Wednesday  0   No current facility-administered medications for this visit.     Allergies  Allergen Reactions  . Ciprofloxacin Hcl     unknown  . Codeine Nausea And Vomiting  . Depakote [Divalproex Sodium]   . Keppra [Levetiracetam]   . Sulfa Antibiotics Nausea And Vomiting  . Adhesive [Tape] Rash    pls use paper tape  . Latex Rash      Review of Systems negative except from HPI and PMH  Physical Exam There were no vitals taken for this visit. Well developed and well nourished in no acute distress  HENT normal E scleral and icterus clear Neck Supple JVP flat; carotids brisk and full Clear to ausculation  Regular rate and rhythm, no murmurs gallops or rub Soft with active bowel sounds No clubbing cyanosis  Edema Alert and oriented, grossly normal motor and sensory function Skin Warm and Dry    Assessment and  Plan  Orthostatic hypotension  Diabetes mellitus  Hyponatremia   She has been better since Florinef ,  she is not wearing an abdominal binder because it rides of July. I suggested compression clothing.  We have also reviewed the physiology of pyridostigmine as well as Mitodrin;  I think the above may well help as she has a 50 mm drop in blood pressure. Furthermore, I suggested though that his systolic blood pressure [id number] which allows her to be  vertical is probably safer for her that the risks of syncope with much lower blood pressures.

## 2015-10-25 ENCOUNTER — Telehealth: Payer: Self-pay | Admitting: Emergency Medicine

## 2015-10-25 NOTE — Telephone Encounter (Signed)
Spoke with pt's husband. He is aware of ONO results. Nothing further was needed.

## 2015-10-25 NOTE — Telephone Encounter (Signed)
lmtcb x1 for pt's husband. We have a copy of pt's ONO in RB's look at.

## 2015-10-25 NOTE — Telephone Encounter (Signed)
Per SN >> pt needs to continue her night time oxygen. Her oxygen level was below 88% for 13 mins. Her lowest reading ws 80%.

## 2015-10-25 NOTE — Telephone Encounter (Signed)
Spoke with pt's husband. He wants results from pt's ONO. Will have the DOD take a look at this report.  SN - please advise. Thanks.

## 2015-11-11 ENCOUNTER — Encounter: Payer: Self-pay | Admitting: Emergency Medicine

## 2015-12-27 ENCOUNTER — Telehealth: Payer: Self-pay | Admitting: Emergency Medicine

## 2015-12-27 DIAGNOSIS — G4734 Idiopathic sleep related nonobstructive alveolar hypoventilation: Secondary | ICD-10-CM

## 2015-12-27 NOTE — Telephone Encounter (Signed)
Spoke with the pt  She moved and no longer has her o2  She is asking for [place] to send order for o2 to [place] sent and nothing further needed

## 2015-12-29 ENCOUNTER — Telehealth: Payer: Self-pay | Admitting: Emergency Medicine

## 2015-12-29 DIAGNOSIS — G4734 Idiopathic sleep related nonobstructive alveolar hypoventilation: Secondary | ICD-10-CM

## 2015-12-29 NOTE — Telephone Encounter (Signed)
Spoke with pt, states she's moved and is requesting to switch DME to [facility] in [place].    RB ok to place order?  Thanks

## 2015-12-30 NOTE — Telephone Encounter (Signed)
Yes this is OK 

## 2015-12-30 NOTE — Telephone Encounter (Signed)
lmtcb x1 for pt. 

## 2016-01-02 NOTE — Telephone Encounter (Signed)
lmtcb x2 for pt. 

## 2016-01-03 NOTE — Telephone Encounter (Signed)
lmtcb X3 

## 2016-01-04 NOTE — Telephone Encounter (Signed)
Called and spoke to pt. Informed her that the OK was given to change DME's. Order placed. Pt verbalized understanding and denied any further questions or concerns at this time.

## 2016-10-23 IMAGING — MR MR PELVIS WO/W CM
9 series · 39 of 48 positions shown · IV contrast (multihance)
Comparison: 07/02/2015 CT abdomen/ pelvis and 07/03/2015 pelvic
sonogram.

CLINICAL DATA: 69-year-old female with an enlarged myomatous uterus
with indeterminate presacral mass on recent CT study.

EXAM:
MRI PELVIS WITHOUT AND WITH CONTRAST
TECHNIQUE: Multiplanar multisequence MR imaging of the pelvis was performed
both before and after administration of intravenous contrast.
CONTRAST:  10mL MULTIHANCE GADOBENATE DIMEGLUMINE 529 MG/ML IV SOLN

[Series 2: T2 · coronal · 5.0mm · 1.25mm/px · 1 of 25 slices shown (1 of 4)]
[im 1/25]
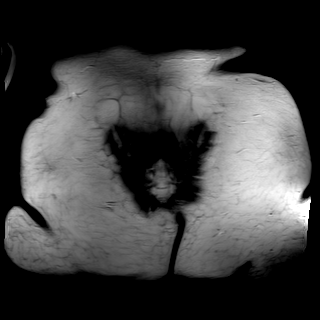

[Series 4: T2 · axial · 5.0mm · 0.41mm/px · 1 of 30 slices shown (2 of 4)]
[im 1/30]
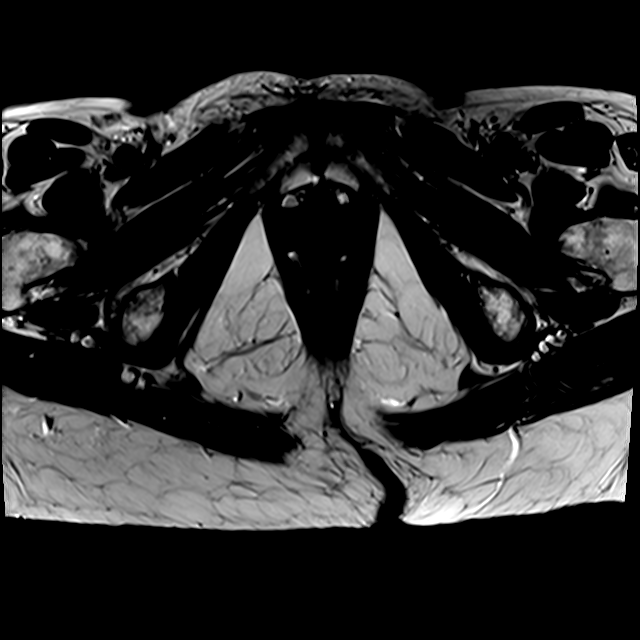

[Series 5: T2 · sagittal · 5.0mm · 0.62mm/px · 2 of 25 slices shown (3 of 4)]
[im 1/25]
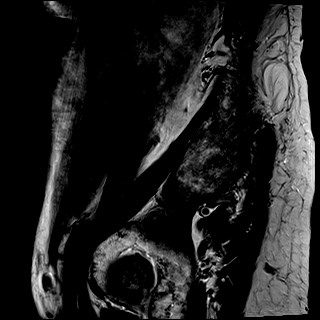
[im 25/25]
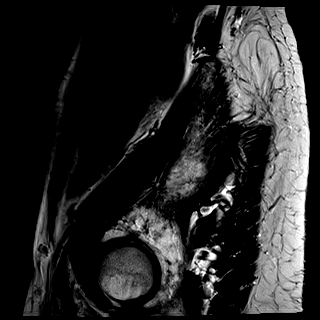

[Series 6: T2 fat-sat · axial · 5.0mm · 0.41mm/px · z∈[-91,+83]mm · 2 of 30 slices shown]
[im 1/30]
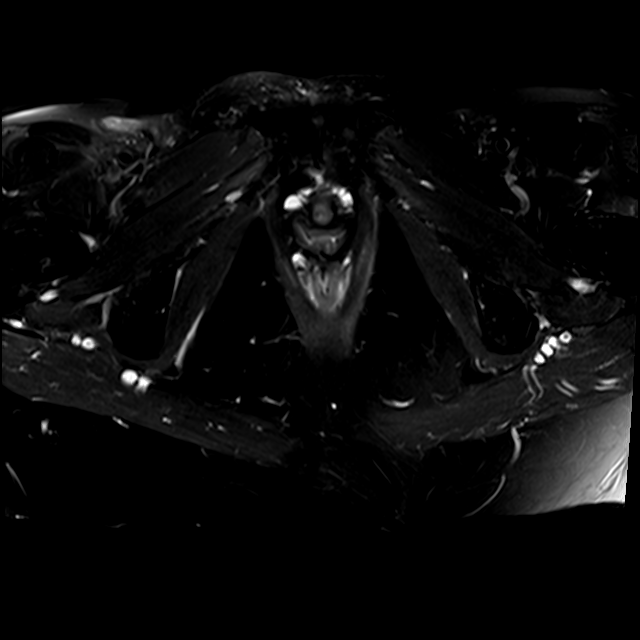
[im 30/30]
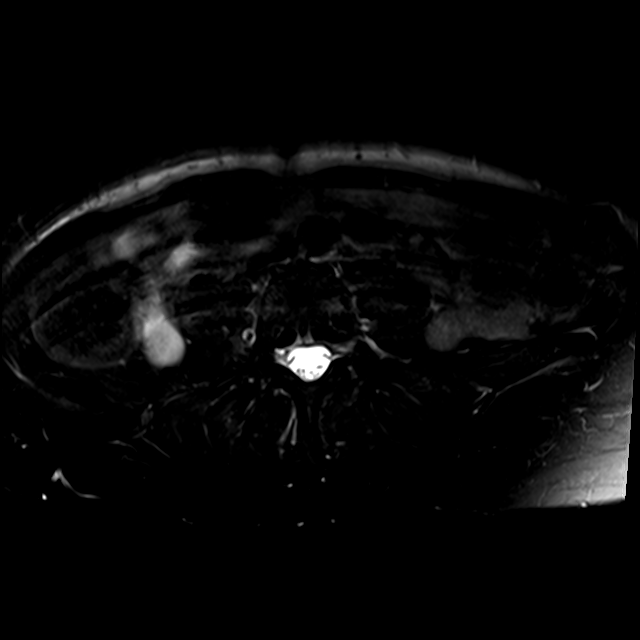

[Series 7: T1 fat-sat · axial · 1.2mm · 0.75mm/px · z∈[-89,+82]mm · 8 of 144 slices shown (1 of 2)]
[im 1/144]
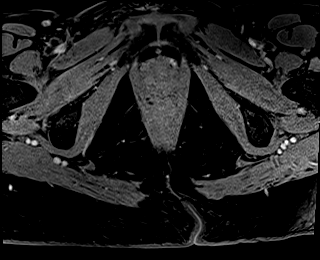
[im 16/144]
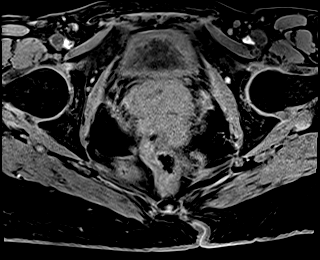
[im 48/144]
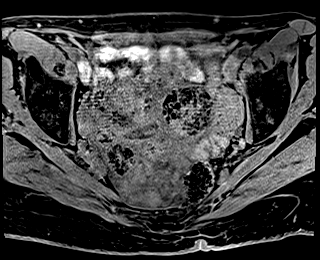
[im 64/144]
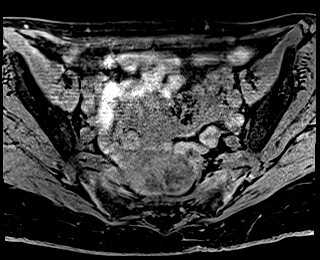
[im 80/144]
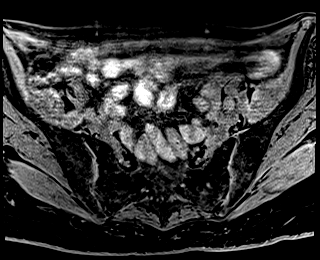
[im 96/144]
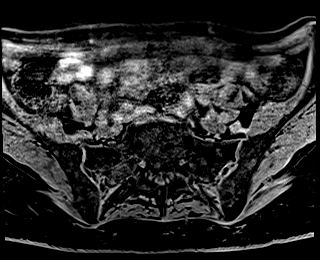
[im 128/144]
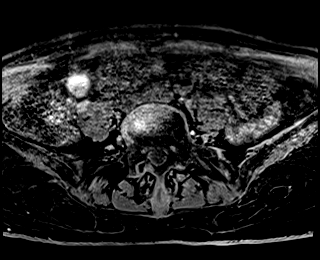
[im 144/144]
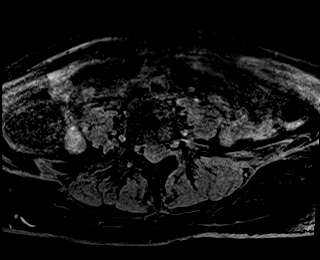

[Series 8: T1 · axial · 1.2mm · 0.75mm/px · z∈[-89,+82]mm · 8 of 144 slices shown]
[im 1/144]
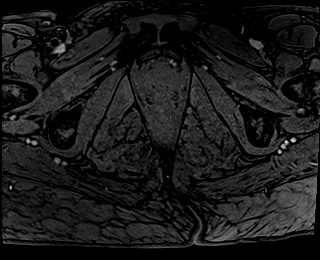
[im 16/144]
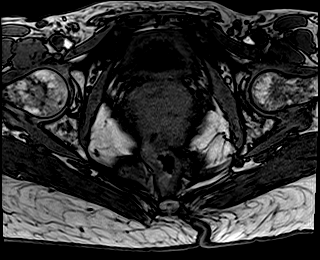
[im 48/144]
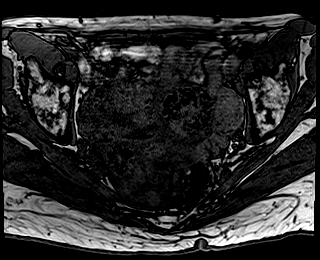
[im 64/144]
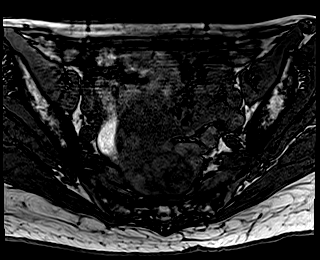
[im 80/144]
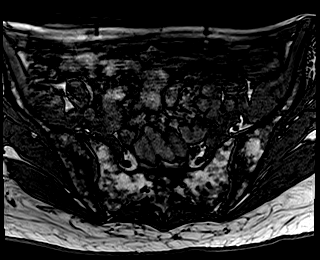
[im 96/144]
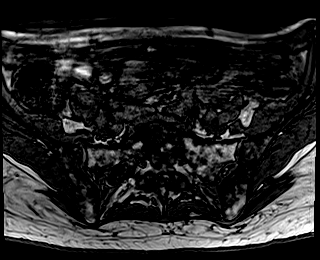
[im 128/144]
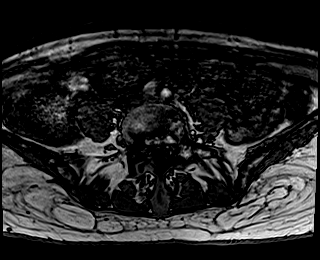
[im 144/144]
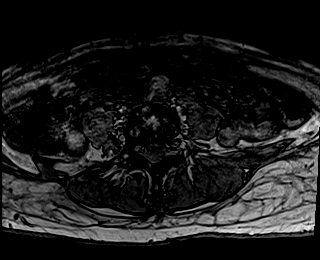

[Series 9: T1 fat-sat post-contrast · axial · 1.2mm · 0.75mm/px · z∈[-89,+82]mm · 8 of 144 slices shown]
[im 1/144]
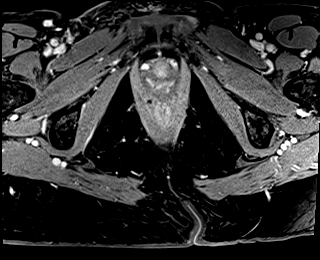
[im 16/144]
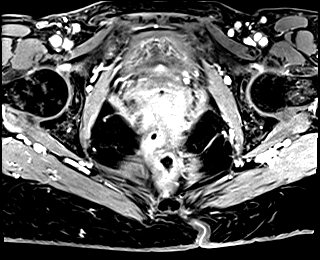
[im 48/144]
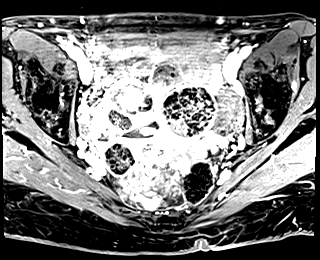
[im 64/144]
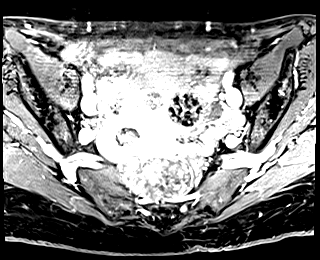
[im 80/144]
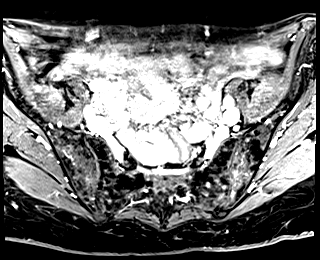
[im 96/144]
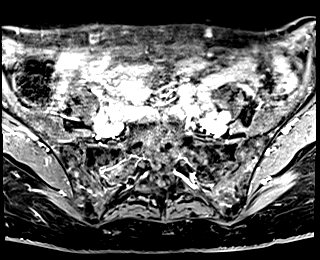
[im 128/144]
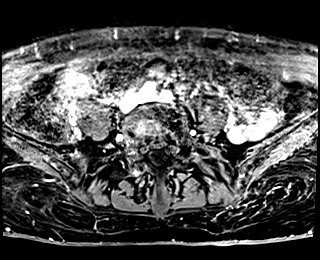
[im 144/144]
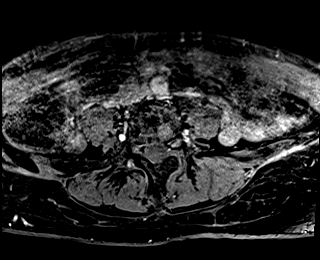

[Series 10: T1 fat-sat · coronal · 1.2mm · 0.75mm/px · 8 of 144 slices shown (2 of 2)]
[im 1/144]
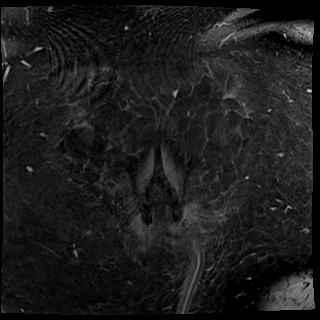
[im 16/144]
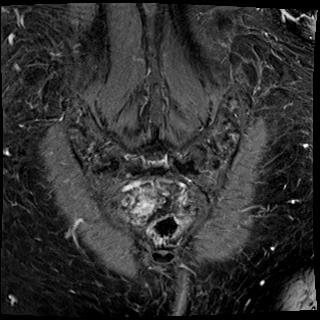
[im 48/144]
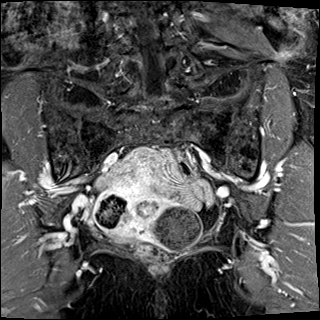
[im 64/144]
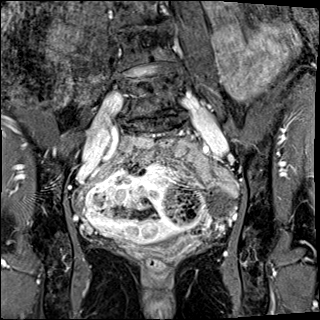
[im 80/144]
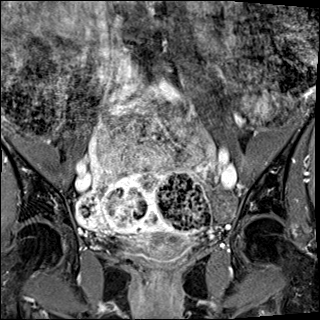
[im 96/144]
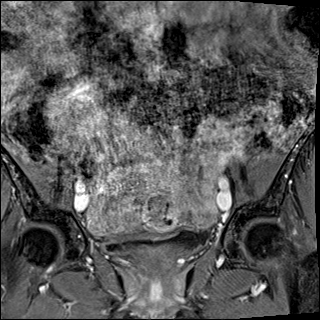
[im 128/144]
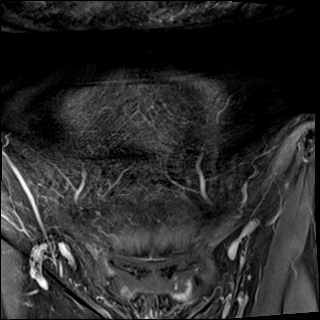
[im 144/144]
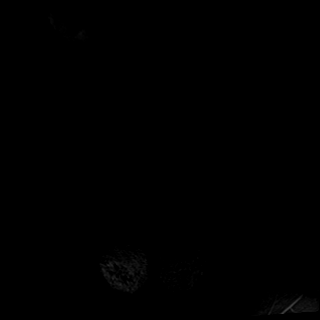

[Series 11: T2 · sagittal · 5.0mm · 0.62mm/px · 1 of 25 slices shown (4 of 4)]
[im 1/25]
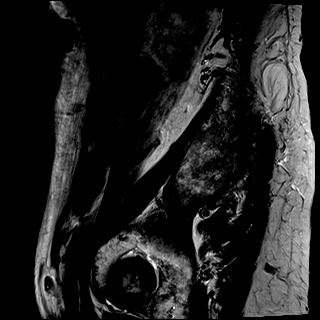

[39 of 48 positions shown; findings below may reference images not displayed]

FINDINGS: Uterus: The anteverted retroflexed uterus measures 7.7 x 7.6 x
cm, and is enlarged by innumerable (greater than 10) fibroids, which
demonstrates heterogeneous enhancement. Representative fibroids as
follows:

- left uterine body intramural 4.5 x 4.0 x 4.1 cm fibroid

- right posterior upper uterine body subserosal 2.8 x 2.5 x 2.5 cm
fibroid

- right upper uterine body 1.7 x 1.4 x 1.4 cm submucosal fibroid

There is a 5.8 x 3.4 x 6.1 cm heterogeneous enhancing mass between
the uterus and sacrum (series 5/image 11), which appears to be
connected to the right fundal uterus by a stalk (series 5/image 11),
most consistent with a pedunculated fibroid.

Inner myometrium (junctional zone) measures 5 mm in thickness, which
is within normal limits. Endometrium measures 12 mm in bilayer
thickness, which is abnormal for a postmenopausal patient. There is
a low signal 0.8 x 0.5 x 1.0 cm mass in the left uterine cavity,
most suggestive of an intracavitary fibroid (series 5/ image 11).

Ovaries and Adnexa: The right ovary measures 1.6 x 1.2 x 1.1 cm and
is normal. The left ovary measures 1.5 x 0.8 x 1.3 cm and is normal.
There are no suspicious ovarian or adnexal masses.

Bladder: Collapsed and grossly normal.  Normal visualized urethra.

Bowel: Visualized small and large bowel are normal caliber with no
bowel wall thickening.

Vascular/Lymphatic: No pathologically enlarged lymph nodes in the
pelvis.

Other: No abnormal free fluid in the pelvis. No focal pelvic fluid
collection.

Musculoskeletal: No aggressive appearing focal osseous lesions.
Degenerative disc disease in the visualized lower lumbar spine, most
prominent at L5-S1.
IMPRESSION: 1. The 6.1 cm presacral mass described on 07/02/2015 CT study is
favored to represent a pedunculated fundal uterine fibroid.
2. Enlarged myomatous uterus with innumerable (greater than 10)
uterine fibroids, including subserosal, intramural and submucosal
fibroids.
3. Low T2 signal 1.0 cm left endometrial cavity mass, favor a small
intracavitary fibroid.
4. Abnormally thickened (12 mm) endometrium for a postmenopausal
patient. Endometrial tissue sampling should be considered to exclude
endometrial carcinoma.
5. No suspicious ovarian or adnexal masses.

## 2018-07-01 ENCOUNTER — Telehealth: Payer: Self-pay

## 2018-07-01 NOTE — Telephone Encounter (Signed)
I called and left patient a message about setting up telehealth visit. Patient is on Dr. Olin Pia recall list and we have a few openings on Friday.

## 2018-07-18 NOTE — Telephone Encounter (Signed)
Patient did not call back and schedule follow up appointment. 

## 2019-04-28 ENCOUNTER — Telehealth: Payer: Self-pay

## 2019-04-28 NOTE — Telephone Encounter (Signed)
NOTES ON FILE FROM DR Arlyce Dice [phone] SENT REFERRAL TO [place]

## 2019-05-05 ENCOUNTER — Telehealth: Payer: Self-pay | Admitting: General Practice

## 2019-05-05 NOTE — Telephone Encounter (Signed)
New Message    Spoke to patient about scheduling cardiology appt. She is wanting to talk to her Primary care and then will call us back

## 2019-05-06 ENCOUNTER — Ambulatory Visit: Payer: Medicare Other | Admitting: Cardiology

## 2019-05-07 ENCOUNTER — Telehealth: Payer: Self-pay

## 2019-05-07 NOTE — Telephone Encounter (Signed)
NOTES ON FILE FROM Arlyce Dice MD [phone], SENT REFERRAL TO [facility]

## 2019-07-02 ENCOUNTER — Encounter (INDEPENDENT_AMBULATORY_CARE_PROVIDER_SITE_OTHER): Payer: Medicare Other | Admitting: Ophthalmology

## 2021-10-24 ENCOUNTER — Encounter (INDEPENDENT_AMBULATORY_CARE_PROVIDER_SITE_OTHER): Payer: Medicare Other | Admitting: Ophthalmology

## 2021-10-30 ENCOUNTER — Encounter (INDEPENDENT_AMBULATORY_CARE_PROVIDER_SITE_OTHER): Payer: Medicare Other | Admitting: Ophthalmology
